# Patient Record
Sex: Male | Born: 1945 | Race: White | Hispanic: No | Marital: Married | Smoking: Current every day smoker
Health system: Southern US, Community
[De-identification: ages and names within clinical notes are randomized; demographics above are authoritative.]

## PROBLEM LIST (undated history)

## (undated) DIAGNOSIS — A0472 Enterocolitis due to Clostridium difficile, not specified as recurrent: Secondary | ICD-10-CM

## (undated) HISTORY — PX: EYE SURGERY: SHX253

---

## 2010-03-21 HISTORY — PX: CATARACT EXTRACTION W/PHACO: SHX586

## 2010-04-18 ENCOUNTER — Ambulatory Visit (HOSPITAL_COMMUNITY)
Admission: RE | Admit: 2010-04-18 | Discharge: 2010-04-18 | Payer: Self-pay | Source: Home / Self Care | Admitting: Ophthalmology

## 2010-05-21 DEATH — deceased

## 2010-10-03 LAB — BASIC METABOLIC PANEL
BUN: 8 mg/dL (ref 6–23)
Calcium: 8.9 mg/dL (ref 8.4–10.5)
Creatinine, Ser: 1.19 mg/dL (ref 0.4–1.5)
GFR calc Af Amer: >60 mL/min (ref >60)
GFR calc non Af Amer: >60 mL/min (ref >60)

## 2010-10-03 LAB — HEMOGLOBIN AND HEMATOCRIT, BLOOD: Hemoglobin: 15.5 g/dL (ref 13.0–17.0)

## 2013-02-15 ENCOUNTER — Encounter (HOSPITAL_COMMUNITY)
Admission: RE | Admit: 2013-02-15 | Discharge: 2013-02-15 | Disposition: A | Discharge status: Home / Self Care | Payer: Medicare Other | Source: Ambulatory Visit | Attending: Ophthalmology | Admitting: Ophthalmology

## 2013-02-15 ENCOUNTER — Encounter (HOSPITAL_COMMUNITY): Payer: Self-pay

## 2013-02-15 ENCOUNTER — Other Ambulatory Visit: Payer: Self-pay

## 2013-02-15 DIAGNOSIS — Z01810 Encounter for preprocedural cardiovascular examination: Secondary | ICD-10-CM | POA: Insufficient documentation

## 2013-02-15 DIAGNOSIS — Z01812 Encounter for preprocedural laboratory examination: Secondary | ICD-10-CM | POA: Insufficient documentation

## 2013-02-15 LAB — CBC
MCH: 32.9 pg (ref 26.0–34.0)
MCHC: 33.9 g/dL (ref 30.0–36.0)
Platelets: 217 10*3/uL (ref 150–400)
RDW: 13 % (ref 11.5–15.5)

## 2013-02-15 LAB — BASIC METABOLIC PANEL
Calcium: 9.4 mg/dL (ref 8.4–10.5)
GFR calc Af Amer: 65 mL/min — ABNORMAL LOW (ref >90)
GFR calc non Af Amer: 56 mL/min — ABNORMAL LOW (ref >90)
Glucose, Bld: 107 mg/dL — ABNORMAL HIGH (ref 70–99)
Sodium: 136 mEq/L (ref 135–145)

## 2013-02-15 NOTE — Patient Instructions (Addendum)
Michael Heath  02/15/2013   Your procedure is scheduled on:  02/24/2013  Report to [facility] at 12:15 AM.  Call this number if you have problems the morning of surgery: [phone]   Remember:   Do not eat food or drink liquids after midnight.   Take these medicines the morning of surgery with A SIP OF WATER: none   Do not wear jewelry, make-up or nail polish.  Do not wear lotions, powders, or perfumes. You may wear deodorant.  Do not shave 48 hours prior to surgery. Men may shave face and neck.  Do not bring valuables to the hospital.  [facility] is not responsible                   for any belongings or valuables.  Contacts, dentures or bridgework may not be worn into surgery.  Leave suitcase in the car. After surgery it may be brought to your room.  For patients admitted to the hospital, checkout time is 11:00 AM the day of  discharge.   Patients discharged the day of surgery will not be allowed to drive  home.  Name and phone number of your driver: [name]  Special Instructions: N/A   Please read over the following fact sheets that you were given: Care and Recovery After Surgery

## 2013-02-23 MED ORDER — TETRACAINE HCL 0.5 % OP SOLN
OPHTHALMIC | Status: AC
  Filled 2013-02-23: qty 2

## 2013-02-23 MED ORDER — LIDOCAINE HCL (PF) 1 % IJ SOLN
INTRAMUSCULAR | Status: AC
  Filled 2013-02-23: qty 2

## 2013-02-23 MED ORDER — PHENYLEPHRINE HCL 2.5 % OP SOLN
OPHTHALMIC | Status: AC
  Filled 2013-02-23: qty 2

## 2013-02-23 MED ORDER — CYCLOPENTOLATE-PHENYLEPHRINE 0.2-1 % OP SOLN
OPHTHALMIC | Status: AC
  Filled 2013-02-23: qty 2

## 2013-02-23 MED ORDER — LIDOCAINE HCL 3.5 % OP GEL
OPHTHALMIC | Status: AC
  Filled 2013-02-23: qty 5

## 2013-02-24 ENCOUNTER — Ambulatory Visit (HOSPITAL_COMMUNITY): Payer: Medicare Other | Admitting: Anesthesiology

## 2013-02-24 ENCOUNTER — Encounter (HOSPITAL_COMMUNITY)
Admission: RE | Disposition: A | Discharge status: Home Health | Payer: Self-pay | Source: Ambulatory Visit | Attending: Ophthalmology

## 2013-02-24 ENCOUNTER — Ambulatory Visit (HOSPITAL_COMMUNITY)
Admission: RE | Admit: 2013-02-24 | Discharge: 2013-02-24 | Disposition: A | Discharge status: Home Health | Payer: Medicare Other | Source: Ambulatory Visit | Attending: Ophthalmology | Admitting: Ophthalmology

## 2013-02-24 ENCOUNTER — Encounter (HOSPITAL_COMMUNITY): Payer: Self-pay | Admitting: *Deleted

## 2013-02-24 ENCOUNTER — Encounter (HOSPITAL_COMMUNITY): Payer: Self-pay | Admitting: Anesthesiology

## 2013-02-24 DIAGNOSIS — H52229 Regular astigmatism, unspecified eye: Secondary | ICD-10-CM | POA: Insufficient documentation

## 2013-02-24 DIAGNOSIS — H2510 Age-related nuclear cataract, unspecified eye: Secondary | ICD-10-CM | POA: Insufficient documentation

## 2013-02-24 HISTORY — PX: CATARACT EXTRACTION W/PHACO: SHX586

## 2013-02-24 SURGERY — PHACOEMULSIFICATION, CATARACT, WITH IOL INSERTION
Anesthesia: Monitor Anesthesia Care | Site: Eye | Laterality: Left | Wound class: Clean

## 2013-02-24 MED ORDER — MIDAZOLAM HCL 2 MG/2ML IJ SOLN
1.0000 mg | INTRAMUSCULAR | Status: DC | PRN
  Administered 2013-02-24: 2 mg via INTRAVENOUS

## 2013-02-24 MED ORDER — EPINEPHRINE HCL 1 MG/ML IJ SOLN
INTRAOCULAR | Status: DC | PRN
  Administered 2013-02-24: 14:00:00

## 2013-02-24 MED ORDER — NEOMYCIN-POLYMYXIN-DEXAMETH 3.5-10000-0.1 OP OINT
TOPICAL_OINTMENT | OPHTHALMIC | Status: AC
  Filled 2013-02-24: qty 3.5

## 2013-02-24 MED ORDER — POVIDONE-IODINE 5 % OP SOLN
OPHTHALMIC | Status: DC | PRN
  Administered 2013-02-24: 1 via OPHTHALMIC

## 2013-02-24 MED ORDER — FENTANYL CITRATE 0.05 MG/ML IJ SOLN
25.0000 ug | INTRAMUSCULAR | Status: AC
  Administered 2013-02-24 (×2): 25 ug via INTRAVENOUS

## 2013-02-24 MED ORDER — MIDAZOLAM HCL 2 MG/2ML IJ SOLN
INTRAMUSCULAR | Status: AC
  Filled 2013-02-24: qty 2

## 2013-02-24 MED ORDER — NEOMYCIN-POLYMYXIN-DEXAMETH 0.1 % OP OINT
TOPICAL_OINTMENT | OPHTHALMIC | Status: DC | PRN
  Administered 2013-02-24: 1 via OPHTHALMIC

## 2013-02-24 MED ORDER — CYCLOPENTOLATE-PHENYLEPHRINE 0.2-1 % OP SOLN
1.0000 [drp] | OPHTHALMIC | Status: AC
  Administered 2013-02-24 (×2): 1 [drp] via OPHTHALMIC

## 2013-02-24 MED ORDER — TETRACAINE HCL 0.5 % OP SOLN
OPHTHALMIC | Status: DC | PRN
  Administered 2013-02-24: 2 [drp] via OPHTHALMIC

## 2013-02-24 MED ORDER — LACTATED RINGERS IV SOLN
INTRAVENOUS | Status: DC
  Administered 2013-02-24: 13:00:00 via INTRAVENOUS

## 2013-02-24 MED ORDER — FENTANYL CITRATE 0.05 MG/ML IJ SOLN
INTRAMUSCULAR | Status: AC
  Filled 2013-02-24: qty 2

## 2013-02-24 MED ORDER — LIDOCAINE HCL (PF) 1 % IJ SOLN
INTRAMUSCULAR | Status: DC | PRN
  Administered 2013-02-24: .4 mL

## 2013-02-24 MED ORDER — BSS IO SOLN
INTRAOCULAR | Status: DC | PRN
  Administered 2013-02-24: 15 mL via INTRAOCULAR

## 2013-02-24 MED ORDER — EPINEPHRINE HCL 1 MG/ML IJ SOLN
INTRAMUSCULAR | Status: AC
  Filled 2013-02-24: qty 1

## 2013-02-24 MED ORDER — PROVISC 10 MG/ML IO SOLN
INTRAOCULAR | Status: DC | PRN
  Administered 2013-02-24: 8.5 mg via INTRAOCULAR

## 2013-02-24 MED ORDER — PHENYLEPHRINE HCL 2.5 % OP SOLN
1.0000 [drp] | OPHTHALMIC | Status: AC
  Administered 2013-02-24 (×3): 1 [drp] via OPHTHALMIC

## 2013-02-24 MED ORDER — LIDOCAINE HCL 3.5 % OP GEL: 1.0000 "application " | OPHTHALMIC | Status: AC

## 2013-02-24 MED ORDER — TETRACAINE HCL 0.5 % OP SOLN
1.0000 [drp] | OPHTHALMIC | Status: AC
  Administered 2013-02-24 (×3): 1 [drp] via OPHTHALMIC

## 2013-02-24 SURGICAL SUPPLY — 32 items
CAPSULAR TENSION RING-AMO (OPHTHALMIC RELATED) IMPLANT
CLOTH BEACON ORANGE TIMEOUT ST (SAFETY) ×2 IMPLANT
EYE SHIELD UNIVERSAL CLEAR (GAUZE/BANDAGES/DRESSINGS) ×2 IMPLANT
GLOVE BIO SURGEON STRL SZ 6.5 (GLOVE) IMPLANT
GLOVE BIOGEL PI IND STRL 6.5 (GLOVE) ×1 IMPLANT
GLOVE BIOGEL PI IND STRL 7.0 (GLOVE) IMPLANT
GLOVE BIOGEL PI IND STRL 7.5 (GLOVE) IMPLANT
GLOVE BIOGEL PI INDICATOR 6.5 (GLOVE) ×1
GLOVE BIOGEL PI INDICATOR 7.0 (GLOVE)
GLOVE BIOGEL PI INDICATOR 7.5 (GLOVE)
GLOVE ECLIPSE 6.5 STRL STRAW (GLOVE) IMPLANT
GLOVE ECLIPSE 7.0 STRL STRAW (GLOVE) IMPLANT
GLOVE ECLIPSE 7.5 STRL STRAW (GLOVE) IMPLANT
GLOVE EXAM NITRILE LRG STRL (GLOVE) IMPLANT
GLOVE EXAM NITRILE MD LF STRL (GLOVE) ×2 IMPLANT
GLOVE SKINSENSE NS SZ6.5 (GLOVE)
GLOVE SKINSENSE NS SZ7.0 (GLOVE)
GLOVE SKINSENSE STRL SZ6.5 (GLOVE) IMPLANT
GLOVE SKINSENSE STRL SZ7.0 (GLOVE) IMPLANT
KIT VITRECTOMY (OPHTHALMIC RELATED) IMPLANT
LENS SIGHTPATH STAAR TORIC ×2 IMPLANT
PAD ARMBOARD 7.5X6 YLW CONV (MISCELLANEOUS) ×2 IMPLANT
PROC W NO LENS (INTRAOCULAR LENS)
PROC W SPEC LENS (INTRAOCULAR LENS) ×2
PROCESS W NO LENS (INTRAOCULAR LENS) IMPLANT
PROCESS W SPEC LENS (INTRAOCULAR LENS) ×1 IMPLANT
RING MALYGIN (MISCELLANEOUS) IMPLANT
SYR TB 1ML LL NO SAFETY (SYRINGE) ×2 IMPLANT
TAPE SURG TRANSPORE 1 IN (GAUZE/BANDAGES/DRESSINGS) ×1 IMPLANT
TAPE SURGICAL TRANSPORE 1 IN (GAUZE/BANDAGES/DRESSINGS) ×1
VISCOELASTIC ADDITIONAL (OPHTHALMIC RELATED) IMPLANT
WATER STERILE IRR 250ML POUR (IV SOLUTION) ×2 IMPLANT

## 2013-02-24 NOTE — Anesthesia Postprocedure Evaluation (Signed)
  Anesthesia Post-op Note  Patient: Michael Heath  Procedure(s) Performed: Procedure(s) (LRB): CATARACT EXTRACTION PHACO AND INTRAOCULAR LENS PLACEMENT (IOC) (Left)  Patient Location:  Short Stay  Anesthesia Type: MAC  Level of Consciousness: awake  Airway and Oxygen Therapy: Patient Spontanous Breathing  Post-op Pain: none  Post-op Assessment: Post-op Vital signs reviewed, Patient's Cardiovascular Status Stable, Respiratory Function Stable, Patent Airway, No signs of Nausea or vomiting and Pain level controlled  Post-op Vital Signs: Reviewed and stable  Complications: No apparent anesthesia complications

## 2013-02-24 NOTE — Op Note (Signed)
Date of Admission: 02/24/2013  Date of Surgery: 02/24/2013  Pre-Op Dx: Cataract  Right  Eye  Post-Op Dx: Cataract  Right  Eye,  Dx Code 366.16, astigmatism Dx code 2790071807  Surgeon: Gemma Payor, M.D.  Assistants: None  Anesthesia: Topical with MAC  Indications: Painless, progressive loss of vision with compromise of daily activities.  Surgery: Cataract Extraction with Intraocular lens Implant Right Eye  Discription: The patient had dilating drops and viscous lidocaine placed into the left eye in the pre-op holding area. After transfer to the operating room, a time out was performed. The patient was then prepped and draped. Beginning with a 75 degree blade a paracentesis port was made at the surgeon's 2 o'clock position. The anterior chamber was then filled with 1% non-preserved lidocaine. This was followed by filling the anterior chamber with Provisc. A bent cystatome needle was used to create a continuous tear capsulotomy. Hydrodissection was performed with balanced salt solution on a Fine canula. The lens nucleus was then removed using the phacoemulsification handpiece. Residual cortex was removed with the I&A handpiece. The anterior chamber and capsular bag were refilled with Provisc. A posterior chamber intraocular lens was placed into the capsular bag with it's injector. The implant was positioned with the Kuglan hook. The Provisc was then removed from the anterior chamber and capsular bag with the I&A handpiece. Stromal hydration of the main incision and paracentesis port was performed with BSS on a Fine canula. The wounds were tested for leak which was negative. The patient tolerated the procedure well. There were no operative complications. The patient was then transferred to the recovery room in stable condition.  Complications: None  Specimen: None  EBL: None  Prosthetic device: STAAR Toric [id number], power 16.0SE/+2.0, SN [id number].

## 2013-02-24 NOTE — H&P (Signed)
I have reviewed the H&P, the patient was re-examined, and I have identified no interval changes in medical condition and plan of care since the history and physical of record  

## 2013-02-24 NOTE — Anesthesia Procedure Notes (Signed)
Procedure Name: MAC Date/Time: 02/24/2013 1:30 PM Performed by: Franco Nones Pre-anesthesia Checklist: Patient identified, Emergency Drugs available, Suction available, Timeout performed and Patient being monitored Patient Re-evaluated:Patient Re-evaluated prior to inductionOxygen Delivery Method: Nasal Cannula

## 2013-02-24 NOTE — Anesthesia Preprocedure Evaluation (Signed)
Anesthesia Evaluation  Patient identified by MRN, date of birth, ID band Patient awake    Reviewed: Allergy & Precautions, H&P , NPO status , Patient's Chart, lab work & pertinent test results  Airway Mallampati: III      Dental  (+) Teeth Intact   Pulmonary Current Smoker (am cough),  breath sounds clear to auscultation        Cardiovascular negative cardio ROS  Rhythm:Regular Rate:Normal     Neuro/Psych    GI/Hepatic negative GI ROS,   Endo/Other    Renal/GU      Musculoskeletal   Abdominal   Peds  Hematology   Anesthesia Other Findings   Reproductive/Obstetrics                           Anesthesia Physical Anesthesia Plan  ASA: II  Anesthesia Plan: MAC   Post-op Pain Management:    Induction: Intravenous  Airway Management Planned: Nasal Cannula  Additional Equipment:   Intra-op Plan:   Post-operative Plan:   Informed Consent: I have reviewed the patients History and Physical, chart, labs and discussed the procedure including the risks, benefits and alternatives for the proposed anesthesia with the patient or authorized representative who has indicated his/her understanding and acceptance.     Plan Discussed with:   Anesthesia Plan Comments:         Anesthesia Quick Evaluation

## 2013-02-24 NOTE — Transfer of Care (Signed)
Immediate Anesthesia Transfer of Care Note  Patient: Michael Heath  Procedure(s) Performed: Procedure(s) (LRB): CATARACT EXTRACTION PHACO AND INTRAOCULAR LENS PLACEMENT (IOC) (Left)  Patient Location: Shortstay  Anesthesia Type: MAC  Level of Consciousness: awake  Airway & Oxygen Therapy: Patient Spontanous Breathing   Post-op Assessment: Report given to PACU RN, Post -op Vital signs reviewed and stable and Patient moving all extremities  Post vital signs: Reviewed and stable  Complications: No apparent anesthesia complications

## 2013-02-28 ENCOUNTER — Encounter (HOSPITAL_COMMUNITY): Payer: Self-pay | Admitting: Ophthalmology

## 2014-10-20 DEATH — deceased

## 2015-12-10 DIAGNOSIS — H5202 Hypermetropia, left eye: Secondary | ICD-10-CM | POA: Diagnosis not present

## 2015-12-10 DIAGNOSIS — H524 Presbyopia: Secondary | ICD-10-CM | POA: Diagnosis not present

## 2015-12-10 DIAGNOSIS — Z961 Presence of intraocular lens: Secondary | ICD-10-CM | POA: Diagnosis not present

## 2015-12-10 DIAGNOSIS — H52223 Regular astigmatism, bilateral: Secondary | ICD-10-CM | POA: Diagnosis not present

## 2015-12-25 ENCOUNTER — Encounter: Payer: Self-pay | Admitting: Family Medicine

## 2015-12-25 ENCOUNTER — Ambulatory Visit (INDEPENDENT_AMBULATORY_CARE_PROVIDER_SITE_OTHER): Payer: Medicare Other | Admitting: Family Medicine

## 2015-12-25 VITALS — BP 153/98 | HR 64 | Temp 97.0°F | Ht 73.0 in | Wt 221.0 lb

## 2015-12-25 DIAGNOSIS — Z0000 Encounter for general adult medical examination without abnormal findings: Secondary | ICD-10-CM

## 2015-12-25 DIAGNOSIS — R030 Elevated blood-pressure reading, without diagnosis of hypertension: Secondary | ICD-10-CM | POA: Insufficient documentation

## 2015-12-25 DIAGNOSIS — IMO0001 Reserved for inherently not codable concepts without codable children: Secondary | ICD-10-CM

## 2015-12-25 DIAGNOSIS — E669 Obesity, unspecified: Secondary | ICD-10-CM

## 2015-12-25 NOTE — Patient Instructions (Signed)
Great to see you!  Come back in 6 months unless we let you know otherwise  We will call within 1 week with labs

## 2015-12-25 NOTE — Progress Notes (Signed)
Subjective:    Michael Heath is a 70 y.o. male who presents for Medicare Annual/Subsequent preventive examination.   Preventive Screening-Counseling & Management  Tobacco History  Smoking status  . Current Every Day Smoker -- 1.50 packs/day for 55 years  . Types: Cigarettes  Smokeless tobacco  . Current User    Problems Prior to Visit 1. See problem list  Current Problems (verified) Patient Active Problem List   Diagnosis Date Noted  . Elevated blood pressure 12/25/2015    Medications Prior to Visit No current outpatient prescriptions on file prior to visit.   No current facility-administered medications on file prior to visit.    Current Medications (verified) No current outpatient prescriptions on file.   No current facility-administered medications for this visit.     Allergies (verified) Review of patient's allergies indicates no known allergies.   PAST HISTORY  Family History History reviewed. No pertinent family history.  Social History Social History  Substance Use Topics  . Smoking status: Current Every Day Smoker -- 1.50 packs/day for 55 years    Types: Cigarettes  . Smokeless tobacco: Current User  . Alcohol Use: No    Are there smokers in your home (other than you)?  Yes  Risk Factors Current exercise habits: walk 2-3 times per week , 20 minutes  Dietary issues discussed: Difficulty swallowing ceartain foods and ceartain spices  Cardiac risk factors: advanced age (older than 96 for men, 56 for women), male gender and smoking/ tobacco exposure.  Depression Screen (Note: if answer to either of the following is "Yes", a more complete depression screening is indicated)   Q1: Over the past two weeks, have you felt down, depressed or hopeless? No  Q2: Over the past two weeks, have you felt little interest or pleasure in doing things? No  Have you lost interest or pleasure in daily life? No  Do you often feel hopeless? No  Do you cry easily  over simple problems? No  Activities of Daily Living In your present state of health, do you have any difficulty performing the following activities?:  Driving? No Managing money?  No Feeding yourself? No Getting from bed to chair? No Climbing a flight of stairs? No Preparing food and eating?: No Bathing or showering? No Getting dressed: No Getting to the toilet? No Using the toilet:No Moving around from place to place: No In the past year have you fallen or had a near fall?:No   Are you sexually active?  No  Do you have more than one partner?  No  Hearing Difficulties: No Do you often ask people to speak up or repeat themselves? No Do you experience ringing or noises in your ears? No Do you have difficulty understanding soft or whispered voices? No   Do you feel that you have a problem with memory? No  Do you often misplace items? yes  Do you feel safe at home?  Yes  Cognitive Testing  Alert? Yes  Normal Appearance?Yes  Oriented to person? Yes  Place? Yes   Time? Yes  Recall of three objects?  Yes  Can perform simple calculations? Yes  Displays appropriate judgment?Yes  Can read the correct time from a watch face?Yes   Advanced Directives have been discussed with the patient? Yes   List the Names of Other Physician/Practitioners you currently use: 1.  [facility]  Indicate any recent Medical Services you may have received from other than [facility] providers in the past year (  date may be approximate).   There is no immunization history on file for this patient.  Screening Tests Health Maintenance  Topic Date Due  . Hepatitis C Screening  [birth year]  . TETANUS/TDAP  07/10/1964  . COLONOSCOPY  07/11/1995  . ZOSTAVAX  07/10/2005  . PNA vac Low Risk Adult (1 of 2 - PCV13) 07/10/2010  . INFLUENZA VACCINE  02/19/2016    All answers were reviewed with the patient and necessary referrals were made:  Kevin FentonSamuel Deloris Moger, MD   12/25/2015   History  reviewed: allergies, current medications, past family history, past medical history, past social history, past surgical history and problem list  Review of Systems Pertinent items are noted in HPI.    Objective:      Blood pressure 153/98, pulse 64, temperature 97 F (36.1 C), temperature source Oral, height 6\' 1"  (1.854 m), weight 221 lb (100.245 kg). Body mass index is 29.16 kg/(m^2).  Gen: NAD, alert, cooperative with exam HEENT: NCAT, EOMI, PERRL CV: RRR, good S1/S2, no murmur Resp: CTABL, no wheezes, non-labored  Ext: No edema, warm Neuro: Alert and oriented, No gross deficits      Assessment:     Michael Heath is a very pleasant 70 year old male with elevated blood pressure and no diagnosis of hypertension. His BMI is borderline obesity, technically overweight  We discussed diet and exercise. I recommended routine blood work today, he appears to be in very good health and has plenty of potential life span to warrant the use of statins if he were found to have high cholesterol.  He would like to defer colonoscopy for now, FOBT card is been given. He also declines pneumonia vaccination.  He is not fasting today      Plan:     During the course of the visit the patient was educated and counseled about appropriate screening and preventive services including:    Smoking cessation counseling  Diet review for nutrition referral? Yes ____  Not Indicated __X   Patient Instructions (the written plan) was given to the patient.  Medicare Attestation I have personally reviewed: The patient's medical and social history Their use of alcohol, tobacco or illicit drugs Their current medications and supplements The patient's functional ability including ADLs,fall risks, home safety risks, cognitive, and hearing and visual impairment Diet and physical activities Evidence for depression or mood disorders  The patient's weight, height, BMI, and visual acuity have been recorded  in the chart.  I have made referrals, counseling, and provided education to the patient based on review of the above and I have provided the patient with a written personalized care plan for preventive services.     Kevin FentonSamuel Areyanna Figeroa, MD   12/25/2015

## 2015-12-26 LAB — CMP14+EGFR
A/G RATIO: 1.2 (ref 1.2–2.2)
ALBUMIN: 4.2 g/dL (ref 3.5–4.8)
ALT: 21 IU/L (ref 0–44)
AST: 19 IU/L (ref 0–40)
Alkaline Phosphatase: 67 IU/L (ref 39–117)
BUN / CREAT RATIO: 10 (ref 10–24)
BUN: 13 mg/dL (ref 8–27)
Bilirubin Total: 0.4 mg/dL (ref 0.0–1.2)
CALCIUM: 9.4 mg/dL (ref 8.6–10.2)
CO2: 24 mmol/L (ref 18–29)
Chloride: 98 mmol/L (ref 96–106)
Creatinine, Ser: 1.32 mg/dL — ABNORMAL HIGH (ref 0.76–1.27)
GFR, EST AFRICAN AMERICAN: 63 mL/min/{1.73_m2} (ref >59)
GFR, EST NON AFRICAN AMERICAN: 54 mL/min/{1.73_m2} — AB (ref >59)
GLOBULIN, TOTAL: 3.6 g/dL (ref 1.5–4.5)
Glucose: 99 mg/dL (ref 65–99)
POTASSIUM: 4.3 mmol/L (ref 3.5–5.2)
SODIUM: 136 mmol/L (ref 134–144)
Total Protein: 7.8 g/dL (ref 6.0–8.5)

## 2015-12-26 LAB — CBC WITH DIFFERENTIAL/PLATELET
BASOS: 0 %
Basophils Absolute: 0 10*3/uL (ref 0.0–0.2)
EOS (ABSOLUTE): 0.4 10*3/uL (ref 0.0–0.4)
EOS: 4 %
HEMATOCRIT: 50 % (ref 37.5–51.0)
Hemoglobin: 16.8 g/dL (ref 12.6–17.7)
Immature Grans (Abs): 0 10*3/uL (ref 0.0–0.1)
Immature Granulocytes: 0 %
LYMPHS ABS: 2.3 10*3/uL (ref 0.7–3.1)
Lymphs: 23 %
MCH: 31.4 pg (ref 26.6–33.0)
MCHC: 33.6 g/dL (ref 31.5–35.7)
MCV: 94 fL (ref 79–97)
MONOS ABS: 0.9 10*3/uL (ref 0.1–0.9)
Monocytes: 9 %
NEUTROS ABS: 6.4 10*3/uL (ref 1.4–7.0)
NEUTROS PCT: 64 %
PLATELETS: 241 10*3/uL (ref 150–379)
RBC: 5.35 x10E6/uL (ref 4.14–5.80)
RDW: 13.2 % (ref 12.3–15.4)
WBC: 10 10*3/uL (ref 3.4–10.8)

## 2015-12-26 LAB — LIPID PANEL
CHOLESTEROL TOTAL: 223 mg/dL — AB (ref 100–199)
Chol/HDL Ratio: 8 ratio units — ABNORMAL HIGH (ref 0.0–5.0)
HDL: 28 mg/dL — ABNORMAL LOW (ref >39)
LDL CALC: 134 mg/dL — AB (ref 0–99)
TRIGLYCERIDES: 305 mg/dL — AB (ref 0–149)
VLDL Cholesterol Cal: 61 mg/dL — ABNORMAL HIGH (ref 5–40)

## 2015-12-26 LAB — TSH: TSH: 0.668 u[IU]/mL (ref 0.450–4.500)

## 2015-12-26 LAB — VITAMIN D 25 HYDROXY (VIT D DEFICIENCY, FRACTURES): Vit D, 25-Hydroxy: 10.8 ng/mL — ABNORMAL LOW (ref 30.0–100.0)

## 2015-12-26 LAB — HEPATITIS C ANTIBODY: Hep C Virus Ab: <0.1 s/co ratio (ref 0.0–0.9)

## 2015-12-27 ENCOUNTER — Other Ambulatory Visit: Payer: Self-pay | Admitting: Family Medicine

## 2015-12-27 ENCOUNTER — Other Ambulatory Visit: Payer: Self-pay

## 2015-12-27 ENCOUNTER — Other Ambulatory Visit: Payer: Medicare Other

## 2015-12-27 ENCOUNTER — Telehealth: Payer: Self-pay | Admitting: Family Medicine

## 2015-12-27 DIAGNOSIS — Z1211 Encounter for screening for malignant neoplasm of colon: Secondary | ICD-10-CM

## 2015-12-27 DIAGNOSIS — E559 Vitamin D deficiency, unspecified: Secondary | ICD-10-CM | POA: Insufficient documentation

## 2015-12-27 DIAGNOSIS — E785 Hyperlipidemia, unspecified: Secondary | ICD-10-CM

## 2015-12-27 MED ORDER — VITAMIN D (ERGOCALCIFEROL) 1.25 MG (50000 UNIT) PO CAPS: 50000.0000 [IU] | ORAL_CAPSULE | ORAL | Status: DC

## 2015-12-27 NOTE — Telephone Encounter (Signed)
Wife wanted to talk about husbands labs.

## 2015-12-31 LAB — FECAL OCCULT BLOOD, IMMUNOCHEMICAL: Fecal Occult Bld: NEGATIVE

## 2016-01-28 ENCOUNTER — Encounter: Payer: Self-pay | Admitting: Family Medicine

## 2016-01-28 ENCOUNTER — Ambulatory Visit (INDEPENDENT_AMBULATORY_CARE_PROVIDER_SITE_OTHER): Payer: Medicare Other | Admitting: Family Medicine

## 2016-01-28 VITALS — BP 139/92 | HR 78 | Temp 96.9°F | Ht 73.0 in | Wt 219.8 lb

## 2016-01-28 DIAGNOSIS — E785 Hyperlipidemia, unspecified: Secondary | ICD-10-CM

## 2016-01-28 DIAGNOSIS — IMO0001 Reserved for inherently not codable concepts without codable children: Secondary | ICD-10-CM

## 2016-01-28 DIAGNOSIS — N183 Chronic kidney disease, stage 3 unspecified: Secondary | ICD-10-CM | POA: Insufficient documentation

## 2016-01-28 DIAGNOSIS — E559 Vitamin D deficiency, unspecified: Secondary | ICD-10-CM

## 2016-01-28 DIAGNOSIS — R030 Elevated blood-pressure reading, without diagnosis of hypertension: Secondary | ICD-10-CM

## 2016-01-28 MED ORDER — ATORVASTATIN CALCIUM 20 MG PO TABS: 20.0000 mg | ORAL_TABLET | Freq: Every day | ORAL | Status: AC

## 2016-01-28 MED ORDER — ASPIRIN EC 81 MG PO TBEC: 81.0000 mg | DELAYED_RELEASE_TABLET | Freq: Every day | ORAL | Status: AC

## 2016-01-28 NOTE — Progress Notes (Signed)
   HPI  Patient presents today here to discuss cholesterol, vitamin D, and elevated blood pressure.  Previous cholesterol test had elevated cholesterol, ASCVD risk or in 10 years was 37.5%/13%. He has cut back drastically on the amount of fried and fatty foods and he is eating. He is open to taking Lipitor.  Vitamin D Taking weekly supplement PLans on taking daily supplement after he is finished.  Smokes about 1.5 ppd- not wanting to quit  PMH: Smoking status noted ROS: Per HPI  Objective: BP 139/92 mmHg  Pulse 78  Temp(Src) 96.9 F (36.1 C) (Oral)  Ht 6\' 1"  (1.854 m)  Wt 219 lb 12.8 oz (99.701 kg)  BMI 29.01 kg/m2 Gen: NAD, alert, cooperative with exam HEENT: NCAT CV: RRR, good S1/S2, no murmur Resp: CTABL, no wheezes, non-labored Ext: No edema, warm Neuro: Alert and oriented, No gross deficits  Assessment and plan:  # HLD 37.5%/13% 10 year ASCVD risk score, starting statin Congratulated on therapeutic lifestyle changes Re-check Lipid and CMP in 3 months  # vitamin D def Repeat next visit Replaced weekly X 8 weeks then daily - reviewed and he underetands  # Elevated BP Borderline, monitor closely Start daily Asa for multiple cardiac risk factors  CKD-3 Discussed, avoid NSAIDs  Meds ordered this encounter  Medications  . atorvastatin (LIPITOR) 20 MG tablet    Sig: Take 1 tablet (20 mg total) by mouth daily.    Dispense:  90 tablet    Refill:  3  . aspirin EC 81 MG tablet    Sig: Take 1 tablet (81 mg total) by mouth daily.    Murtis SinkSam Kadience Macchi, MD [facility] 01/28/2016, 1:21 PM

## 2016-05-01 ENCOUNTER — Encounter: Payer: Self-pay | Admitting: Family Medicine

## 2016-05-01 ENCOUNTER — Ambulatory Visit (INDEPENDENT_AMBULATORY_CARE_PROVIDER_SITE_OTHER): Payer: Medicare Other | Admitting: Family Medicine

## 2016-05-01 VITALS — BP 127/85 | HR 71 | Temp 97.0°F | Ht 73.0 in | Wt 221.0 lb

## 2016-05-01 DIAGNOSIS — E785 Hyperlipidemia, unspecified: Secondary | ICD-10-CM

## 2016-05-01 DIAGNOSIS — R030 Elevated blood-pressure reading, without diagnosis of hypertension: Secondary | ICD-10-CM

## 2016-05-01 DIAGNOSIS — Z23 Encounter for immunization: Secondary | ICD-10-CM

## 2016-05-01 DIAGNOSIS — E559 Vitamin D deficiency, unspecified: Secondary | ICD-10-CM

## 2016-05-01 NOTE — Progress Notes (Signed)
   HPI  Patient presents today follow-up of hyperlipidemia and other chronic medical conditions.  HLD tolerationg Lipitor without side effect, watching his diet with decreased fried and fatty foods. Recreationally active [place]  Elevated BP- diet improved, no ches tpain or ddysonea  Vitamin D Feels well Taking 2000 IU daily  PMH: Smoking status noted ROS: Per HPI  Objective: BP 127/85   Pulse 71   Temp 97 F (36.1 C) (Oral)   Ht _0  (1.854 m)   Wt 221 lb (100.2 kg)   BMI 29.16 kg/m  Gen: NAD, alert, cooperative with exam HEENT: NCAT CV: RRR, good S1/S2, no murmur Resp: CTABL, no wheezes, non-labored Ext: No edema, warm Neuro: Alert and oriented  Assessment and plan:  # HLD Repeating labs, likely improving Tolerating lipitor, continue  # Elevated BP w/o Dx of HTN Improved today, continue to monitor closely  # Vitamin D def Replacing consistently, re-checking vitamin d levels  Influenza vaccine given today-counseling provided   Orders Placed This Encounter  Procedures  . VITAMIN D 25 Hydroxy (Vit-D Deficiency, Fractures)  . CMP14+EGFR  . Lipid panel  . CBC with [place], MD [facility] 05/01/2016, 10:00 AM

## 2016-05-01 NOTE — Patient Instructions (Signed)
Great to see you!  We will call with lab results within 1 week   

## 2016-05-02 ENCOUNTER — Other Ambulatory Visit: Payer: Self-pay | Admitting: Family Medicine

## 2016-05-02 LAB — CMP14+EGFR
ALT: 23 IU/L (ref 0–44)
AST: 17 IU/L (ref 0–40)
Albumin/Globulin Ratio: 1.1 — ABNORMAL LOW (ref 1.2–2.2)
Albumin: 4.1 g/dL (ref 3.5–4.8)
Alkaline Phosphatase: 61 IU/L (ref 39–117)
BUN/Creatinine Ratio: 8 — ABNORMAL LOW (ref 10–24)
BUN: 10 mg/dL (ref 8–27)
Bilirubin Total: 0.5 mg/dL (ref 0.0–1.2)
CALCIUM: 9.1 mg/dL (ref 8.6–10.2)
CO2: 24 mmol/L (ref 18–29)
Chloride: 100 mmol/L (ref 96–106)
Creatinine, Ser: 1.27 mg/dL (ref 0.76–1.27)
GFR, EST AFRICAN AMERICAN: 66 mL/min/{1.73_m2} (ref >59)
GFR, EST NON AFRICAN AMERICAN: 57 mL/min/{1.73_m2} — AB (ref >59)
GLUCOSE: 116 mg/dL — AB (ref 65–99)
Globulin, Total: 3.8 g/dL (ref 1.5–4.5)
Potassium: 4.4 mmol/L (ref 3.5–5.2)
Sodium: 138 mmol/L (ref 134–144)
TOTAL PROTEIN: 7.9 g/dL (ref 6.0–8.5)

## 2016-05-02 LAB — CBC WITH DIFFERENTIAL/PLATELET
BASOS ABS: 0.1 10*3/uL (ref 0.0–0.2)
Basos: 1 %
EOS (ABSOLUTE): 0.6 10*3/uL — AB (ref 0.0–0.4)
Eos: 6 %
HEMOGLOBIN: 16.1 g/dL (ref 12.6–17.7)
Hematocrit: 47.6 % (ref 37.5–51.0)
Immature Grans (Abs): 0 10*3/uL (ref 0.0–0.1)
Immature Granulocytes: 0 %
LYMPHS ABS: 2.6 10*3/uL (ref 0.7–3.1)
Lymphs: 25 %
MCH: 31.6 pg (ref 26.6–33.0)
MCHC: 33.8 g/dL (ref 31.5–35.7)
MCV: 93 fL (ref 79–97)
MONOCYTES: 6 %
Monocytes Absolute: 0.7 10*3/uL (ref 0.1–0.9)
NEUTROS ABS: 6.7 10*3/uL (ref 1.4–7.0)
Neutrophils: 62 %
Platelets: 230 10*3/uL (ref 150–379)
RBC: 5.1 x10E6/uL (ref 4.14–5.80)
RDW: 13 % (ref 12.3–15.4)
WBC: 10.6 10*3/uL (ref 3.4–10.8)

## 2016-05-02 LAB — LIPID PANEL
CHOL/HDL RATIO: 5.1 ratio — AB (ref 0.0–5.0)
Cholesterol, Total: 152 mg/dL (ref 100–199)
HDL: 30 mg/dL — AB (ref >39)
LDL Calculated: 78 mg/dL (ref 0–99)
Triglycerides: 220 mg/dL — ABNORMAL HIGH (ref 0–149)
VLDL CHOLESTEROL CAL: 44 mg/dL — AB (ref 5–40)

## 2016-05-02 LAB — VITAMIN D 25 HYDROXY (VIT D DEFICIENCY, FRACTURES): VIT D 25 HYDROXY: 21.8 ng/mL — AB (ref 30.0–100.0)

## 2016-05-02 MED ORDER — VITAMIN D (ERGOCALCIFEROL) 1.25 MG (50000 UNIT) PO CAPS: 50000.0000 [IU] | ORAL_CAPSULE | ORAL | 0 refills | Status: DC

## 2016-08-25 ENCOUNTER — Telehealth: Payer: Self-pay | Admitting: Family Medicine

## 2016-08-25 ENCOUNTER — Encounter: Payer: Self-pay | Admitting: Family Medicine

## 2016-08-25 ENCOUNTER — Ambulatory Visit (INDEPENDENT_AMBULATORY_CARE_PROVIDER_SITE_OTHER): Payer: Medicare Other | Admitting: Family Medicine

## 2016-08-25 ENCOUNTER — Ambulatory Visit (HOSPITAL_COMMUNITY)
Admission: RE | Admit: 2016-08-25 | Discharge: 2016-08-25 | Disposition: A | Discharge status: Home / Self Care | Payer: Medicare Other | Source: Ambulatory Visit | Attending: Family Medicine | Admitting: Family Medicine

## 2016-08-25 VITALS — BP 111/72 | HR 87 | Temp 98.2°F | Ht 73.0 in | Wt 222.6 lb

## 2016-08-25 DIAGNOSIS — E559 Vitamin D deficiency, unspecified: Secondary | ICD-10-CM | POA: Diagnosis not present

## 2016-08-25 DIAGNOSIS — N183 Chronic kidney disease, stage 3 unspecified: Secondary | ICD-10-CM

## 2016-08-25 DIAGNOSIS — G459 Transient cerebral ischemic attack, unspecified: Secondary | ICD-10-CM

## 2016-08-25 DIAGNOSIS — I6782 Cerebral ischemia: Secondary | ICD-10-CM | POA: Insufficient documentation

## 2016-08-25 DIAGNOSIS — J189 Pneumonia, unspecified organism: Secondary | ICD-10-CM

## 2016-08-25 DIAGNOSIS — R251 Tremor, unspecified: Secondary | ICD-10-CM | POA: Diagnosis not present

## 2016-08-25 DIAGNOSIS — J181 Lobar pneumonia, unspecified organism: Secondary | ICD-10-CM | POA: Diagnosis not present

## 2016-08-25 MED ORDER — VITAMIN D (ERGOCALCIFEROL) 1.25 MG (50000 UNIT) PO CAPS: 50000.0000 [IU] | ORAL_CAPSULE | ORAL | 0 refills | Status: AC

## 2016-08-25 MED ORDER — LEVOFLOXACIN 500 MG PO TABS: 500.0000 mg | ORAL_TABLET | Freq: Every day | ORAL | 0 refills | Status: DC

## 2016-08-25 NOTE — Addendum Note (Signed)
Addended by: Angela AdamOSTOSKY, Evertte Sones C on: 08/25/2016 01:38 PM   Modules accepted: Orders

## 2016-08-25 NOTE — Progress Notes (Signed)
   HPI  Patient presents today here after an episode of leg weakness.  Patient explains that he had right lower extremity weakness for several hours yesterday. He had 2 falls during this time and he called EMS. He fell once while standing up out of bed, he fell again in the bathtub. He denies any musculoskeletal pain and also denies hitting his head. He also denies any loss of consciousness.  She is not fasting today but would like blood work done.  His leg strength feels normal today.  He denies any history of CVA or MI.  PMH: Smoking status noted ROS: Per HPI  Objective: BP 111/72   Pulse 87   Temp 98.2 F (36.8 C) (Oral)   Ht _0  (1.854 m)   Wt 222 lb 9.6 oz (101 kg)   BMI 29.37 kg/m  Gen: NAD, alert, cooperative with exam HEENT: NCAT, EOMI, PERRL, oropharynx clear CV: RRR, good S1/S2, no murmur Resp: Scattered expiratory wheeze, left lower lung field with persistent coarse breath sounds Ext: No edema, warm Neuro: Alert and oriented, ring 5/5 in sensation intact in bilateral upper and lower extremities, hip flexors are 4/5 and symmetric, cranial nerves II through XII intact  Assessment and plan:  # TIA Symptoms most consistent with TIA given age and risk factors MRI stat to rule out acute CVA, however I am reassured that his leg strength has returned to normal Refer to neurology for full workup Continue statin and aspirin   # Vitamin D deficiency Severe Recheck levels  # CK D stage III Tylenol for fever if needed Avoid nephrotoxic agents Repeat labs  # Community acquired pneumonia Treat with Levaquin Patient was found to be febrile of 102 when the herpetic sore at his house He's had cough for about one day He denies any malaise or body aches    Orders Placed This Encounter  Procedures  . MR Brain Wo Contrast    Standing Status:   Future    Standing Expiration Date:   10/23/2017    Order Specific Question:   Reason for Exam (SYMPTOM  OR DIAGNOSIS  REQUIRED)    Answer:   R/O CVA, 24 hours ago had 12 hour episode of RLE weakness causing Fall X 2    Order Specific Question:   Preferred imaging location?    Answer:   [facility], [place] (table limit-350lbs)    Order Specific Question:   What is the patient's sedation requirement?    Answer:   No Sedation    Order Specific Question:   Does the patient have a pacemaker or implanted devices?    Answer:   No  . VITAMIN D 25 Hydroxy (Vit-D Deficiency, Fractures)  . LDL Cholesterol, Direct  . CMP14+EGFR  . Ambulatory referral to Neurology    Referral Priority:   Routine    Referral Type:   Consultation    Referral Reason:   Specialty Services Required    Requested Specialty:   Neurology    Number of Visits Requested:   1    Meds ordered this encounter  Medications  . levofloxacin (LEVAQUIN) 500 MG tablet    Sig: Take 1 tablet (500 mg total) by mouth daily.    Dispense:  7 tablet    Refill:  0    Laroy Apple, MD [facility] 08/25/2016, 1:32 PM

## 2016-08-25 NOTE — Patient Instructions (Addendum)
Great to see you!  Come back with any concerns  We will work on a referral to a neurologist.    Community-Acquired Pneumonia, Adult Pneumonia is an infection of the lungs. There are different types of pneumonia. One type can develop while a person is in a hospital. A different type, called community-acquired pneumonia, develops in people who are not, or have not recently been, in the hospital or other health care facility. What are the causes? Pneumonia may be caused by bacteria, viruses, or funguses. Community-acquired pneumonia is often caused by Streptococcus pneumonia bacteria. These bacteria are often passed from one person to another by breathing in droplets from the cough or sneeze of an infected person. What increases the risk? The condition is more likely to develop in:  People who havechronic diseases, such as chronic obstructive pulmonary disease (COPD), asthma, congestive heart failure, cystic fibrosis, diabetes, or kidney disease.  People who haveearly-stage or late-stage HIV.  People who havesickle cell disease.  People who havehad their spleen removed (splenectomy).  People who havepoor Administrator.  People who havemedical conditions that increase the risk of breathing in (aspirating) secretions their own mouth and nose.  People who havea weakened immune system (immunocompromised).  People who smoke.  People whotravel to areas where pneumonia-causing germs commonly exist.  People whoare around animal habitats or animals that have pneumonia-causing germs, including birds, bats, rabbits, cats, and farm animals. What are the signs or symptoms? Symptoms of this condition include:  Adry cough.  A wet (productive) cough.  Fever.  Sweating.  Chest pain, especially when breathing deeply or coughing.  Rapid breathing or difficulty breathing.  Shortness of breath.  Shaking chills.  Fatigue.  Muscle aches. How is this diagnosed? Your health  care provider will take a medical history and perform a physical exam. You may also have other tests, including:  Imaging studies of your chest, including X-rays.  Tests to check your blood oxygen level and other blood gases.  Other tests on blood, mucus (sputum), fluid around your lungs (pleural fluid), and urine. If your pneumonia is severe, other tests may be done to identify the specific cause of your illness. How is this treated? The type of treatment that you receive depends on many factors, such as the cause of your pneumonia, the medicines you take, and other medical conditions that you have. For most adults, treatment and recovery from pneumonia may occur at home. In some cases, treatment must happen in a hospital. Treatment may include:  Antibiotic medicines, if the pneumonia was caused by bacteria.  Antiviral medicines, if the pneumonia was caused by a virus.  Medicines that are given by mouth or through an IV tube.  Oxygen.  Respiratory therapy. Although rare, treating severe pneumonia may include:  Mechanical ventilation. This is done if you are not breathing well on your own and you cannot maintain a safe blood oxygen level.  Thoracentesis. This procedureremoves fluid around one lung or both lungs to help you breathe better. Follow these instructions at home:  Take over-the-counter and prescription medicines only as told by your health care provider.  Only takecough medicine if you are losing sleep. Understand that cough medicine can prevent your body's natural ability to remove mucus from your lungs.  If you were prescribed an antibiotic medicine, take it as told by your health care provider. Do not stop taking the antibiotic even if you start to feel better.  Sleep in a semi-upright position at night. Try sleeping in a reclining  chair, or place a few pillows under your head.  Do not use tobacco products, including cigarettes, chewing tobacco, and e-cigarettes. If  you need help quitting, ask your health care provider.  Drink enough water to keep your urine clear or pale yellow. This will help to thin out mucus secretions in your lungs. How is this prevented? There are ways that you can decrease your risk of developing community-acquired pneumonia. Consider getting a pneumococcal vaccine if:  You are older than 72 years of age.  You are older than 72 years of age and are undergoing cancer treatment, have chronic lung disease, or have other medical conditions that affect your immune system. Ask your health care provider if this applies to you. There are different types and schedules of pneumococcal vaccines. Ask your health care provider which vaccination option is best for you. You may also prevent community-acquired pneumonia if you take these actions:  Get an influenza vaccine every year. Ask your health care provider which type of influenza vaccine is best for you.  Go to the dentist on a regular basis.  Wash your hands often. Use hand sanitizer if soap and water are not available. Contact a health care provider if:  You have a fever.  You are losing sleep because you cannot control your cough with cough medicine. Get help right away if:  You have worsening shortness of breath.  You have increased chest pain.  Your sickness becomes worse, especially if you are an older adult or have a weakened immune system.  You cough up blood. This information is not intended to replace advice given to you by your health care provider. Make sure you discuss any questions you have with your health care provider. Document Released: 07/07/2005 Document Revised: 11/15/2015 Document Reviewed: 11/01/2014 Elsevier Interactive Patient Education  2017 [employer]

## 2016-08-25 NOTE — Telephone Encounter (Signed)
Attempted call, both numbers.   Will ask nursing to inform tomorrow- no CVA (pt understands that if negative would be released and he was).   Murtis SinkSam Bradshaw, MD [facility] 08/25/2016, 5:04 PM

## 2016-08-26 LAB — CMP14+EGFR
A/G RATIO: 1 — AB (ref 1.2–2.2)
ALT: 40 IU/L (ref 0–44)
AST: 67 IU/L — AB (ref 0–40)
Albumin: 3.8 g/dL (ref 3.5–4.8)
Alkaline Phosphatase: 52 IU/L (ref 39–117)
BUN/Creatinine Ratio: 15 (ref 10–24)
BUN: 26 mg/dL (ref 8–27)
Bilirubin Total: 0.4 mg/dL (ref 0.0–1.2)
CALCIUM: 8.6 mg/dL (ref 8.6–10.2)
CO2: 22 mmol/L (ref 18–29)
Chloride: 94 mmol/L — ABNORMAL LOW (ref 96–106)
Creatinine, Ser: 1.71 mg/dL — ABNORMAL HIGH (ref 0.76–1.27)
GFR, EST AFRICAN AMERICAN: 46 mL/min/{1.73_m2} — AB (ref >59)
GFR, EST NON AFRICAN AMERICAN: 39 mL/min/{1.73_m2} — AB (ref >59)
Globulin, Total: 3.8 g/dL (ref 1.5–4.5)
Glucose: 93 mg/dL (ref 65–99)
Potassium: 4 mmol/L (ref 3.5–5.2)
Sodium: 134 mmol/L (ref 134–144)
TOTAL PROTEIN: 7.6 g/dL (ref 6.0–8.5)

## 2016-08-26 LAB — LDL CHOLESTEROL, DIRECT: LDL DIRECT: 60 mg/dL (ref 0–99)

## 2016-08-26 LAB — VITAMIN D 25 HYDROXY (VIT D DEFICIENCY, FRACTURES): VIT D 25 HYDROXY: 35.4 ng/mL (ref 30.0–100.0)

## 2016-08-26 NOTE — Telephone Encounter (Signed)
Patient sent to MRI 1 day ago to r/o CVA, Likely had TIA.   MRI was clear  Will ask triage nurse to call and if stroke symps will send for eval in ED.   Murtis SinkSam Bradshaw, MD [facility] 08/26/2016, 8:39 AM

## 2016-08-26 NOTE — Telephone Encounter (Signed)
Wife advised to take patient to ER. She voices understanding

## 2016-08-26 NOTE — Telephone Encounter (Signed)
Talked to wife. Results of MRI given. Wife reports that husband is alert and oriented x 3. She does report that he can not walk this am that he states that his legs will not move. She is pushing fluids and he has taken one dose of the levaquin.Advised wife if symptoms persist or worsen to please have him evaluated in the ER. Stroke signs and symptoms reviewed with wife and she voices an understanding. Will get with our referral department to get him in with the neurologist.

## 2016-08-26 NOTE — Telephone Encounter (Signed)
Aware of results. Prefers to go to [place] for Neurology appointment.

## 2016-08-26 NOTE — Telephone Encounter (Signed)
Advised to go to the ED.   Murtis SinkSam Bradshaw, MD [facility] 08/26/2016, 9:04 AM

## 2016-09-01 ENCOUNTER — Ambulatory Visit (INDEPENDENT_AMBULATORY_CARE_PROVIDER_SITE_OTHER): Payer: Medicare Other

## 2016-09-01 ENCOUNTER — Ambulatory Visit: Payer: Medicare Other | Admitting: Family Medicine

## 2016-09-01 ENCOUNTER — Encounter: Payer: Self-pay | Admitting: Family Medicine

## 2016-09-01 ENCOUNTER — Ambulatory Visit (INDEPENDENT_AMBULATORY_CARE_PROVIDER_SITE_OTHER): Payer: Medicare Other | Admitting: Family Medicine

## 2016-09-01 VITALS — BP 138/78 | HR 89 | Temp 97.0°F | Ht 73.0 in | Wt 219.8 lb

## 2016-09-01 DIAGNOSIS — G459 Transient cerebral ischemic attack, unspecified: Secondary | ICD-10-CM

## 2016-09-01 DIAGNOSIS — J181 Lobar pneumonia, unspecified organism: Secondary | ICD-10-CM

## 2016-09-01 DIAGNOSIS — J189 Pneumonia, unspecified organism: Secondary | ICD-10-CM

## 2016-09-01 MED ORDER — HYDROCODONE-HOMATROPINE 5-1.5 MG/5ML PO SYRP: 5.0000 mL | ORAL_SOLUTION | Freq: Four times a day (QID) | ORAL | 0 refills | Status: AC | PRN

## 2016-09-01 NOTE — Progress Notes (Signed)
   HPI  Patient presents today to follow-up for cough and leg weakness.  Patient in one week ago after an episode of right lower extremity weakness and then found to be febrile of 102 at home when EMS was called.  He had improvement in the right lower extremity weakness and was seen in our clinic. Based on clinical exam I had treated him for acquired pneumonia.  Patient explains that his cough has improved and he feels somewhat better, however he still has severe nighttime cough.  He denies any shortness of breath.  He did have an additional episode of right lower extremity weakness where he slid off of the couch and laid on the floor all night". His wife called the neighbor to come help him up and called our clinic. He was advised to seek help in the emergency room which he did not do. After an hour or so of sitting down on the couch he had improvement of symptoms and he got up and walked. He states that he has not had another episode in 5 days and feels well currently.  He denies any weakness, numbness, tingling. He is willing to see a neurologist as discussed. He's taking daily aspirin and Lipitor.  He denies orthopnea or chest pain.  PMH: Smoking status noted ROS: Per HPI  Objective: BP 138/78   Pulse 89   Temp 97 F (36.1 C) (Oral)   Ht 6\' 1"  (1.854 m)   Wt 219 lb 12.8 oz (99.7 kg)   BMI 29.00 kg/m  Gen: NAD, alert, cooperative with exam HEENT: NCAT CV: RRR, good S1/S2, no murmur Resp: Nonlabored, good air movement, left lower field with persistent crackles, faint crackles in right lower lung field Ext: No edema, warm Neuro: Alert and oriented, normal gait, EOMI, PERRLA, strength 5/5 and sensation intact in all 4 extremities, 2+ patellar tendon reflexes   CXR- Haziness in L lower lobe, no effusion  Assessment and plan:  #Community Acquired pneumonia Chest x-ray with some haziness the left base, no effusion Patient has just finished Levaquin Given hycodan and for  cough  # TIA Patient with transient right lower extremity weakness on 2 different occasions, TIA versus weakness due to acute illness I have recommended that the patient maintain a very low threshold for seeking emergent medical care should he have another episode. I also recommended seeing neurology. Continue statin and aspirin. Basic neurologic exam is reassuring today     Orders Placed This Encounter  Procedures  . DG Chest 2 View    Standing Status:   Future    Number of Occurrences:   1    Standing Expiration Date:   10/30/2017    Order Specific Question:   Reason for Exam (SYMPTOM  OR DIAGNOSIS REQUIRED)    Answer:   cough with lying down, crackles inL base, eval for Effusion vs infiltrate    Order Specific Question:   Preferred imaging location?    Answer:   Internal    Meds ordered this encounter  Medications  . HYDROcodone-homatropine (HYCODAN) 5-1.5 MG/5ML syrup    Sig: Take 5 mLs by mouth every 6 (six) hours as needed for cough.    Dispense:  120 mL    Refill:  0    Murtis SinkSam Sonakshi Rolland, MD [facility] 09/01/2016, 10:17 AM

## 2016-09-01 NOTE — Patient Instructions (Addendum)
Great to see you!  We will call with x ray results  Please see neurology as expected, they will call to make an appointment

## 2016-09-02 ENCOUNTER — Emergency Department (HOSPITAL_COMMUNITY): Payer: Medicare Other | Admitting: Anesthesiology

## 2016-09-02 ENCOUNTER — Inpatient Hospital Stay (HOSPITAL_COMMUNITY): Payer: Medicare Other

## 2016-09-02 ENCOUNTER — Encounter (HOSPITAL_COMMUNITY): Admission: EM | Disposition: E | Payer: Self-pay | Source: Home / Self Care | Attending: Surgery

## 2016-09-02 ENCOUNTER — Encounter (HOSPITAL_COMMUNITY): Payer: Self-pay | Admitting: Emergency Medicine

## 2016-09-02 ENCOUNTER — Ambulatory Visit: Payer: Medicare Other | Admitting: Family Medicine

## 2016-09-02 ENCOUNTER — Emergency Department (HOSPITAL_COMMUNITY): Payer: Medicare Other

## 2016-09-02 ENCOUNTER — Inpatient Hospital Stay (HOSPITAL_COMMUNITY)
Admission: EM | Admit: 2016-09-02 | Discharge: 2016-10-19 | DRG: 003 | Disposition: E | Payer: Medicare Other | Attending: Surgery | Admitting: Surgery

## 2016-09-02 DIAGNOSIS — J189 Pneumonia, unspecified organism: Secondary | ICD-10-CM | POA: Diagnosis not present

## 2016-09-02 DIAGNOSIS — J811 Chronic pulmonary edema: Secondary | ICD-10-CM | POA: Diagnosis not present

## 2016-09-02 DIAGNOSIS — K661 Hemoperitoneum: Secondary | ICD-10-CM | POA: Diagnosis not present

## 2016-09-02 DIAGNOSIS — Z9842 Cataract extraction status, left eye: Secondary | ICD-10-CM

## 2016-09-02 DIAGNOSIS — N183 Chronic kidney disease, stage 3 (moderate): Secondary | ICD-10-CM | POA: Diagnosis not present

## 2016-09-02 DIAGNOSIS — I959 Hypotension, unspecified: Secondary | ICD-10-CM | POA: Diagnosis present

## 2016-09-02 DIAGNOSIS — R0600 Dyspnea, unspecified: Secondary | ICD-10-CM | POA: Diagnosis not present

## 2016-09-02 DIAGNOSIS — A414 Sepsis due to anaerobes: Secondary | ICD-10-CM | POA: Diagnosis not present

## 2016-09-02 DIAGNOSIS — G459 Transient cerebral ischemic attack, unspecified: Secondary | ICD-10-CM | POA: Diagnosis not present

## 2016-09-02 DIAGNOSIS — J80 Acute respiratory distress syndrome: Secondary | ICD-10-CM | POA: Diagnosis not present

## 2016-09-02 DIAGNOSIS — Z4682 Encounter for fitting and adjustment of non-vascular catheter: Secondary | ICD-10-CM | POA: Diagnosis not present

## 2016-09-02 DIAGNOSIS — Z978 Presence of other specified devices: Secondary | ICD-10-CM

## 2016-09-02 DIAGNOSIS — D62 Acute posthemorrhagic anemia: Secondary | ICD-10-CM | POA: Diagnosis present

## 2016-09-02 DIAGNOSIS — Z95828 Presence of other vascular implants and grafts: Secondary | ICD-10-CM | POA: Diagnosis not present

## 2016-09-02 DIAGNOSIS — R6521 Severe sepsis with septic shock: Secondary | ICD-10-CM | POA: Diagnosis not present

## 2016-09-02 DIAGNOSIS — I713 Abdominal aortic aneurysm, ruptured, unspecified: Secondary | ICD-10-CM

## 2016-09-02 DIAGNOSIS — I82611 Acute embolism and thrombosis of superficial veins of right upper extremity: Secondary | ICD-10-CM | POA: Diagnosis not present

## 2016-09-02 DIAGNOSIS — M7989 Other specified soft tissue disorders: Secondary | ICD-10-CM | POA: Diagnosis not present

## 2016-09-02 DIAGNOSIS — N179 Acute kidney failure, unspecified: Secondary | ICD-10-CM

## 2016-09-02 DIAGNOSIS — I6521 Occlusion and stenosis of right carotid artery: Secondary | ICD-10-CM | POA: Diagnosis present

## 2016-09-02 DIAGNOSIS — E876 Hypokalemia: Secondary | ICD-10-CM | POA: Diagnosis not present

## 2016-09-02 DIAGNOSIS — J9600 Acute respiratory failure, unspecified whether with hypoxia or hypercapnia: Secondary | ICD-10-CM

## 2016-09-02 DIAGNOSIS — Z7982 Long term (current) use of aspirin: Secondary | ICD-10-CM

## 2016-09-02 DIAGNOSIS — I6340 Cerebral infarction due to embolism of unspecified cerebral artery: Secondary | ICD-10-CM | POA: Diagnosis not present

## 2016-09-02 DIAGNOSIS — J9690 Respiratory failure, unspecified, unspecified whether with hypoxia or hypercapnia: Secondary | ICD-10-CM | POA: Diagnosis not present

## 2016-09-02 DIAGNOSIS — I711 Thoracic aortic aneurysm, ruptured: Secondary | ICD-10-CM | POA: Diagnosis not present

## 2016-09-02 DIAGNOSIS — E559 Vitamin D deficiency, unspecified: Secondary | ICD-10-CM | POA: Diagnosis present

## 2016-09-02 DIAGNOSIS — Z430 Encounter for attention to tracheostomy: Secondary | ICD-10-CM

## 2016-09-02 DIAGNOSIS — I2510 Atherosclerotic heart disease of native coronary artery without angina pectoris: Secondary | ICD-10-CM | POA: Diagnosis not present

## 2016-09-02 DIAGNOSIS — E162 Hypoglycemia, unspecified: Secondary | ICD-10-CM | POA: Diagnosis not present

## 2016-09-02 DIAGNOSIS — Z930 Tracheostomy status: Secondary | ICD-10-CM | POA: Diagnosis not present

## 2016-09-02 DIAGNOSIS — R1311 Dysphagia, oral phase: Secondary | ICD-10-CM | POA: Diagnosis not present

## 2016-09-02 DIAGNOSIS — R509 Fever, unspecified: Secondary | ICD-10-CM | POA: Diagnosis not present

## 2016-09-02 DIAGNOSIS — E669 Obesity, unspecified: Secondary | ICD-10-CM | POA: Diagnosis present

## 2016-09-02 DIAGNOSIS — G8191 Hemiplegia, unspecified affecting right dominant side: Secondary | ICD-10-CM | POA: Diagnosis not present

## 2016-09-02 DIAGNOSIS — I471 Supraventricular tachycardia: Secondary | ICD-10-CM | POA: Diagnosis not present

## 2016-09-02 DIAGNOSIS — R600 Localized edema: Secondary | ICD-10-CM | POA: Diagnosis not present

## 2016-09-02 DIAGNOSIS — Z4659 Encounter for fitting and adjustment of other gastrointestinal appliance and device: Secondary | ICD-10-CM

## 2016-09-02 DIAGNOSIS — I714 Abdominal aortic aneurysm, without rupture: Secondary | ICD-10-CM | POA: Diagnosis not present

## 2016-09-02 DIAGNOSIS — E870 Hyperosmolality and hypernatremia: Secondary | ICD-10-CM | POA: Diagnosis not present

## 2016-09-02 DIAGNOSIS — E785 Hyperlipidemia, unspecified: Secondary | ICD-10-CM | POA: Diagnosis present

## 2016-09-02 DIAGNOSIS — Y95 Nosocomial condition: Secondary | ICD-10-CM | POA: Diagnosis not present

## 2016-09-02 DIAGNOSIS — I481 Persistent atrial fibrillation: Secondary | ICD-10-CM | POA: Diagnosis not present

## 2016-09-02 DIAGNOSIS — K3189 Other diseases of stomach and duodenum: Secondary | ICD-10-CM | POA: Diagnosis not present

## 2016-09-02 DIAGNOSIS — E8770 Fluid overload, unspecified: Secondary | ICD-10-CM | POA: Diagnosis not present

## 2016-09-02 DIAGNOSIS — J90 Pleural effusion, not elsewhere classified: Secondary | ICD-10-CM | POA: Diagnosis not present

## 2016-09-02 DIAGNOSIS — A0472 Enterocolitis due to Clostridium difficile, not specified as recurrent: Secondary | ICD-10-CM | POA: Diagnosis not present

## 2016-09-02 DIAGNOSIS — R1313 Dysphagia, pharyngeal phase: Secondary | ICD-10-CM | POA: Diagnosis not present

## 2016-09-02 DIAGNOSIS — I480 Paroxysmal atrial fibrillation: Secondary | ICD-10-CM

## 2016-09-02 DIAGNOSIS — G9340 Encephalopathy, unspecified: Secondary | ICD-10-CM | POA: Diagnosis not present

## 2016-09-02 DIAGNOSIS — R918 Other nonspecific abnormal finding of lung field: Secondary | ICD-10-CM | POA: Diagnosis not present

## 2016-09-02 DIAGNOSIS — K8020 Calculus of gallbladder without cholecystitis without obstruction: Secondary | ICD-10-CM | POA: Diagnosis not present

## 2016-09-02 DIAGNOSIS — I509 Heart failure, unspecified: Secondary | ICD-10-CM | POA: Diagnosis not present

## 2016-09-02 DIAGNOSIS — R1310 Dysphagia, unspecified: Secondary | ICD-10-CM | POA: Diagnosis not present

## 2016-09-02 DIAGNOSIS — D649 Anemia, unspecified: Secondary | ICD-10-CM | POA: Diagnosis not present

## 2016-09-02 DIAGNOSIS — R0602 Shortness of breath: Secondary | ICD-10-CM | POA: Diagnosis not present

## 2016-09-02 DIAGNOSIS — R778 Other specified abnormalities of plasma proteins: Secondary | ICD-10-CM

## 2016-09-02 DIAGNOSIS — I739 Peripheral vascular disease, unspecified: Secondary | ICD-10-CM | POA: Diagnosis not present

## 2016-09-02 DIAGNOSIS — Z9289 Personal history of other medical treatment: Secondary | ICD-10-CM

## 2016-09-02 DIAGNOSIS — F17210 Nicotine dependence, cigarettes, uncomplicated: Secondary | ICD-10-CM | POA: Diagnosis present

## 2016-09-02 DIAGNOSIS — Z9911 Dependence on respirator [ventilator] status: Secondary | ICD-10-CM | POA: Diagnosis not present

## 2016-09-02 DIAGNOSIS — I248 Other forms of acute ischemic heart disease: Secondary | ICD-10-CM | POA: Diagnosis not present

## 2016-09-02 DIAGNOSIS — R069 Unspecified abnormalities of breathing: Secondary | ICD-10-CM

## 2016-09-02 DIAGNOSIS — Z515 Encounter for palliative care: Secondary | ICD-10-CM | POA: Diagnosis not present

## 2016-09-02 DIAGNOSIS — J9620 Acute and chronic respiratory failure, unspecified whether with hypoxia or hypercapnia: Secondary | ICD-10-CM

## 2016-09-02 DIAGNOSIS — Z8249 Family history of ischemic heart disease and other diseases of the circulatory system: Secondary | ICD-10-CM

## 2016-09-02 DIAGNOSIS — M2578 Osteophyte, vertebrae: Secondary | ICD-10-CM | POA: Diagnosis present

## 2016-09-02 DIAGNOSIS — F329 Major depressive disorder, single episode, unspecified: Secondary | ICD-10-CM | POA: Diagnosis not present

## 2016-09-02 DIAGNOSIS — I10 Essential (primary) hypertension: Secondary | ICD-10-CM | POA: Diagnosis not present

## 2016-09-02 DIAGNOSIS — Z961 Presence of intraocular lens: Secondary | ICD-10-CM | POA: Diagnosis present

## 2016-09-02 DIAGNOSIS — Q8789 Other specified congenital malformation syndromes, not elsewhere classified: Secondary | ICD-10-CM | POA: Diagnosis not present

## 2016-09-02 DIAGNOSIS — I129 Hypertensive chronic kidney disease with stage 1 through stage 4 chronic kidney disease, or unspecified chronic kidney disease: Secondary | ICD-10-CM | POA: Diagnosis present

## 2016-09-02 DIAGNOSIS — R109 Unspecified abdominal pain: Secondary | ICD-10-CM | POA: Diagnosis not present

## 2016-09-02 DIAGNOSIS — J439 Emphysema, unspecified: Secondary | ICD-10-CM | POA: Diagnosis present

## 2016-09-02 DIAGNOSIS — J9584 Transfusion-related acute lung injury (TRALI): Secondary | ICD-10-CM | POA: Diagnosis not present

## 2016-09-02 DIAGNOSIS — J9601 Acute respiratory failure with hypoxia: Secondary | ICD-10-CM

## 2016-09-02 DIAGNOSIS — I638 Other cerebral infarction: Secondary | ICD-10-CM | POA: Diagnosis not present

## 2016-09-02 DIAGNOSIS — K228 Other specified diseases of esophagus: Secondary | ICD-10-CM | POA: Diagnosis not present

## 2016-09-02 DIAGNOSIS — M50321 Other cervical disc degeneration at C4-C5 level: Secondary | ICD-10-CM | POA: Diagnosis not present

## 2016-09-02 DIAGNOSIS — Z8673 Personal history of transient ischemic attack (TIA), and cerebral infarction without residual deficits: Secondary | ICD-10-CM

## 2016-09-02 DIAGNOSIS — R748 Abnormal levels of other serum enzymes: Secondary | ICD-10-CM | POA: Diagnosis not present

## 2016-09-02 DIAGNOSIS — R29898 Other symptoms and signs involving the musculoskeletal system: Secondary | ICD-10-CM

## 2016-09-02 DIAGNOSIS — Z9841 Cataract extraction status, right eye: Secondary | ICD-10-CM

## 2016-09-02 DIAGNOSIS — E878 Other disorders of electrolyte and fluid balance, not elsewhere classified: Secondary | ICD-10-CM | POA: Diagnosis not present

## 2016-09-02 DIAGNOSIS — T502X5A Adverse effect of carbonic-anhydrase inhibitors, benzothiadiazides and other diuretics, initial encounter: Secondary | ICD-10-CM | POA: Diagnosis not present

## 2016-09-02 DIAGNOSIS — I6330 Cerebral infarction due to thrombosis of unspecified cerebral artery: Secondary | ICD-10-CM | POA: Insufficient documentation

## 2016-09-02 DIAGNOSIS — R7989 Other specified abnormal findings of blood chemistry: Secondary | ICD-10-CM

## 2016-09-02 DIAGNOSIS — M79A3 Nontraumatic compartment syndrome of abdomen: Secondary | ICD-10-CM | POA: Diagnosis not present

## 2016-09-02 DIAGNOSIS — A419 Sepsis, unspecified organism: Secondary | ICD-10-CM | POA: Diagnosis not present

## 2016-09-02 DIAGNOSIS — R739 Hyperglycemia, unspecified: Secondary | ICD-10-CM | POA: Diagnosis not present

## 2016-09-02 DIAGNOSIS — Z66 Do not resuscitate: Secondary | ICD-10-CM | POA: Diagnosis not present

## 2016-09-02 DIAGNOSIS — I97410 Intraoperative hemorrhage and hematoma of a circulatory system organ or structure complicating a cardiac catheterization: Secondary | ICD-10-CM | POA: Diagnosis not present

## 2016-09-02 DIAGNOSIS — Z9889 Other specified postprocedural states: Secondary | ICD-10-CM

## 2016-09-02 DIAGNOSIS — J9621 Acute and chronic respiratory failure with hypoxia: Secondary | ICD-10-CM | POA: Diagnosis not present

## 2016-09-02 HISTORY — DX: Enterocolitis due to Clostridium difficile, not specified as recurrent: A04.72

## 2016-09-02 HISTORY — PX: HEMATOMA EVACUATION: SHX5118

## 2016-09-02 HISTORY — PX: APPLICATION OF WOUND VAC: SHX5189

## 2016-09-02 HISTORY — PX: ABDOMINAL EXPOSURE: SHX5708

## 2016-09-02 HISTORY — PX: ABDOMINAL AORTIC ENDOVASCULAR STENT GRAFT: SHX5707

## 2016-09-02 LAB — DIFFERENTIAL
BAND NEUTROPHILS: 0 %
Basophils Absolute: 0 10*3/uL (ref 0.0–0.1)
Basophils Relative: 0 %
Blasts: 0 %
EOS ABS: 0 10*3/uL (ref 0.0–0.7)
EOS PCT: 0 %
LYMPHS PCT: 12 %
Lymphs Abs: 3.1 10*3/uL (ref 0.7–4.0)
METAMYELOCYTES PCT: 0 %
Monocytes Absolute: 2.7 10*3/uL — ABNORMAL HIGH (ref 0.1–1.0)
Monocytes Relative: 10 %
Myelocytes: 0 %
NEUTROS ABS: 20.9 10*3/uL — AB (ref 1.7–7.7)
NEUTROS PCT: 78 %
OTHER: 0 %
Promyelocytes Absolute: 0 %
nRBC: 0 /100 WBC

## 2016-09-02 LAB — POCT I-STAT 3, ART BLOOD GAS (G3+)
ACID-BASE DEFICIT: 2 mmol/L (ref 0.0–2.0)
Acid-Base Excess: 2 mmol/L (ref 0.0–2.0)
Acid-base deficit: 6 mmol/L — ABNORMAL HIGH (ref 0.0–2.0)
BICARBONATE: 22.2 mmol/L (ref 20.0–28.0)
Bicarbonate: 20.6 mmol/L (ref 20.0–28.0)
Bicarbonate: 26.7 mmol/L (ref 20.0–28.0)
O2 SAT: 90 %
O2 SAT: 96 %
O2 Saturation: 88 %
PCO2 ART: 38.1 mmHg (ref 32.0–48.0)
PH ART: 7.445 (ref 7.350–7.450)
PO2 ART: 47 mmHg — AB (ref 83.0–108.0)
Patient temperature: 34.2
Patient temperature: 34.3
Patient temperature: 35
TCO2: 22 mmol/L (ref 0–100)
TCO2: 23 mmol/L (ref 0–100)
TCO2: 28 mmol/L (ref 0–100)
pCO2 arterial: 38.2 mmHg (ref 32.0–48.0)
pCO2 arterial: 30 mmHg — ABNORMAL LOW (ref 32.0–48.0)
pH, Arterial: 7.325 — ABNORMAL LOW (ref 7.350–7.450)
pH, Arterial: 7.466 — ABNORMAL HIGH (ref 7.350–7.450)
pO2, Arterial: 50 mmHg — ABNORMAL LOW (ref 83.0–108.0)
pO2, Arterial: 71 mmHg — ABNORMAL LOW (ref 83.0–108.0)

## 2016-09-02 LAB — I-STAT CHEM 8, ED
BUN: 22 mg/dL — ABNORMAL HIGH (ref 6–20)
CALCIUM ION: 1.07 mmol/L — AB (ref 1.15–1.40)
Chloride: 102 mmol/L (ref 101–111)
Creatinine, Ser: 2.4 mg/dL — ABNORMAL HIGH (ref 0.61–1.24)
GLUCOSE: 286 mg/dL — AB (ref 65–99)
HCT: 37 % — ABNORMAL LOW (ref 39.0–52.0)
HEMOGLOBIN: 12.6 g/dL — AB (ref 13.0–17.0)
Potassium: 3.9 mmol/L (ref 3.5–5.1)
Sodium: 138 mmol/L (ref 135–145)
TCO2: 20 mmol/L (ref 0–100)

## 2016-09-02 LAB — MAGNESIUM: Magnesium: 1.5 mg/dL — ABNORMAL LOW (ref 1.7–2.4)

## 2016-09-02 LAB — HEPATIC FUNCTION PANEL
ALK PHOS: 37 U/L — AB (ref 38–126)
ALT: 22 U/L (ref 17–63)
AST: 28 U/L (ref 15–41)
Albumin: 3 g/dL — ABNORMAL LOW (ref 3.5–5.0)
BILIRUBIN DIRECT: 0.3 mg/dL (ref 0.1–0.5)
BILIRUBIN INDIRECT: 0.5 mg/dL (ref 0.3–0.9)
TOTAL PROTEIN: 7.3 g/dL (ref 6.5–8.1)
Total Bilirubin: 0.8 mg/dL (ref 0.3–1.2)

## 2016-09-02 LAB — GLUCOSE, CAPILLARY
GLUCOSE-CAPILLARY: 207 mg/dL — AB (ref 65–99)
GLUCOSE-CAPILLARY: 229 mg/dL — AB (ref 65–99)

## 2016-09-02 LAB — CK TOTAL AND CKMB (NOT AT ARMC)
CK, MB: 6.5 ng/mL — ABNORMAL HIGH (ref 0.5–5.0)
Relative Index: 3.5 — ABNORMAL HIGH (ref 0.0–2.5)
Total CK: 185 U/L (ref 49–397)

## 2016-09-02 LAB — BASIC METABOLIC PANEL
ANION GAP: 17 — AB (ref 5–15)
Anion gap: 16 — ABNORMAL HIGH (ref 5–15)
BUN: 23 mg/dL — ABNORMAL HIGH (ref 6–20)
BUN: 16 mg/dL (ref 6–20)
CALCIUM: 8.4 mg/dL — AB (ref 8.9–10.3)
CHLORIDE: 101 mmol/L (ref 101–111)
CO2: 18 mmol/L — ABNORMAL LOW (ref 22–32)
CO2: 22 mmol/L (ref 22–32)
CREATININE: 2.45 mg/dL — AB (ref 0.61–1.24)
CREATININE: 2.04 mg/dL — AB (ref 0.61–1.24)
Calcium: 7.2 mg/dL — ABNORMAL LOW (ref 8.9–10.3)
Chloride: 109 mmol/L (ref 101–111)
GFR calc non Af Amer: 25 mL/min — ABNORMAL LOW (ref >60)
GFR, EST AFRICAN AMERICAN: 29 mL/min — AB (ref >60)
GFR, EST AFRICAN AMERICAN: 36 mL/min — AB (ref >60)
GFR, EST NON AFRICAN AMERICAN: 31 mL/min — AB (ref >60)
Glucose, Bld: 287 mg/dL — ABNORMAL HIGH (ref 65–99)
Glucose, Bld: 194 mg/dL — ABNORMAL HIGH (ref 65–99)
POTASSIUM: 3.8 mmol/L (ref 3.5–5.1)
Potassium: 3.8 mmol/L (ref 3.5–5.1)
SODIUM: 136 mmol/L (ref 135–145)
SODIUM: 147 mmol/L — AB (ref 135–145)

## 2016-09-02 LAB — DIC (DISSEMINATED INTRAVASCULAR COAGULATION)PANEL
D-Dimer, Quant: 3.11 ug/mL-FEU — ABNORMAL HIGH (ref 0.00–0.50)
Platelets: 20 10*3/uL — CL (ref 150–400)
Prothrombin Time: 21.2 seconds — ABNORMAL HIGH (ref 11.4–15.2)
Smear Review: NONE SEEN

## 2016-09-02 LAB — CBC
HCT: 38.1 % — ABNORMAL LOW (ref 39.0–52.0)
HCT: 36.7 % — ABNORMAL LOW (ref 39.0–52.0)
Hemoglobin: 12.5 g/dL — ABNORMAL LOW (ref 13.0–17.0)
Hemoglobin: 12.5 g/dL — ABNORMAL LOW (ref 13.0–17.0)
MCH: 32.3 pg (ref 26.0–34.0)
MCH: 30.3 pg (ref 26.0–34.0)
MCHC: 32.8 g/dL (ref 30.0–36.0)
MCHC: 34.1 g/dL (ref 30.0–36.0)
MCV: 98.4 fL (ref 78.0–100.0)
MCV: 89.1 fL (ref 78.0–100.0)
PLATELETS: 354 10*3/uL (ref 150–400)
Platelets: 125 10*3/uL — ABNORMAL LOW (ref 150–400)
RBC: 3.87 MIL/uL — AB (ref 4.22–5.81)
RBC: 4.12 MIL/uL — ABNORMAL LOW (ref 4.22–5.81)
RDW: 13.3 % (ref 11.5–15.5)
RDW: 15.4 % (ref 11.5–15.5)
WBC: 26.9 10*3/uL — AB (ref 4.0–10.5)
WBC: 12.4 10*3/uL — ABNORMAL HIGH (ref 4.0–10.5)

## 2016-09-02 LAB — PROTIME-INR
INR: 1.14
INR: 1.45
PROTHROMBIN TIME: 17.8 s — AB (ref 11.4–15.2)
Prothrombin Time: 14.6 seconds (ref 11.4–15.2)

## 2016-09-02 LAB — ABO/RH: ABO/RH(D): A NEG

## 2016-09-02 LAB — I-STAT TROPONIN, ED: TROPONIN I, POC: 0.01 ng/mL (ref 0.00–0.08)

## 2016-09-02 LAB — APTT
aPTT: 27 seconds (ref 24–36)
aPTT: 43 seconds — ABNORMAL HIGH (ref 24–36)

## 2016-09-02 LAB — I-STAT CG4 LACTIC ACID, ED: LACTIC ACID, VENOUS: 9.75 mmol/L — AB (ref 0.5–1.9)

## 2016-09-02 LAB — PREPARE RBC (CROSSMATCH)

## 2016-09-02 LAB — DIC (DISSEMINATED INTRAVASCULAR COAGULATION) PANEL
APTT: 58 s — AB (ref 24–36)
FIBRINOGEN: 142 mg/dL — AB (ref 210–475)
INR: 1.81

## 2016-09-02 SURGERY — INSERTION, ENDOVASCULAR STENT GRAFT, AORTA, ABDOMINAL
Anesthesia: General | Site: Groin

## 2016-09-02 MED ORDER — ENOXAPARIN SODIUM 40 MG/0.4ML ~~LOC~~ SOLN: 40.0000 mg | SUBCUTANEOUS | Status: DC

## 2016-09-02 MED ORDER — SODIUM CHLORIDE 0.9 % IV SOLN: 500.0000 mL | Freq: Once | INTRAVENOUS | Status: DC | PRN

## 2016-09-02 MED ORDER — CALCIUM CHLORIDE 10 % IV SOLN
INTRAVENOUS | Status: DC | PRN
  Administered 2016-09-02: 0.5 g via INTRAVENOUS
  Administered 2016-09-02: .2 g via INTRAVENOUS
  Administered 2016-09-02: 0.5 g via INTRAVENOUS

## 2016-09-02 MED ORDER — NOREPINEPHRINE BITARTRATE 1 MG/ML IV SOLN
0.0000 ug/min | INTRAVENOUS | Status: AC
  Administered 2016-09-02: 5 ug/min via INTRAVENOUS
  Filled 2016-09-02: qty 4

## 2016-09-02 MED ORDER — PANTOPRAZOLE SODIUM 40 MG PO TBEC: 40.0000 mg | DELAYED_RELEASE_TABLET | Freq: Every day | ORAL | Status: DC

## 2016-09-02 MED ORDER — FENTANYL CITRATE (PF) 100 MCG/2ML IJ SOLN
50.0000 ug | INTRAMUSCULAR | Status: DC | PRN
  Administered 2016-09-02: 50 ug via INTRAVENOUS
  Filled 2016-09-02: qty 2

## 2016-09-02 MED ORDER — EPINEPHRINE PF 1 MG/ML IJ SOLN
INTRAMUSCULAR | Status: DC | PRN
  Administered 2016-09-02 (×2): 1 mg via INTRAVENOUS

## 2016-09-02 MED ORDER — CEFAZOLIN SODIUM 1 G IJ SOLR
INTRAMUSCULAR | Status: DC | PRN
  Administered 2016-09-02: 2 g via INTRAMUSCULAR

## 2016-09-02 MED ORDER — DOCUSATE SODIUM 100 MG PO CAPS
100.0000 mg | ORAL_CAPSULE | Freq: Every day | ORAL | Status: DC
  Administered 2016-09-06: 100 mg via ORAL
  Filled 2016-09-02: qty 1

## 2016-09-02 MED ORDER — ALBUMIN HUMAN 5 % IV SOLN
INTRAVENOUS | Status: DC | PRN
  Administered 2016-09-02 (×2): via INTRAVENOUS

## 2016-09-02 MED ORDER — MIDAZOLAM HCL 2 MG/2ML IJ SOLN
1.0000 mg | INTRAMUSCULAR | Status: DC | PRN
  Administered 2016-09-03: 1 mg via INTRAVENOUS
  Filled 2016-09-02: qty 2

## 2016-09-02 MED ORDER — LIDOCAINE HCL (CARDIAC) 20 MG/ML IV SOLN
INTRAVENOUS | Status: DC | PRN
  Administered 2016-09-02: 50 mg via INTRAVENOUS

## 2016-09-02 MED ORDER — ALUM & MAG HYDROXIDE-SIMETH 200-200-20 MG/5ML PO SUSP: 15.0000 mL | ORAL | Status: DC | PRN

## 2016-09-02 MED ORDER — KCL IN DEXTROSE-NACL 20-5-0.45 MEQ/L-%-% IV SOLN
INTRAVENOUS | Status: DC
  Administered 2016-09-02: 21:00:00 via INTRAVENOUS
  Administered 2016-09-03: 75 mL via INTRAVENOUS
  Administered 2016-09-03 (×2): via INTRAVENOUS
  Filled 2016-09-02 (×4): qty 1000

## 2016-09-02 MED ORDER — FENTANYL CITRATE (PF) 100 MCG/2ML IJ SOLN: 50.0000 ug | Freq: Once | INTRAMUSCULAR | Status: DC

## 2016-09-02 MED ORDER — BISACODYL 10 MG RE SUPP
10.0000 mg | Freq: Every day | RECTAL | Status: DC | PRN
  Administered 2016-09-06: 10 mg via RECTAL
  Filled 2016-09-02: qty 1

## 2016-09-02 MED ORDER — HYDROMORPHONE HCL 1 MG/ML IJ SOLN
0.5000 mg | INTRAMUSCULAR | Status: DC | PRN
  Administered 2016-09-02 – 2016-09-03 (×2): 1 mg via INTRAVENOUS
  Filled 2016-09-02 (×2): qty 1

## 2016-09-02 MED ORDER — LABETALOL HCL 5 MG/ML IV SOLN
10.0000 mg | INTRAVENOUS | Status: DC | PRN
  Administered 2016-09-02: 10 mg via INTRAVENOUS
  Filled 2016-09-02 (×4): qty 4

## 2016-09-02 MED ORDER — SODIUM BICARBONATE 8.4 % IV SOLN
INTRAVENOUS | Status: DC | PRN
  Administered 2016-09-02 (×9): 50 meq via INTRAVENOUS

## 2016-09-02 MED ORDER — PROPOFOL 10 MG/ML IV BOLUS
INTRAVENOUS | Status: DC | PRN
  Administered 2016-09-02 (×2): 50 mg via INTRAVENOUS

## 2016-09-02 MED ORDER — LACTATED RINGERS IV SOLN
INTRAVENOUS | Status: DC | PRN
  Administered 2016-09-02 (×2): via INTRAVENOUS

## 2016-09-02 MED ORDER — FENTANYL CITRATE (PF) 100 MCG/2ML IJ SOLN
INTRAMUSCULAR | Status: AC
  Filled 2016-09-02: qty 2

## 2016-09-02 MED ORDER — METOPROLOL TARTRATE 5 MG/5ML IV SOLN
2.0000 mg | INTRAVENOUS | Status: AC | PRN
  Administered 2016-09-07 – 2016-09-09 (×2): 5 mg via INTRAVENOUS
  Filled 2016-09-02 (×3): qty 5

## 2016-09-02 MED ORDER — INSULIN ASPART 100 UNIT/ML ~~LOC~~ SOLN
1.0000 [IU] | SUBCUTANEOUS | Status: DC
  Administered 2016-09-03: 3 [IU] via SUBCUTANEOUS
  Administered 2016-09-03: 1 [IU] via SUBCUTANEOUS
  Administered 2016-09-03: 2 [IU] via SUBCUTANEOUS
  Administered 2016-09-04 – 2016-09-08 (×6): 1 [IU] via SUBCUTANEOUS

## 2016-09-02 MED ORDER — IOPAMIDOL (ISOVUE-370) INJECTION 76%
100.0000 mL | Freq: Once | INTRAVENOUS | Status: AC | PRN
  Administered 2016-09-02: 80 mL via INTRAVENOUS

## 2016-09-02 MED ORDER — EPINEPHRINE PF 1 MG/ML IJ SOLN
0.5000 ug/min | INTRAVENOUS | Status: AC
  Administered 2016-09-02: 5 ug/min via INTRAVENOUS
  Filled 2016-09-02: qty 4

## 2016-09-02 MED ORDER — FENTANYL CITRATE (PF) 100 MCG/2ML IJ SOLN
50.0000 ug | Freq: Once | INTRAMUSCULAR | Status: AC
  Administered 2016-09-02: 50 ug via INTRAVENOUS

## 2016-09-02 MED ORDER — SODIUM CHLORIDE 0.9 % IV SOLN
INTRAVENOUS | Status: DC | PRN
  Administered 2016-09-02: 3000 mL

## 2016-09-02 MED ORDER — GUAIFENESIN-DM 100-10 MG/5ML PO SYRP: 15.0000 mL | ORAL_SOLUTION | ORAL | Status: DC | PRN

## 2016-09-02 MED ORDER — MIDAZOLAM HCL 2 MG/2ML IJ SOLN: 1.0000 mg | INTRAMUSCULAR | Status: DC | PRN

## 2016-09-02 MED ORDER — PHENOL 1.4 % MT LIQD: 1.0000 | OROMUCOSAL | Status: DC | PRN

## 2016-09-02 MED ORDER — 0.9 % SODIUM CHLORIDE (POUR BTL) OPTIME
TOPICAL | Status: DC | PRN
  Administered 2016-09-02: 1000 mL

## 2016-09-02 MED ORDER — FENTANYL CITRATE (PF) 100 MCG/2ML IJ SOLN: 50.0000 ug | INTRAMUSCULAR | Status: DC | PRN

## 2016-09-02 MED ORDER — ETOMIDATE 2 MG/ML IV SOLN
INTRAVENOUS | Status: DC | PRN
  Administered 2016-09-02: 12 mg via INTRAVENOUS

## 2016-09-02 MED ORDER — PHENYLEPHRINE HCL 10 MG/ML IJ SOLN
INTRAVENOUS | Status: DC | PRN
  Administered 2016-09-02: 50 ug/min via INTRAVENOUS

## 2016-09-02 MED ORDER — MIDAZOLAM HCL 2 MG/2ML IJ SOLN
1.0000 mg | INTRAMUSCULAR | Status: DC | PRN
  Administered 2016-09-03 (×2): 1 mg via INTRAVENOUS
  Filled 2016-09-02 (×3): qty 2

## 2016-09-02 MED ORDER — MAGNESIUM SULFATE 2 GM/50ML IV SOLN
2.0000 g | Freq: Every day | INTRAVENOUS | Status: AC | PRN
  Administered 2016-09-03: 2 g via INTRAVENOUS
  Filled 2016-09-02: qty 50

## 2016-09-02 MED ORDER — DEXMEDETOMIDINE HCL IN NACL 200 MCG/50ML IV SOLN
0.4000 ug/kg/h | INTRAVENOUS | Status: DC
  Administered 2016-09-02: 0.7 ug/kg/h via INTRAVENOUS
  Filled 2016-09-02 (×2): qty 50

## 2016-09-02 MED ORDER — POTASSIUM CHLORIDE CRYS ER 20 MEQ PO TBCR: 20.0000 meq | EXTENDED_RELEASE_TABLET | Freq: Every day | ORAL | Status: DC | PRN

## 2016-09-02 MED ORDER — DEXTROSE 5 % IV SOLN
1.5000 g | Freq: Two times a day (BID) | INTRAVENOUS | Status: AC
  Administered 2016-09-02 – 2016-09-03 (×2): 1.5 g via INTRAVENOUS
  Filled 2016-09-02 (×2): qty 1.5

## 2016-09-02 MED ORDER — SODIUM CHLORIDE 0.9 % IV SOLN
25.0000 ug/h | INTRAVENOUS | Status: DC
  Administered 2016-09-03: 400 ug/h via INTRAVENOUS
  Administered 2016-09-03: 100 ug/h via INTRAVENOUS
  Administered 2016-09-03 – 2016-09-04 (×2): 400 ug/h via INTRAVENOUS
  Administered 2016-09-04: 300 ug/h via INTRAVENOUS
  Administered 2016-09-04 – 2016-09-05 (×2): 350 ug/h via INTRAVENOUS
  Administered 2016-09-05: 300 ug/h via INTRAVENOUS
  Administered 2016-09-05: 350 ug/h via INTRAVENOUS
  Administered 2016-09-06: 150 ug/h via INTRAVENOUS
  Administered 2016-09-07: 100 ug/h via INTRAVENOUS
  Administered 2016-09-08: 75 ug/h via INTRAVENOUS
  Filled 2016-09-02 (×13): qty 50

## 2016-09-02 MED ORDER — FENTANYL BOLUS VIA INFUSION
25.0000 ug | INTRAVENOUS | Status: DC | PRN
  Filled 2016-09-02: qty 25

## 2016-09-02 MED ORDER — CHLORHEXIDINE GLUCONATE 0.12% ORAL RINSE (MEDLINE KIT)
15.0000 mL | Freq: Two times a day (BID) | OROMUCOSAL | Status: DC
  Administered 2016-09-03 – 2016-09-11 (×17): 15 mL via OROMUCOSAL

## 2016-09-02 MED ORDER — IODIXANOL 320 MG/ML IV SOLN
INTRAVENOUS | Status: DC | PRN
  Administered 2016-09-02: 91.8 mL via INTRA_ARTERIAL

## 2016-09-02 MED ORDER — ACETAMINOPHEN 325 MG PO TABS
325.0000 mg | ORAL_TABLET | ORAL | Status: DC | PRN
  Filled 2016-09-02: qty 2

## 2016-09-02 MED ORDER — DEXMEDETOMIDINE HCL IN NACL 400 MCG/100ML IV SOLN
INTRAVENOUS | Status: DC | PRN
  Administered 2016-09-02: 0.7 ug/kg/h via INTRAVENOUS

## 2016-09-02 MED ORDER — ONDANSETRON HCL 4 MG/2ML IJ SOLN: 4.0000 mg | Freq: Four times a day (QID) | INTRAMUSCULAR | Status: DC | PRN

## 2016-09-02 MED ORDER — FENTANYL CITRATE (PF) 100 MCG/2ML IJ SOLN
INTRAMUSCULAR | Status: DC | PRN
  Administered 2016-09-02: 50 ug via INTRAVENOUS
  Administered 2016-09-02 (×2): 100 ug via INTRAVENOUS

## 2016-09-02 MED ORDER — ROCURONIUM BROMIDE 100 MG/10ML IV SOLN
INTRAVENOUS | Status: DC | PRN
  Administered 2016-09-02 (×3): 50 mg via INTRAVENOUS

## 2016-09-02 MED ORDER — ORAL CARE MOUTH RINSE
15.0000 mL | Freq: Four times a day (QID) | OROMUCOSAL | Status: DC
  Administered 2016-09-02 – 2016-09-12 (×38): 15 mL via OROMUCOSAL

## 2016-09-02 MED ORDER — MIDAZOLAM HCL 5 MG/5ML IJ SOLN
INTRAMUSCULAR | Status: DC | PRN
  Administered 2016-09-02 (×3): 2 mg via INTRAVENOUS

## 2016-09-02 MED ORDER — HYDRALAZINE HCL 20 MG/ML IJ SOLN
5.0000 mg | INTRAMUSCULAR | Status: DC | PRN
  Administered 2016-09-13: 5 mg via INTRAVENOUS
  Filled 2016-09-02: qty 1

## 2016-09-02 MED ORDER — LACTATED RINGERS IV SOLN
INTRAVENOUS | Status: DC | PRN
  Administered 2016-09-02 (×2): via INTRAVENOUS

## 2016-09-02 MED ORDER — ACETAMINOPHEN 325 MG RE SUPP
325.0000 mg | RECTAL | Status: DC | PRN
  Administered 2016-09-04: 325 mg via RECTAL
  Administered 2016-09-07: 650 mg via RECTAL
  Filled 2016-09-02 (×2): qty 2

## 2016-09-02 SURGICAL SUPPLY — 100 items
BAG SNAP BAND KOVER 36X36 (MISCELLANEOUS) ×4 IMPLANT
BLADE SURG 10 STRL SS (BLADE) ×8 IMPLANT
BLADE SURG 11 STRL SS (BLADE) ×4 IMPLANT
BLADE SURG 15 STRL LF DISP TIS (BLADE) ×3 IMPLANT
BLADE SURG 15 STRL SS (BLADE) ×1
CANISTER SUCTION 2500CC (MISCELLANEOUS) ×4 IMPLANT
CANNULA VESSEL 3MM 2 BLNT TIP (CANNULA) ×8 IMPLANT
CATH ANGIO 5F BER2 65CM (CATHETERS) ×4 IMPLANT
CATH OMNI FLUSH .035X70CM (CATHETERS) ×4 IMPLANT
CLIP TI MEDIUM 24 (CLIP) ×4 IMPLANT
CLIP TI WIDE RED SMALL 24 (CLIP) ×4 IMPLANT
COVER MAYO STAND STRL (DRAPES) ×4 IMPLANT
COVER SURGICAL LIGHT HANDLE (MISCELLANEOUS) ×4 IMPLANT
DERMABOND ADVANCED (GAUZE/BANDAGES/DRESSINGS) ×2
DERMABOND ADVANCED .7 DNX12 (GAUZE/BANDAGES/DRESSINGS) ×6 IMPLANT
DEVICE CLOSURE PERCLS PRGLD 6F (VASCULAR PRODUCTS) ×9 IMPLANT
DEVICE TORQUE H2O (MISCELLANEOUS) ×4 IMPLANT
DRYSEAL FLEXSHEATH 12FR 33CM (SHEATH) ×1
DRYSEAL FLEXSHEATH 18FR 33CM (SHEATH) ×1
ELECT BLADE 4.0 EZ CLEAN MEGAD (MISCELLANEOUS) ×8
ELECT BLADE 6.5 EXT (BLADE) IMPLANT
ELECT REM PT RETURN 9FT ADLT (ELECTROSURGICAL) ×4
ELECTRODE BLDE 4.0 EZ CLN MEGD (MISCELLANEOUS) ×6 IMPLANT
ELECTRODE REM PT RTRN 9FT ADLT (ELECTROSURGICAL) ×3 IMPLANT
EXCLUDER TNK LEG 35MX14X14 (Endovascular Graft) ×3 IMPLANT
EXCLUDER TRUNK LEG 35MX14X14 (Endovascular Graft) ×4 IMPLANT
EXTENDER ENDOPROSTHESIS 12X7 (Endovascular Graft) ×4 IMPLANT
FELT TEFLON 4 X1 (Mesh General) IMPLANT
GEL ULTRASOUND 20GR AQUASONIC (MISCELLANEOUS) IMPLANT
GLOVE BIO SURGEON STRL SZ 6.5 (GLOVE) ×4 IMPLANT
GLOVE BIO SURGEON STRL SZ7.5 (GLOVE) ×4 IMPLANT
GLOVE BIOGEL PI IND STRL 6.5 (GLOVE) ×3 IMPLANT
GLOVE BIOGEL PI IND STRL 7.5 (GLOVE) ×6 IMPLANT
GLOVE BIOGEL PI INDICATOR 6.5 (GLOVE) ×1
GLOVE BIOGEL PI INDICATOR 7.5 (GLOVE) ×2
GLOVE ECLIPSE 7.0 STRL STRAW (GLOVE) ×4 IMPLANT
GLOVE SURG SS PI 7.5 STRL IVOR (GLOVE) ×4 IMPLANT
GOWN STRL REUS W/ TWL LRG LVL3 (GOWN DISPOSABLE) ×6 IMPLANT
GOWN STRL REUS W/ TWL XL LVL3 (GOWN DISPOSABLE) ×3 IMPLANT
GOWN STRL REUS W/TWL LRG LVL3 (GOWN DISPOSABLE) ×2
GOWN STRL REUS W/TWL XL LVL3 (GOWN DISPOSABLE) ×1
GRAFT BALLN CATH 65CM (STENTS) ×3 IMPLANT
GUIDEWIRE ANGLED .035X150CM (WIRE) ×4 IMPLANT
HEMOSTAT SNOW SURGICEL 2X4 (HEMOSTASIS) IMPLANT
INSERT FOGARTY 61MM (MISCELLANEOUS) ×12 IMPLANT
INSERT FOGARTY SM (MISCELLANEOUS) ×12 IMPLANT
KIT BASIN OR (CUSTOM PROCEDURE TRAY) ×4 IMPLANT
KIT ROOM TURNOVER OR (KITS) ×4 IMPLANT
LEG CONTRALATERAL 16X16X9.5 (Endovascular Graft) ×4 IMPLANT
LEG CONTRALETERAL16X16X11.5 (Endovascular Graft) ×1 IMPLANT
LOOP VESSEL MAXI BLUE (MISCELLANEOUS) ×8 IMPLANT
LOOP VESSEL MINI RED (MISCELLANEOUS) ×12 IMPLANT
NEEDLE PERC 18GX7CM (NEEDLE) ×4 IMPLANT
NS IRRIG 1000ML POUR BTL (IV SOLUTION) ×8 IMPLANT
PACK AORTA (CUSTOM PROCEDURE TRAY) IMPLANT
PACK ENDOVASCULAR (PACKS) ×4 IMPLANT
PAD ARMBOARD 7.5X6 YLW CONV (MISCELLANEOUS) ×8 IMPLANT
PERCLOSE PROGLIDE 6F (VASCULAR PRODUCTS) ×12
SET MICROPUNCTURE 5F STIFF (MISCELLANEOUS) ×4 IMPLANT
SHEATH BRITE TIP 8FR 23CM (MISCELLANEOUS) ×8 IMPLANT
SHEATH DRYSEAL FLEX 12FR 33CM (SHEATH) ×3 IMPLANT
SHEATH DRYSEAL FLEX 18FR 33CM (SHEATH) ×3 IMPLANT
SPONGE ABDOMINAL VAC ABTHERA (MISCELLANEOUS) ×4 IMPLANT
SPONGE INTESTINAL PEANUT (DISPOSABLE) ×8 IMPLANT
SPONGE LAP 18X18 X RAY DECT (DISPOSABLE) ×12 IMPLANT
STENT GRAFT BALLN CATH 65CM (STENTS) ×1
STENT GRAFT CONTRALAT 16X11.5 (Endovascular Graft) ×3 IMPLANT
STOPCOCK MORSE 400PSI 3WAY (MISCELLANEOUS) ×8 IMPLANT
SUT ETHIBOND 5 LR DA (SUTURE) IMPLANT
SUT ETHILON 4 0 PS 2 18 (SUTURE) ×4 IMPLANT
SUT PDS AB 1 TP1 54 (SUTURE) ×8 IMPLANT
SUT PROLENE 3 0 SH 48 (SUTURE) ×4 IMPLANT
SUT PROLENE 5 0 C 1 24 (SUTURE) ×4 IMPLANT
SUT PROLENE 5 0 C 1 36 (SUTURE) IMPLANT
SUT SILK 2 0 (SUTURE) ×1
SUT SILK 2 0 TIES 10X30 (SUTURE) ×4 IMPLANT
SUT SILK 2 0SH CR/8 30 (SUTURE) ×4 IMPLANT
SUT SILK 2-0 18XBRD TIE 12 (SUTURE) ×3 IMPLANT
SUT SILK 3 0 (SUTURE) ×1
SUT SILK 3 0 TIES 10X30 (SUTURE) ×4 IMPLANT
SUT SILK 3-0 18XBRD TIE 12 (SUTURE) ×3 IMPLANT
SUT SILK 4 0 (SUTURE) ×1
SUT SILK 4-0 18XBRD TIE 12 (SUTURE) ×3 IMPLANT
SUT VIC AB 2-0 CT1 27 (SUTURE) ×2
SUT VIC AB 2-0 CT1 TAPERPNT 27 (SUTURE) ×6 IMPLANT
SUT VIC AB 3-0 SH 27 (SUTURE)
SUT VIC AB 3-0 SH 27X BRD (SUTURE) IMPLANT
SUT VICRYL 4-0 PS2 18IN ABS (SUTURE) ×8 IMPLANT
SYR 30ML LL (SYRINGE) ×4 IMPLANT
SYR BULB IRRIGATION 50ML (SYRINGE) ×4 IMPLANT
TAPE UMBILICAL COTTON 1/8X30 (MISCELLANEOUS) ×8 IMPLANT
TOWEL BLUE STERILE X RAY DET (MISCELLANEOUS) ×8 IMPLANT
TRAY FOLEY W/METER SILVER 16FR (SET/KITS/TRAYS/PACK) ×4 IMPLANT
TUBE CONNECTING 12X1/4 (SUCTIONS) ×4 IMPLANT
TUBING HIGH PRESSURE 120CM (CONNECTOR) ×4 IMPLANT
WATER STERILE IRR 1000ML POUR (IV SOLUTION) ×8 IMPLANT
WIRE AMPLATZ SS-J .035X180CM (WIRE) ×8 IMPLANT
WIRE BENTSON .035X145CM (WIRE) ×8 IMPLANT
WND VAC CANISTER 500ML (MISCELLANEOUS) ×4 IMPLANT
YANKAUER SUCT BULB TIP NO VENT (SUCTIONS) ×4 IMPLANT

## 2016-09-02 NOTE — H&P (Addendum)
History and Physical  Patient name: Michael Heath MRN: [id number] DOB: [birth year] Sex: male   Reason for Admission:  Chief Complaint  Patient presents with  . Flank Pain    HISTORY OF PRESENT ILLNESS: I received a call from Dr. Verdie MosherLiu via [facility] regarding the patient who presented with a one-day history of right-sided flank pain that had been getting gradually worse for the past 24 hours.  A CTA scan was ordered which I reviewed and felt was consistent with a ruptured AAA.  I recommended that the patient be transported emergently to the [facility] area by [facility].  He was taken straight to the OR and resuscitated and intubated.   Past medical history: Unable to obtain due to patient's condition  Family history: Unable to obtain due to patient's condition  Past Surgical History:  Procedure Laterality Date  . CATARACT EXTRACTION W/PHACO Right 03/2010  . CATARACT EXTRACTION W/PHACO Left 02/24/2013   Procedure: CATARACT EXTRACTION PHACO AND INTRAOCULAR LENS PLACEMENT (IOC);  Surgeon: Gemma PayorKerry Hunt, MD;  Location: [facility];  Service: Ophthalmology;  Laterality: Left;  CDE: 13.82  . EYE SURGERY      Social History   Social History  . Marital status: Married    Spouse name: N/A  . Number of children: N/A  . Years of education: N/A   Occupational History  . Not on file.   Social History Main Topics  . Smoking status: Current Every Day Smoker    Packs/day: 1.50    Years: 55.00    Types: Cigarettes  . Smokeless tobacco: Current User  . Alcohol use No  . Drug use: No  . Sexual activity: Not on file   Other Topics Concern  . Not on file   Social History Narrative  . No narrative on file    No family history on file.  Allergies as of 02-17-2017  . (No Known Allergies)    No current facility-administered medications on file prior to encounter.    Current Outpatient Prescriptions on File Prior to Encounter  Medication Sig Dispense Refill  . aspirin EC 81 MG tablet  Take 1 tablet (81 mg total) by mouth daily.    Marland Kitchen. atorvastatin (LIPITOR) 20 MG tablet Take 1 tablet (20 mg total) by mouth daily. 90 tablet 3  . HYDROcodone-homatropine (HYCODAN) 5-1.5 MG/5ML syrup Take 5 mLs by mouth every 6 (six) hours as needed for cough. 120 mL 0  . Vitamin D, Ergocalciferol, (DRISDOL) 50000 units CAPS capsule Take 1 capsule (50,000 Units total) by mouth every 7 (seven) days. 12 capsule 0     REVIEW OF SYSTEMS: Unable to obtain due to patient's condition  PHYSICAL EXAMINATION: General: The patient appears their stated age.  Vital signs are BP (!) 86/68   Pulse (!) 132   Temp 97.6 F (36.4 C) (Oral)   Resp 21   Ht 6\' 1"  (1.854 m)   Wt 222 lb (100.7 kg)   SpO2 100%   BMI 29.29 kg/m  HEENT:  No gross abnormalities Pulmonary: intubated Abdomen: distended and mottled. 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   Assessment:  Ruptured AAA Plan: Attempted repair of ruptured AAA under emergency consent     V. Wells Leidy Massar IV,  M.D. [facility] Office: [phone] Pager:  [phone]

## 2016-09-02 NOTE — Progress Notes (Signed)
[facility] Physician-Brief Progress Note Patient Name: Michael Heath DOB: 1946/02/04 MRN: [id number]   Date of Service  09/07/2016  HPI/Events of Note  FSBS is high, HR low and CI low  eICU Interventions  Will start SSI and change precedex to fentanyl infusion     Intervention Category Evaluation Type: Other  Erin FullingKurian Raynette Arras 09/06/2016, 11:20 PM

## 2016-09-02 NOTE — ED Notes (Signed)
ED Provider at bedside. 

## 2016-09-02 NOTE — ED Notes (Signed)
Consent signed by wife for emergency blood.  Giving 2 units O neg blood.  Dr. Joni FearsLui at bedside talking with family

## 2016-09-02 NOTE — Progress Notes (Signed)
MD Mannam notified of ABG initially after arriving to 2S. Orders were placed and RT is monitoring.   MD Brabham notified of unable to place NG or OR after multiple attempts, output from wound vac canisters, along with patients CO/CI decreasing from time of arrival. Orders were placed and I will notify of any other changes.  Will continue to monitor patient.  Horton ChinMacKayla A Akram Kissick, RN

## 2016-09-02 NOTE — ED Triage Notes (Signed)
EMS brings in, Right sided flank pain onset 2 hours ago. IV and 500 NS bolus in route.

## 2016-09-02 NOTE — Transfer of Care (Signed)
Immediate Anesthesia Transfer of Care Note  Patient: Michael Heath  Procedure(s) Performed: Procedure(s): ABDOMINAL AORTIC ENDOVASCULAR STENT GRAFT (N/A) Abdominal Exploration APPLICATION OF WOUND VAC EVACUATION HEMATOMA  Patient Location: SICU  Anesthesia Type:General  Level of Consciousness: Patient remains intubated per anesthesia plan  Airway & Oxygen Therapy: Patient placed on Ventilator (see vital sign flow sheet for setting)  Post-op Assessment: Report given to RN  Post vital signs: Reviewed and stable  Last Vitals:  Vitals:   09/07/2016 1530 09/06/2016 1539  BP: 116/94 (!) 86/68  Pulse:    Resp: 25 21  Temp:      Last Pain:  Vitals:   08/25/2016 1527  TempSrc: Oral  PainSc:          Complications: No apparent anesthesia complications

## 2016-09-02 NOTE — Anesthesia Procedure Notes (Signed)
Arterial Line Insertion Start/End02/08/2016 7:20 PM, 09/02/2016 7:27 PM Performed by: Val EagleMOSER, Jad Johansson, anesthesiologist  Patient location: OR. Preanesthetic checklist: patient identified, IV checked, site marked, risks and benefits discussed, surgical consent, monitors and equipment checked, pre-op evaluation, timeout performed and anesthesia consent Right, radial was placed Hand hygiene performed  and maximum sterile barriers used   Attempts: 2 Procedure performed using ultrasound guided technique. Ultrasound Notes:anatomy identified, needle tip was noted to be adjacent to the nerve/plexus identified and no ultrasound evidence of intravascular and/or intraneural injection Post procedure assessment: normal  Patient tolerated the procedure well with no immediate complications. Additional procedure comments: 5Fr micropuncture sheath used .

## 2016-09-02 NOTE — ED Provider Notes (Signed)
[facility] Provider Note   CSN: [id number] Arrival date & time: 08-Sep-2016  1223     History   Chief Complaint Chief Complaint  Patient presents with  . Flank Pain    HPI Michael Heath is a 71 y.o. male.  HPI  71 year old male with right flank pain. History of HLD, tobacco use, CKD, and TIA. Onset of right sided flank pain, gradually worsening since yesterday. H/o of nephrolithiasis, which he felt was similar. No abdominal pain, chest pain, dyspnea, syncope, numbness/weakness, dysuria, urinary frequency, hematuria. No medications prior to arrival. No modifying factors.  History reviewed. No pertinent past medical history.  Patient Active Problem List   Diagnosis Date Noted  . TIA (transient ischemic attack) 09/01/2016  . CKD (chronic kidney disease), stage III 01/28/2016  . HLD (hyperlipidemia) 12/27/2015  . Vitamin D deficiency 12/27/2015  . Elevated blood pressure reading without diagnosis of hypertension 12/25/2015  . Obesity 12/25/2015  . Healthcare maintenance 12/25/2015    Past Surgical History:  Procedure Laterality Date  . CATARACT EXTRACTION W/PHACO Right 03/2010  . CATARACT EXTRACTION W/PHACO Left 02/24/2013   Procedure: CATARACT EXTRACTION PHACO AND INTRAOCULAR LENS PLACEMENT (IOC);  Surgeon: Gemma PayorKerry Hunt, MD;  Location: [facility];  Service: Ophthalmology;  Laterality: Left;  CDE: 13.82  . EYE SURGERY         Home Medications    Prior to Admission medications   Medication Sig Start Date End Date Taking? Authorizing Provider  aspirin EC 81 MG tablet Take 1 tablet (81 mg total) by mouth daily. 01/28/16   Elenora GammaSamuel L Bradshaw, MD  atorvastatin (LIPITOR) 20 MG tablet Take 1 tablet (20 mg total) by mouth daily. 01/28/16   Elenora GammaSamuel L Bradshaw, MD  HYDROcodone-homatropine [facility](HYCODAN) 5-1.5 MG/5ML syrup Take 5 mLs by mouth every 6 (six) hours as needed for cough. 09/01/16   Elenora GammaSamuel L Bradshaw, MD  Vitamin D, Ergocalciferol, (DRISDOL) 50000 units CAPS capsule Take 1 capsule  (50,000 Units total) by mouth every 7 (seven) days. 08/25/16   Elenora GammaSamuel L Bradshaw, MD    Family History No family history on file.  Social History Social History  Substance Use Topics  . Smoking status: Current Every Day Smoker    Packs/day: 1.50    Years: 55.00    Types: Cigarettes  . Smokeless tobacco: Current User  . Alcohol use No     Allergies   Patient has no known allergies.   Review of Systems Review of Systems  Unable to perform ROS: Acuity of condition     Physical Exam Updated Vital Signs BP (!) 86/68   Pulse (!) 132   Temp 97.6 F (36.4 C) (Oral)   Resp 21   Ht 6\' 1"  (1.854 m)   Wt 222 lb (100.7 kg)   SpO2 100%   BMI 29.29 kg/m   Physical Exam Physical Exam  Nursing note and vitals reviewed. Constitutional: ill appearing, in no acute distress Head: Normocephalic and atraumatic.  Mouth/Throat: Oropharynx is clear and moist.  Neck: Normal range of motion. Neck supple.  Cardiovascular: Tachycardic rate and regular rhythm.   Pulmonary/Chest: Effort normal and breath sounds normal. +1 bilateral DP pulses and bilateral radial pulses Abdominal: Soft. There is no tenderness. There is no rebound and no guarding. Mild distension Musculoskeletal: No deformities Neurological: Alert, no facial droop, fluent speech, moves all extremities symmetrically Skin: Skin is cool. Mottled bilateral lower extremities and abdominal wall Psychiatric: Cooperative   ED Treatments / Results  Labs (all labs ordered  are listed, but only abnormal results are displayed) Labs Reviewed  BASIC METABOLIC PANEL - Abnormal; Notable for the following:       Result Value   CO2 18 (*)    Glucose, Bld 287 (*)    BUN 23 (*)    Creatinine, Ser 2.45 (*)    Calcium 8.4 (*)    GFR calc non Af Amer 25 (*)    GFR calc Af Amer 29 (*)    Anion gap 17 (*)    All other components within normal limits  I-STAT CG4 LACTIC ACID, ED - Abnormal; Notable for the following:    Lactic Acid, Venous  9.75 (*)    All other components within normal limits  I-STAT CHEM 8, ED - Abnormal; Notable for the following:    BUN 22 (*)    Creatinine, Ser 2.40 (*)    Glucose, Bld 286 (*)    Calcium, Ion 1.07 (*)    Hemoglobin 12.6 (*)    HCT 37.0 (*)    All other components within normal limits  URINALYSIS, ROUTINE W REFLEX MICROSCOPIC  CBC  HEPATIC FUNCTION PANEL  APTT  PROTIME-INR  DIFFERENTIAL  I-STAT TROPOININ, ED  TYPE AND SCREEN    EKG  EKG Interpretation  Date/Time:  Tuesday September 02 2016 12:42:44 EST Ventricular Rate:  127 PR Interval:  128 QRS Duration: 82 QT Interval:  316 QTC Calculation: 459 R Axis:   -14 Text Interpretation:  Sinus tachycardia with Fusion complexes Septal infarct , age undetermined Abnormal ECG other than tachycardia, no significant change  Confirmed by Dura Mccormack MD, Syla Devoss [phone]) on 08/26/2016 3:24:19 PM       Radiology Dg Chest 2 View  Result Date: 09/01/2016 CLINICAL DATA:  Community-acquired pneumonia left lower lobe. EXAM: CHEST  2 VIEW COMPARISON:  None. FINDINGS: COPD with mild hyperinflation of the lungs. Heart size and vascularity normal. Negative for infiltrate effusion or mass. No pleural effusion. Mild apical scarring bilaterally. IMPRESSION: No active cardiopulmonary disease. Electronically Signed   By: Marlan Palau M.D.   On: 09/01/2016 11:34    Procedures Procedures (including critical care time) CRITICAL CARE Performed by: Lavera Guise   Total critical care time: 40 minutes  Critical care time was exclusive of separately billable procedures and treating other patients.  Critical care was necessary to treat or prevent imminent or life-threatening deterioration.  Critical care was time spent personally by me on the following activities: development of treatment plan with patient and/or surrogate as well as nursing, discussions with consultants, evaluation of patient's response to treatment, examination of patient, obtaining history  from patient or surrogate, ordering and performing treatments and interventions, ordering and review of laboratory studies, ordering and review of radiographic studies, pulse oximetry and re-evaluation of patient's condition.  Medications Ordered in ED Medications  iopamidol (ISOVUE-370) 76 % injection 100 mL (80 mLs Intravenous Contrast Given 09/14/2016 1516)  fentaNYL (SUBLIMAZE) injection 50 mcg (50 mcg Intravenous Given 09/15/2016 1534)     Initial Impression / Assessment and Plan / ED Course  I have reviewed the triage vital signs and the nursing notes.  Pertinent labs & imaging results that were available during my care of the patient were reviewed by me and considered in my medical decision making (see chart for details).    Hypotensive, tachycardic in ED. Mottling in bilateral LE and abdominal wall. Bedside ultrasound concerning for AAA. Hypotension improved with fluids. Rushed emergently to CT. CT chest/abd/pelvis visualized and concern for ruptured abdominal aortic  aneurysm. Discussed with Dr. Myra Gianotti, accepted to [facility] OR for operative repair.  Final Clinical Impressions(s) / ED Diagnoses   Final diagnoses:  Ruptured abdominal aortic aneurysm (AAA) [facility])    New Prescriptions New Prescriptions   No medications on file     Lavera Guise, MD 09/01/2016 1541

## 2016-09-02 NOTE — Op Note (Signed)
Patient name: TORETTO TINGLER MRN: [id number] DOB: 1946-03-19 Sex: male  08/21/2016 Pre-operative Diagnosis: Aortic aneurysm Post-operative diagnosis:  Same Surgeon:  Durene Cal Assistants:  Lianne Cure Procedure:   #1: Endovascular repair of ruptured abdominal aortic aneurysm   #2: Ultrasound-guided access, bilateral common femoral artery   #3: Abdominal aortogram   #4: Catheter aorta 2   #5: Bilateral iliac extension   #6: Exploratory laparotomy   #7: Evacuation of abdominal retroperitoneal hematoma   #8: Placement of abdominal wound VAC Anesthesia:  Gen. Blood Loss:  See anesthesia record Specimens:  None  Findings:  Initial imaging revealed an aortocaval fistula.  This was successfully excluded as was the ruptured aneurysm.  The patient developed an abdominal compartment syndrome which required exploratory laparotomy for decompression.  His abdomen could not be closed and therefore a abdominal VAC was placed  Devices used: Main body was primary left, Gore 34 x 14 x 14.  Ipsilateral extension was a Gore 12 x 7.  Contralateral right was a Gore 16 x 9.5 followed by a 16 x 11.5   Indications:  The patient presented to [facility] with a one-day history of right flank pain.  He became hypotensive.  He was diagnosed with a ruptured abdominal aortic aneurysm and was [facility].   Procedure:  The patient was identified in the holding area and taken to [facility] OR ROOM 16  The patient was then placed supine on the table. general anesthesia was administered.  The patient was prepped and draped in the usual sterile fashion.  A time out was called and antibiotics were administered.  Ultrasound was used to evaluate bilateral common femoral arteries.  There widely patent with no significant stenosis.  I initially cannulated the right femoral artery under ultrasound guidance with an 18-gauge needle.  An 035 wire was advanced without resistance.  The subcutaneous  tissue was dilated with an 8 [place] dilator.  Provide devices were placed at the 11:00 and 1:00 position for pre-closure.  A 8 French sheath was placed.  Next, using a Benson wire and a Barrett's seen to catheter, I gained access into the disc seen the thoracic aorta.  An Amplatz Super Stiff wire was then placed.  A French sheath was removed and a 12 French sheath was placed.  A Q-50 balloon was placed inflated and the descending thoracic aorta for proximal control.  Next I cannulated the left femoral artery with ultrasound guidance an 18-gauge needle.  Once a 035 wire was inserted, pro-glide devices were deployed at the 11:00 and 1:00 position for pre-closure.  8 French sheath was placed.  A Omni flush catheter was then inserted into this catheter I performed abdominal angiography which identified the level of the renal arteries.  This also showed an aortocaval fistula.  I then prepared the main body device on the back table.  This was a Gore 34 x 14 x 14 device.  A wire exchange was performed on the left side an Amplatz superstiff wire was placed followed by a 18 French sheath.  The main body was then inserted.  I deflated the balloon and performed another angiogram to confirm that the device was then properly location.  The device was then deployed down to the contralateral gate.  I then deployed the remaining ipsilateral limb and the delivery system was removed.  I then removed the Q-50 balloon from the right side and advanced it up the left side and inflated it in the  proximal portion of the graft for continued proximal control.  Next, I cannulated the contralateral gate with a Berenstein to catheter and a Glidewire.  I confirmed cannulation with a Omni flush catheter.  I then extended the contralateral side with a short device.  This was a 16 x 9.5.  A retrograde injection was performed and a LAO position identifying the right hypogastric artery.  I then extended distally using a 16 x 11.5 device landing at  the right hypogastric artery.  Next a contrast injection was performed in the RAO position locating the left hypogastric artery.  A extension on the ipsilateral side was then inserted this is a 12 x 7 device.  I then performed molding of the proximal and distal attachment sites as well as device overlap using a Q-50 balloon.  A completion arteriogram was performed showing successful exclusion of the aneurysm and continued patency of bilateral renal and hypogastric and external iliac arteries.  Bentson wires were inserted bilaterally.  I closed the left groin by securing the pro-glide devices.  The 12 French sheath was left in the right groin so that anesthesia could have access to an arterial line.  The patient had developed abdominal compartment syndrome and therefore attention was turned towards the abdomen.  I made a midline incision from just below the xiphoid to below the umbilicus.  The fascia was identified in the midline and opened with cautery.  I entered the peritoneal cavity sharply and then opened the abdomen throughout the length of the incision.  Cell Saver was used.  We evacuated a significant hematoma.  All obvious clot was removed.  The bowel was inspected.  It appeared to be healthy.  It was somewhat dilated.  It was very obvious that I was not going to be a closed the abdomen.  I evacuated as much of the hematoma as possible.  I ensured that there was no further ongoing bleeding other than secondary to coagulopathy.  I obtained a temporary abdominal wound VAC closure and placed this and got a good seal.  Next, I removed the sheath from the right groin and secured the pro-glide devices.  I held pressure for another 10 minutes until this area was hemostatic.  Dermabond was placed in both groin wounds.  The patient did have Doppler signals in both feet.  His legs are less mottled.  He was taken out of the operating room to the intensive care unit in critical condition.   Disposition:  To PACU in  stable condition.   Juleen ChinaV. Wells Tykeshia Tourangeau, M.D. [facility] Office: [phone] Pager:  [phone]

## 2016-09-02 NOTE — ED Notes (Signed)
2nd unit of O negative blood hung at this time with Gunnar FusiPaula, RN with [facility] verifying and signature. Blood number [id number].

## 2016-09-02 NOTE — Progress Notes (Signed)
RT note:  Called by RN due to Desat to 88%. Preformed recruitment maneuver x 3 minutes with recovery of sat's.  Patient placed back on previous settings with PEEP increase to 8.  RN to notify MD.

## 2016-09-02 NOTE — ED Notes (Signed)
[facility] to [facility] ([facility]) at this time.

## 2016-09-02 NOTE — ED Notes (Signed)
CRITICAL VALUE ALERT  Critical value received:  Lactic Acid 9.75  Date of notification:  09/10/2016  Time of notification:  1538  Critical value read back: YES  Nurse who received alert: Sharia ReeveLeigh Mia Winthrop, RN  MD notified (1st page):  Dr. Reynolds BowlLu made aware at [phone]

## 2016-09-02 NOTE — ED Notes (Signed)
Pt c/o severe back pain.  Fentanyl being given.  Family at bedside.

## 2016-09-02 NOTE — Anesthesia Preprocedure Evaluation (Signed)
Anesthesia Evaluation  Patient identified by MRN, date of birth, ID bandGeneral Assessment Comment:Acute dissecting abd aneurysm  Reviewed: Unable to perform ROS - Chart review only  Airway        Dental   Pulmonary Current Smoker,           Cardiovascular hypertension, + Peripheral Vascular Disease   Rhythm:Regular Rate:Tachycardia     Neuro/Psych TIA   GI/Hepatic   Endo/Other  Morbid obesity  Renal/GU Renal InsufficiencyRenal disease     Musculoskeletal   Abdominal   Peds  Hematology  (+) anemia ,   Anesthesia Other Findings   Reproductive/Obstetrics                             Anesthesia Physical Anesthesia Plan  ASA: IV and emergent  Anesthesia Plan: General   Post-op Pain Management:    Induction: Intravenous  Airway Management Planned: Oral ETT  Additional Equipment: PA Cath, Ultrasound Guidance Line Placement and Arterial line  Intra-op Plan:   Post-operative Plan: Post-operative intubation/ventilation  Informed Consent: I have reviewed the patients History and Physical, chart, labs and discussed the procedure including the risks, benefits and alternatives for the proposed anesthesia with the patient or authorized representative who has indicated his/her understanding and acceptance.     Plan Discussed with:   Anesthesia Plan Comments:         Anesthesia Quick Evaluation

## 2016-09-02 NOTE — ED Notes (Signed)
Verbal order from Dr. Verdie MosherLiu to increase rate of blood transfusion to 54200ml/hr

## 2016-09-02 NOTE — ED Triage Notes (Signed)
Pt is SOB after transferring form stretcher, State was recently treated for Pneumonia

## 2016-09-02 NOTE — Progress Notes (Signed)
S/p endovascular repair of ruptured AAA and ex-lap for abdominal compartmetn syndrome Intubated and sedated in ICU Abdominal vac in place Doppler pedal pulses Off pressors  Adequate UOP  Family updated at bedside Remains critical Continue full resussitation efforts CCM consulted   Michael Heath

## 2016-09-02 NOTE — ED Notes (Signed)
HR 130 in triage

## 2016-09-02 NOTE — ED Notes (Signed)
Pt wife stating that pt was "breathing funny." Pt brought into triage room to have vitals rechecked. Pt alert and oriented at this time, breathing status the same as arrival.

## 2016-09-02 NOTE — Progress Notes (Signed)
Called MD Kasa about patients blood sugar elevated and patients HR below 60. Orders received. Will continue to monitor.  Horton ChinMacKayla A Rudra Hobbins, RN

## 2016-09-02 NOTE — Anesthesia Procedure Notes (Signed)
Procedure Name: Intubation Date/Time: 09/16/2016 4:26 PM Performed by: Rejeana Brock L Pre-anesthesia Checklist: Patient identified, Emergency Drugs available, Suction available and Patient being monitored Patient Re-evaluated:Patient Re-evaluated prior to inductionOxygen Delivery Method: Circle System Utilized Preoxygenation: Pre-oxygenation with 100% oxygen Intubation Type: IV induction Ventilation: Mask ventilation without difficulty Laryngoscope Size: Mac and 4 Grade View: Grade I Tube type: Subglottic suction tube Tube size: 8.0 mm Number of attempts: 1 Airway Equipment and Method: Stylet and Oral airway Placement Confirmation: ETT inserted through vocal cords under direct vision,  positive ETCO2 and breath sounds checked- equal and bilateral Secured at: 23 cm Tube secured with: Tape Dental Injury: Teeth and Oropharynx as per pre-operative assessment

## 2016-09-02 NOTE — Consult Note (Addendum)
PULMONARY / CRITICAL CARE MEDICINE   Name: Michael Heath MRN: [id number] DOB: 1946-05-06    ADMISSION DATE:  08/23/2016 CONSULTATION DATE:  09/07/2016  REFERRING MD:  Coral Else MD  CHIEF COMPLAINT:  Ruptured AAA aneurysm status post repair, acute hypoxic respiratory failure.  HISTORY OF PRESENT ILLNESS:   71 year old with past medical history of hypertension, hyperlipidemia, CKD, stage III. Admitted today with right flank, back pain. A CTA which showed ruptured AAA. He was transferred emergently to [facility] for stent repair. In the OR he received 16 units PRBC, 10 units FFP, 3 units platelets, 1 unit cryo and 1 Cell Saver. He developed abdominal comparment syndrome in OR from massive RP bleed. He is brought back to the ICU with an open abdomen. PCCM consulted for vent, sedation management.  PAST MEDICAL HISTORY :  He  has no past medical history on file.  PAST SURGICAL HISTORY: He  has a past surgical history that includes Eye surgery; Cataract extraction w/PHACO (Right, 03/2010); and Cataract extraction w/PHACO (Left, 02/24/2013).  No Known Allergies  No current facility-administered medications on file prior to encounter.    Current Outpatient Prescriptions on File Prior to Encounter  Medication Sig  . aspirin EC 81 MG tablet Take 1 tablet (81 mg total) by mouth daily.  Marland Kitchen atorvastatin (LIPITOR) 20 MG tablet Take 1 tablet (20 mg total) by mouth daily.  Marland Kitchen HYDROcodone-homatropine (HYCODAN) 5-1.5 MG/5ML syrup Take 5 mLs by mouth every 6 (six) hours as needed for cough.  . Vitamin D, Ergocalciferol, (DRISDOL) 50000 units CAPS capsule Take 1 capsule (50,000 Units total) by mouth every 7 (seven) days.    FAMILY HISTORY:  His indicated that his mother is deceased. He indicated that his father is deceased.    SOCIAL HISTORY: He  reports that he has been smoking Cigarettes.  He has a 82.50 pack-year smoking history. He uses smokeless tobacco. He reports that he does not drink alcohol or use  drugs.  REVIEW OF SYSTEMS:   Unable to obtain as patient is intubated  SUBJECTIVE:    VITAL SIGNS: BP (!) 86/68   Pulse 62   Temp (!) 93.7 F (34.3 C)   Resp (!) 22   Ht 6\' 1"  (1.854 m)   Wt 222 lb (100.7 kg)   SpO2 92%   BMI 29.29 kg/m   HEMODYNAMICS: PAP: (32-45)/(20-31) 32/21 CVP:  [22 mmHg] 22 mmHg CO:  [3.4 L/min-7.6 L/min] 7.6 L/min CI:  [3.4 L/min/m2] 3.4 L/min/m2  VENTILATOR SETTINGS: Vent Mode: PRVC FiO2 (%):  [60 %] 60 % Set Rate:  [22 bmp] 22 bmp Vt Set:  [640 mL] 640 mL PEEP:  [5 cmH20-8 cmH20] 8 cmH20 Plateau Pressure:  [25 cmH20] 25 cmH20  INTAKE / OUTPUT: I/O last 3 completed shifts: In: 40981 [I.V.:2000; Blood:9819; IV Piggyback:250] Out: 400 [Urine:400]  PHYSICAL EXAMINATION: General:  Sedated, unresponsive, obese Neuro: No focal deficits HEENT:  No thyromegaly, JVD Cardiovascular:  Regular rate and rhythm, no murmurs rubs gallops Lungs: Clear to auscultation Abdomen:  Open abdomen, wound VAC in place Musculoskeletal:  Normal tone and bulk Skin: Intact  LABS:  BMET  Recent Labs Lab 09/04/2016 1342 09/10/2016 1503  NA 136 138  K 3.8 3.9  CL 101 102  CO2 18*  --   BUN 23* 22*  CREATININE 2.45* 2.40*  GLUCOSE 287* 286*    Electrolytes  Recent Labs Lab 08/22/2016 1342  CALCIUM 8.4*    CBC  Recent Labs Lab 09/07/2016 1342 08/29/2016 1503 08/24/2016 1739  09/06/2016 2007  WBC 26.9*  --   --  12.4*  HGB 12.5* 12.6*  --  12.5*  HCT 38.1* 37.0*  --  36.7*  PLT 354  --  20* 125*    Coag's  Recent Labs Lab 09/01/2016 1459 08/24/2016 1739 09/09/2016 2007  APTT 27 58* 43*  INR 1.14 1.81 1.45    Sepsis Markers  Recent Labs Lab 08/23/2016 1509  LATICACIDVEN 9.75*    ABG  Recent Labs Lab 09/17/2016 2040  PHART 7.325*  PCO2ART 38.2  PO2ART 50.0*    Liver Enzymes  Recent Labs Lab 09/11/2016 1459  AST 28  ALT 22  ALKPHOS 37*  BILITOT 0.8  ALBUMIN 3.0*    Cardiac Enzymes No results for input(s): TROPONINI, PROBNP in  the last 168 hours.  Glucose No results for input(s): GLUCAP in the last 168 hours.  Imaging Ct Angio Chest/abd/pel For Dissection W And/or Wo Contrast  Result Date: 08/22/2016 CLINICAL DATA:  Right flank pain, hypotension EXAM: CT ANGIOGRAPHY CHEST, ABDOMEN AND PELVIS TECHNIQUE: Multidetector CT imaging through the chest, abdomen and pelvis was performed using the standard protocol during bolus administration of intravenous contrast. Multiplanar reconstructed images and MIPs were obtained and reviewed to evaluate the vascular anatomy. CONTRAST:  100 cc Isovue 370 COMPARISON:  09/01/2016 chest x-ray FINDINGS: CTA CHEST FINDINGS Cardiovascular: Preferential opacification of the thoracic aorta. Atherosclerosis of the thoracic aorta without aneurysm or dissection. No mediastinal hemorrhage, hematoma, or intramural hematoma. Atherosclerosis of the major branch vessels remain patent. Three-vessel arch anatomy. Native coronary atherosclerosis noted. Normal heart size. No pericardial effusion. Mediastinum/Nodes: No adenopathy.  Small hiatal hernia evident. Lungs/Pleura: Background moderate emphysema pattern with apical blebs. No focal pneumonia, collapse or consolidation. Mild bibasilar nodular bronchovascular opacities, difficult to exclude mild bronchitis/bronchopneumonia. Trachea and central airways are patent. No pleural abnormality, effusion or pneumothorax. Musculoskeletal: Thoracic spondylosis evident. No acute osseous finding or fracture. Sternum intact. Review of the MIP images confirms the above findings. CTA ABDOMEN AND PELVIS FINDINGS VASCULAR Aorta: Infrarenal atherosclerotic abdominal aortic aneurysm measures 7 cm in AP dimension and 7.8 cm transverse. There is evidence of acute abdominal aortic aneurysm rupture with irregularity and displacement of the wall calcifications on the right lateral margin of the aneurysm with contrast extravasation into the large right retroperitoneal hematoma.  Retroperitoneal hematoma measures approximately 14.2 cm in AP dimension and 8.8 cm transverse, image 141. Celiac: Atherosclerosis and wall irregularity without occlusion. SMA: Minor atherosclerotic change without occlusion or stenosis. Renals: Single widely patent renal arteries with minor atherosclerosis. No accessory renal artery demonstrated. IMA: Not well demonstrated. Very diminutive in caliber. Difficult to assess for patency. Inflow: Atherosclerosis and tortuosity of the iliac vessels. The common, internal and external iliac arteries remain patent. No inflow disease or occlusion. 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 Veins: Venous imaging not performed. Review of the MIP images confirms the above findings. NON-VASCULAR Hepatobiliary: In the posterior right hepatic dome, there is an enhancing hypervascular indeterminate hepatic mass measuring 2.2 x 2.1 cm, image 94. Recommend follow-up nonemergent evaluation with MRI. No biliary obstruction. Cholelithiasis evident. Pancreas: Unremarkable. No pancreatic ductal dilatation or surrounding inflammatory changes. Spleen: Choose Adrenals/Urinary Tract: Nonspecific enhancement of the adrenal glands related to arterial phase imaging. Symmetric renal enhancement. Hypodense renal cysts noted in the right upper pole measuring 2 cm and in the left lower pole measuring 15 mm. No renal obstruction or hydronephrosis. No hydroureter or ureteral calculus appreciated. Urinary bladder collapsed. Stomach/Bowel: Negative for bowel  obstruction, significant dilatation, ileus, or free air. Normal appendix. Scattered colonic diverticulosis, most pronounced in the sigmoid. No acute inflammatory process. No fluid collection or abscess. Lymphatic: No adenopathy. Reproductive: Symmetric seminal vesicles. Normal size prostate. No significant finding. Other: No hernia.  Intact abdominal wall. Musculoskeletal: Lumbar spondylosis and lower lumbar facet  arthropathy. No acute compression fracture. No acute osseous finding. Degenerative changes of the pelvis and hips. Review of the MIP images confirms the above findings. 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 Moderate pulmonary emphysema Small hiatal hernia Incidental renal cysts Colonic diverticulosis Cholelithiasis **An incidental finding of potential clinical significance has been found. Indeterminate enhancing posterior right hepatic dome 2.2 cm mass by arterial phase imaging. No available comparison studies. This warrants follow-up nonemergent MRI evaluation. These results were called by telephone at the time of interpretation on 09/11/2016 at 3:51 pm to Dr. Crista CurbANA LIU , who verbally acknowledged these results.** Electronically Signed   By: Judie PetitM.  Shick M.D.   On: 09/14/2016 15:54     STUDIES:  CT Angio chest, abdomen 09/04/2016- ruptured 7.8 cm abdominal aortic aneurysm with a large RP hematoma. Moderate emphysema. No focal pneumonia, consolidation, opacities. CXR 09/01/16- COPD, no active cardio pulmonary disease All images personally reviewed.  CULTURES:   ANTIBIOTICS:   SIGNIFICANT EVENTS: 2/13- Admit, OR for ruptured AAA.   LINES/TUBES: ETT 2/13 Rt IJ cordis 2/13  DISCUSSION: Admit for acute AAA rupture status post stent repair Severely hypoxemic post procedure He may be developing ARDS from TRALI. I think some of the hypoxia is due to his distended abdomen, abdominal compartment syndrome pressing on the diaphragm. Volume overload is possible but CVP and PA pressures are not very elevated. He just came of multiple pressors so we will hold on diuresis for tonight and reassess in AM.  We will place on ARDS protocol starting at 8cc/kg TV. Plateau pressure is OK for now at 25.   Issues: Severe hypoxic resp failure Likely developing ARDS - Continue ARDS protocol. Reduce TV  From 8cc to 6cc/kg as tolerated -  Follow Plateau pressures, ABG and CXR - Continue full vent support for now while he has a open abdomen  Abdominal compartment syndrome -Open abdomen.  Need for sedation -Continue precedex -Fentanyl and versed PRN -Rass goal- 0  Critical care time- 35 mins  Chilton GreathousePraveen Rieley Khalsa MD [facility] Pager [phone] If no answer or after 3pm call: [phone] 08/27/2016, 9:32 PM

## 2016-09-02 NOTE — Anesthesia Postprocedure Evaluation (Signed)
Anesthesia Post Note  Patient: Michael Heath  Procedure(s) Performed: Procedure(s) (LRB): ABDOMINAL AORTIC ENDOVASCULAR STENT GRAFT (N/A) Abdominal Exploration APPLICATION OF WOUND VAC EVACUATION HEMATOMA  Patient location during evaluation: ICU Anesthesia Type: General Level of consciousness: sedated Pain management: pain level controlled Vital Signs Assessment: post-procedure vital signs reviewed and stable Respiratory status: patient remains intubated per anesthesia plan Cardiovascular status: stable Postop Assessment: no signs of nausea or vomiting Anesthetic complications: no       Last Vitals:  Vitals:   08/29/2016 2045 09/01/2016 2100  BP:    Pulse: 66 62  Resp: 18 (!) 22  Temp: (!) 34.3 C (!) 34.3 C    Last Pain:  Vitals:   08/22/2016 1527  TempSrc: Oral  PainSc:                  Michael Heath

## 2016-09-02 NOTE — ED Notes (Signed)
PT left with [facility] at this time.  

## 2016-09-03 ENCOUNTER — Inpatient Hospital Stay (HOSPITAL_COMMUNITY): Payer: Medicare Other

## 2016-09-03 ENCOUNTER — Encounter (HOSPITAL_COMMUNITY): Admission: EM | Disposition: E | Payer: Self-pay | Source: Home / Self Care | Attending: Surgery

## 2016-09-03 ENCOUNTER — Encounter (HOSPITAL_COMMUNITY): Payer: Self-pay | Admitting: Gastroenterology

## 2016-09-03 ENCOUNTER — Inpatient Hospital Stay (HOSPITAL_COMMUNITY): Payer: Medicare Other | Admitting: Certified Registered"

## 2016-09-03 DIAGNOSIS — N179 Acute kidney failure, unspecified: Secondary | ICD-10-CM

## 2016-09-03 DIAGNOSIS — R778 Other specified abnormalities of plasma proteins: Secondary | ICD-10-CM

## 2016-09-03 DIAGNOSIS — I2510 Atherosclerotic heart disease of native coronary artery without angina pectoris: Secondary | ICD-10-CM

## 2016-09-03 DIAGNOSIS — R748 Abnormal levels of other serum enzymes: Secondary | ICD-10-CM

## 2016-09-03 DIAGNOSIS — K228 Other specified diseases of esophagus: Secondary | ICD-10-CM

## 2016-09-03 DIAGNOSIS — R1310 Dysphagia, unspecified: Secondary | ICD-10-CM

## 2016-09-03 DIAGNOSIS — J9601 Acute respiratory failure with hypoxia: Secondary | ICD-10-CM

## 2016-09-03 DIAGNOSIS — R7989 Other specified abnormal findings of blood chemistry: Secondary | ICD-10-CM

## 2016-09-03 DIAGNOSIS — K3189 Other diseases of stomach and duodenum: Secondary | ICD-10-CM

## 2016-09-03 HISTORY — PX: ESOPHAGOGASTRODUODENOSCOPY: SHX5428

## 2016-09-03 HISTORY — PX: LAPAROTOMY: SHX154

## 2016-09-03 LAB — POCT I-STAT 7, (LYTES, BLD GAS, ICA,H+H)
ACID-BASE DEFICIT: 4 mmol/L — AB (ref 0.0–2.0)
Acid-Base Excess: 4 mmol/L — ABNORMAL HIGH (ref 0.0–2.0)
Acid-base deficit: 18 mmol/L — ABNORMAL HIGH (ref 0.0–2.0)
Acid-base deficit: 10 mmol/L — ABNORMAL HIGH (ref 0.0–2.0)
BICARBONATE: 19.7 mmol/L — AB (ref 20.0–28.0)
BICARBONATE: 25 mmol/L (ref 20.0–28.0)
Bicarbonate: 13.1 mmol/L — ABNORMAL LOW (ref 20.0–28.0)
Bicarbonate: 33.1 mmol/L — ABNORMAL HIGH (ref 20.0–28.0)
CALCIUM ION: 0.34 mmol/L — AB (ref 1.15–1.40)
CALCIUM ION: 0.85 mmol/L — AB (ref 1.15–1.40)
Calcium, Ion: 0.88 mmol/L — CL (ref 1.15–1.40)
Calcium, Ion: 0.99 mmol/L — ABNORMAL LOW (ref 1.15–1.40)
HEMATOCRIT: 18 % — AB (ref 39.0–52.0)
HEMATOCRIT: 27 % — AB (ref 39.0–52.0)
HEMATOCRIT: 19 % — AB (ref 39.0–52.0)
HEMATOCRIT: 31 % — AB (ref 39.0–52.0)
HEMOGLOBIN: 6.1 g/dL — AB (ref 13.0–17.0)
HEMOGLOBIN: 9.2 g/dL — AB (ref 13.0–17.0)
Hemoglobin: 6.5 g/dL — CL (ref 13.0–17.0)
Hemoglobin: 10.5 g/dL — ABNORMAL LOW (ref 13.0–17.0)
O2 SAT: 100 %
O2 SAT: 100 %
O2 Saturation: 65 %
O2 Saturation: 79 %
PH ART: 7.186 — AB (ref 7.350–7.450)
POTASSIUM: 3.8 mmol/L (ref 3.5–5.1)
POTASSIUM: 4.3 mmol/L (ref 3.5–5.1)
Potassium: 5.2 mmol/L — ABNORMAL HIGH (ref 3.5–5.1)
Potassium: 4.2 mmol/L (ref 3.5–5.1)
SODIUM: 147 mmol/L — AB (ref 135–145)
SODIUM: 153 mmol/L — AB (ref 135–145)
SODIUM: 149 mmol/L — AB (ref 135–145)
Sodium: 149 mmol/L — ABNORMAL HIGH (ref 135–145)
TCO2: 15 mmol/L (ref 0–100)
TCO2: 35 mmol/L (ref 0–100)
TCO2: 22 mmol/L (ref 0–100)
TCO2: 27 mmol/L (ref 0–100)
pCO2 arterial: 65.7 mmHg (ref 32.0–48.0)
pCO2 arterial: 77.3 mmHg (ref 32.0–48.0)
pCO2 arterial: 75.3 mmHg (ref 32.0–48.0)
pCO2 arterial: 66.1 mmHg (ref 32.0–48.0)
pH, Arterial: 6.907 — CL (ref 7.350–7.450)
pH, Arterial: 7.239 — ABNORMAL LOW (ref 7.350–7.450)
pH, Arterial: 7.026 — CL (ref 7.350–7.450)
pO2, Arterial: 238 mmHg — ABNORMAL HIGH (ref 83.0–108.0)
pO2, Arterial: 56 mmHg — ABNORMAL LOW (ref 83.0–108.0)
pO2, Arterial: 65 mmHg — ABNORMAL LOW (ref 83.0–108.0)
pO2, Arterial: 411 mmHg — ABNORMAL HIGH (ref 83.0–108.0)

## 2016-09-03 LAB — COMPREHENSIVE METABOLIC PANEL
ALBUMIN: 2.6 g/dL — AB (ref 3.5–5.0)
ALK PHOS: 35 U/L — AB (ref 38–126)
ALT: 29 U/L (ref 17–63)
ANION GAP: 10 (ref 5–15)
AST: 45 U/L — ABNORMAL HIGH (ref 15–41)
BILIRUBIN TOTAL: 1.6 mg/dL — AB (ref 0.3–1.2)
BUN: 19 mg/dL (ref 6–20)
CO2: 29 mmol/L (ref 22–32)
Calcium: 6.9 mg/dL — ABNORMAL LOW (ref 8.9–10.3)
Chloride: 107 mmol/L (ref 101–111)
Creatinine, Ser: 1.94 mg/dL — ABNORMAL HIGH (ref 0.61–1.24)
GFR, EST AFRICAN AMERICAN: 38 mL/min — AB (ref >60)
GFR, EST NON AFRICAN AMERICAN: 33 mL/min — AB (ref >60)
GLUCOSE: 157 mg/dL — AB (ref 65–99)
Potassium: 3.3 mmol/L — ABNORMAL LOW (ref 3.5–5.1)
Sodium: 146 mmol/L — ABNORMAL HIGH (ref 135–145)
TOTAL PROTEIN: 4.6 g/dL — AB (ref 6.5–8.1)

## 2016-09-03 LAB — DIC (DISSEMINATED INTRAVASCULAR COAGULATION)PANEL
D-Dimer, Quant: >20 ug/mL-FEU — ABNORMAL HIGH (ref 0.00–0.50)
INR: 1.22
Prothrombin Time: 15.5 seconds — ABNORMAL HIGH (ref 11.4–15.2)
aPTT: 29 seconds (ref 24–36)

## 2016-09-03 LAB — CBC
HCT: 33.6 % — ABNORMAL LOW (ref 39.0–52.0)
HCT: 27.6 % — ABNORMAL LOW (ref 39.0–52.0)
HEMATOCRIT: 28.7 % — AB (ref 39.0–52.0)
HEMATOCRIT: 33.1 % — AB (ref 39.0–52.0)
HEMATOCRIT: 34.8 % — AB (ref 39.0–52.0)
HEMOGLOBIN: 10 g/dL — AB (ref 13.0–17.0)
HEMOGLOBIN: 11.2 g/dL — AB (ref 13.0–17.0)
HEMOGLOBIN: 11.2 g/dL — AB (ref 13.0–17.0)
HEMOGLOBIN: 12 g/dL — AB (ref 13.0–17.0)
HEMOGLOBIN: 9.3 g/dL — AB (ref 13.0–17.0)
MCH: 28.9 pg (ref 26.0–34.0)
MCH: 29.2 pg (ref 26.0–34.0)
MCH: 29.5 pg (ref 26.0–34.0)
MCH: 29.7 pg (ref 26.0–34.0)
MCH: 30.1 pg (ref 26.0–34.0)
MCHC: 33.3 g/dL (ref 30.0–36.0)
MCHC: 33.7 g/dL (ref 30.0–36.0)
MCHC: 33.8 g/dL (ref 30.0–36.0)
MCHC: 34.5 g/dL (ref 30.0–36.0)
MCHC: 34.8 g/dL (ref 30.0–36.0)
MCV: 86.1 fL (ref 78.0–100.0)
MCV: 86.2 fL (ref 78.0–100.0)
MCV: 86.4 fL (ref 78.0–100.0)
MCV: 86.6 fL (ref 78.0–100.0)
MCV: 87.6 fL (ref 78.0–100.0)
PLATELETS: 107 10*3/uL — AB (ref 150–400)
PLATELETS: 120 10*3/uL — AB (ref 150–400)
Platelets: 106 10*3/uL — ABNORMAL LOW (ref 150–400)
Platelets: 106 10*3/uL — ABNORMAL LOW (ref 150–400)
Platelets: 110 10*3/uL — ABNORMAL LOW (ref 150–400)
RBC: 4.04 MIL/uL — ABNORMAL LOW (ref 4.22–5.81)
RBC: 3.84 MIL/uL — ABNORMAL LOW (ref 4.22–5.81)
RBC: 3.32 MIL/uL — ABNORMAL LOW (ref 4.22–5.81)
RBC: 3.88 MIL/uL — AB (ref 4.22–5.81)
RBC: 3.15 MIL/uL — AB (ref 4.22–5.81)
RDW: 15.2 % (ref 11.5–15.5)
RDW: 15.5 % (ref 11.5–15.5)
RDW: 16 % — ABNORMAL HIGH (ref 11.5–15.5)
RDW: 16.3 % — ABNORMAL HIGH (ref 11.5–15.5)
RDW: 16.4 % — ABNORMAL HIGH (ref 11.5–15.5)
WBC: 11.7 10*3/uL — ABNORMAL HIGH (ref 4.0–10.5)
WBC: 10.4 10*3/uL (ref 4.0–10.5)
WBC: 15.3 10*3/uL — ABNORMAL HIGH (ref 4.0–10.5)
WBC: 14.8 10*3/uL — AB (ref 4.0–10.5)
WBC: 15.3 10*3/uL — ABNORMAL HIGH (ref 4.0–10.5)

## 2016-09-03 LAB — POCT I-STAT 3, ART BLOOD GAS (G3+)
ACID-BASE EXCESS: 4 mmol/L — AB (ref 0.0–2.0)
Acid-Base Excess: 7 mmol/L — ABNORMAL HIGH (ref 0.0–2.0)
Acid-Base Excess: 6 mmol/L — ABNORMAL HIGH (ref 0.0–2.0)
Acid-Base Excess: 5 mmol/L — ABNORMAL HIGH (ref 0.0–2.0)
Bicarbonate: 29.2 mmol/L — ABNORMAL HIGH (ref 20.0–28.0)
Bicarbonate: 29.5 mmol/L — ABNORMAL HIGH (ref 20.0–28.0)
Bicarbonate: 31.7 mmol/L — ABNORMAL HIGH (ref 20.0–28.0)
Bicarbonate: 32.6 mmol/L — ABNORMAL HIGH (ref 20.0–28.0)
O2 Saturation: 89 %
O2 Saturation: 89 %
O2 Saturation: 95 %
O2 Saturation: 99 %
PCO2 ART: 48.5 mmHg — AB (ref 32.0–48.0)
PH ART: 7.385 (ref 7.350–7.450)
PH ART: 7.435 (ref 7.350–7.450)
PO2 ART: 142 mmHg — AB (ref 83.0–108.0)
Patient temperature: 36.8
Patient temperature: 37.1
Patient temperature: 37.4
TCO2: 31 mmol/L (ref 0–100)
TCO2: 31 mmol/L (ref 0–100)
TCO2: 33 mmol/L (ref 0–100)
TCO2: 34 mmol/L (ref 0–100)
pCO2 arterial: 41.2 mmHg (ref 32.0–48.0)
pCO2 arterial: 49.4 mmHg — ABNORMAL HIGH (ref 32.0–48.0)
pCO2 arterial: 52.2 mmHg — ABNORMAL HIGH (ref 32.0–48.0)
pH, Arterial: 7.392 (ref 7.350–7.450)
pH, Arterial: 7.465 — ABNORMAL HIGH (ref 7.350–7.450)
pO2, Arterial: 54 mmHg — ABNORMAL LOW (ref 83.0–108.0)
pO2, Arterial: 59 mmHg — ABNORMAL LOW (ref 83.0–108.0)
pO2, Arterial: 81 mmHg — ABNORMAL LOW (ref 83.0–108.0)

## 2016-09-03 LAB — BASIC METABOLIC PANEL
ANION GAP: 8 (ref 5–15)
Anion gap: 7 (ref 5–15)
BUN: 21 mg/dL — ABNORMAL HIGH (ref 6–20)
BUN: 18 mg/dL (ref 6–20)
CALCIUM: 6.7 mg/dL — AB (ref 8.9–10.3)
CHLORIDE: 110 mmol/L (ref 101–111)
CO2: 29 mmol/L (ref 22–32)
CO2: 28 mmol/L (ref 22–32)
Calcium: 6.6 mg/dL — ABNORMAL LOW (ref 8.9–10.3)
Chloride: 110 mmol/L (ref 101–111)
Creatinine, Ser: 1.85 mg/dL — ABNORMAL HIGH (ref 0.61–1.24)
Creatinine, Ser: 1.55 mg/dL — ABNORMAL HIGH (ref 0.61–1.24)
GFR calc Af Amer: 41 mL/min — ABNORMAL LOW (ref >60)
GFR calc Af Amer: 50 mL/min — ABNORMAL LOW (ref >60)
GFR calc non Af Amer: 43 mL/min — ABNORMAL LOW (ref >60)
GFR, EST NON AFRICAN AMERICAN: 35 mL/min — AB (ref >60)
Glucose, Bld: 114 mg/dL — ABNORMAL HIGH (ref 65–99)
Glucose, Bld: 109 mg/dL — ABNORMAL HIGH (ref 65–99)
POTASSIUM: 3.5 mmol/L (ref 3.5–5.1)
POTASSIUM: 3.7 mmol/L (ref 3.5–5.1)
SODIUM: 147 mmol/L — AB (ref 135–145)
Sodium: 145 mmol/L (ref 135–145)

## 2016-09-03 LAB — PROTIME-INR
INR: 1.39
INR: 1.41
PROTHROMBIN TIME: 17.2 s — AB (ref 11.4–15.2)
Prothrombin Time: 17.4 seconds — ABNORMAL HIGH (ref 11.4–15.2)

## 2016-09-03 LAB — CREATININE, SERUM
Creatinine, Ser: 1.91 mg/dL — ABNORMAL HIGH (ref 0.61–1.24)
GFR, EST AFRICAN AMERICAN: 39 mL/min — AB (ref >60)
GFR, EST NON AFRICAN AMERICAN: 34 mL/min — AB (ref >60)

## 2016-09-03 LAB — MAGNESIUM
MAGNESIUM: 1.4 mg/dL — AB (ref 1.7–2.4)
MAGNESIUM: 1.9 mg/dL (ref 1.7–2.4)
Magnesium: 1.6 mg/dL — ABNORMAL LOW (ref 1.7–2.4)

## 2016-09-03 LAB — PREPARE PLATELET PHERESIS
UNIT DIVISION: 0
Unit division: 0
Unit division: 0

## 2016-09-03 LAB — GLUCOSE, CAPILLARY
GLUCOSE-CAPILLARY: 103 mg/dL — AB (ref 65–99)
Glucose-Capillary: 152 mg/dL — ABNORMAL HIGH (ref 65–99)
Glucose-Capillary: 82 mg/dL (ref 65–99)
Glucose-Capillary: 121 mg/dL — ABNORMAL HIGH (ref 65–99)
Glucose-Capillary: 101 mg/dL — ABNORMAL HIGH (ref 65–99)
Glucose-Capillary: 103 mg/dL — ABNORMAL HIGH (ref 65–99)

## 2016-09-03 LAB — PREPARE CRYOPRECIPITATE
Blood Product Expiration Date: 201802132313
ISSUE DATE / TIME: 201802131737
UNIT TYPE AND RH: 5100

## 2016-09-03 LAB — POCT I-STAT EG7
Acid-base deficit: 12 mmol/L — ABNORMAL HIGH (ref 0.0–2.0)
Bicarbonate: 19.3 mmol/L — ABNORMAL LOW (ref 20.0–28.0)
Calcium, Ion: 0.82 mmol/L — CL (ref 1.15–1.40)
HEMATOCRIT: 19 % — AB (ref 39.0–52.0)
HEMOGLOBIN: 6.5 g/dL — AB (ref 13.0–17.0)
O2 Saturation: 100 %
POTASSIUM: 3.8 mmol/L (ref 3.5–5.1)
Sodium: 151 mmol/L — ABNORMAL HIGH (ref 135–145)
TCO2: 22 mmol/L (ref 0–100)
pCO2, Ven: 83.8 mmHg (ref 44.0–60.0)
pH, Ven: 6.971 — CL (ref 7.250–7.430)
pO2, Ven: 393 mmHg — ABNORMAL HIGH (ref 32.0–45.0)

## 2016-09-03 LAB — TYPE AND SCREEN
ABO/RH(D): A NEG
ANTIBODY SCREEN: NEGATIVE
UNIT DIVISION: 0
Unit division: 0

## 2016-09-03 LAB — PHOSPHORUS
Phosphorus: 3.9 mg/dL (ref 2.5–4.6)
Phosphorus: 4.1 mg/dL (ref 2.5–4.6)
Phosphorus: 3.3 mg/dL (ref 2.5–4.6)

## 2016-09-03 LAB — PREPARE RBC (CROSSMATCH)

## 2016-09-03 LAB — HEMOGLOBIN AND HEMATOCRIT, BLOOD
HCT: 30.3 % — ABNORMAL LOW (ref 39.0–52.0)
Hemoglobin: 10.5 g/dL — ABNORMAL LOW (ref 13.0–17.0)

## 2016-09-03 LAB — HEPATIC FUNCTION PANEL
ALK PHOS: 33 U/L — AB (ref 38–126)
ALT: 22 U/L (ref 17–63)
AST: 36 U/L (ref 15–41)
Albumin: 2.1 g/dL — ABNORMAL LOW (ref 3.5–5.0)
BILIRUBIN DIRECT: 0.3 mg/dL (ref 0.1–0.5)
Indirect Bilirubin: 0.9 mg/dL (ref 0.3–0.9)
TOTAL PROTEIN: 4.1 g/dL — AB (ref 6.5–8.1)
Total Bilirubin: 1.2 mg/dL (ref 0.3–1.2)

## 2016-09-03 LAB — MASSIVE TRANSFUSION PROTOCOL ORDER (BLOOD BANK NOTIFICATION)

## 2016-09-03 LAB — ABO/RH: ABO/RH(D): A NEG

## 2016-09-03 LAB — BLOOD PRODUCT ORDER (VERBAL) VERIFICATION

## 2016-09-03 LAB — DIC (DISSEMINATED INTRAVASCULAR COAGULATION) PANEL
FIBRINOGEN: 294 mg/dL (ref 210–475)
PLATELETS: 98 10*3/uL — AB (ref 150–400)
SMEAR REVIEW: NONE SEEN

## 2016-09-03 LAB — TROPONIN I
TROPONIN I: 0.16 ng/mL — AB (ref <0.03)
TROPONIN I: 0.11 ng/mL — AB (ref <0.03)
Troponin I: 0.12 ng/mL (ref <0.03)
Troponin I: 0.17 ng/mL (ref <0.03)

## 2016-09-03 LAB — MRSA PCR SCREENING: MRSA by PCR: NEGATIVE

## 2016-09-03 LAB — AMYLASE: AMYLASE: 63 U/L (ref 28–100)

## 2016-09-03 LAB — LACTIC ACID, PLASMA: Lactic Acid, Venous: 1.3 mmol/L (ref 0.5–1.9)

## 2016-09-03 SURGERY — EGD (ESOPHAGOGASTRODUODENOSCOPY)
Anesthesia: Moderate Sedation

## 2016-09-03 SURGERY — LAPAROTOMY, EXPLORATORY
Anesthesia: General | Site: Abdomen

## 2016-09-03 MED ORDER — LACTATED RINGERS IV SOLN
INTRAVENOUS | Status: DC | PRN
  Administered 2016-09-03: 18:00:00 via INTRAVENOUS

## 2016-09-03 MED ORDER — FENTANYL CITRATE (PF) 100 MCG/2ML IJ SOLN
INTRAMUSCULAR | Status: DC | PRN
  Administered 2016-09-03 (×2): 100 ug via INTRAVENOUS
  Administered 2016-09-03: 50 ug via INTRAVENOUS

## 2016-09-03 MED ORDER — PROPOFOL 10 MG/ML IV BOLUS
INTRAVENOUS | Status: AC
  Filled 2016-09-03: qty 20

## 2016-09-03 MED ORDER — SODIUM CHLORIDE 0.9 % IV SOLN
INTRAVENOUS | Status: DC | PRN
  Administered 2016-09-03: 500 mL

## 2016-09-03 MED ORDER — SODIUM CHLORIDE 0.9 % IV SOLN
Freq: Once | INTRAVENOUS | Status: AC
  Administered 2016-09-03: 15:00:00 via INTRAVENOUS

## 2016-09-03 MED ORDER — DEXTROSE 5 % IV SOLN
1.5000 g | Freq: Once | INTRAVENOUS | Status: DC
  Filled 2016-09-03: qty 1.5

## 2016-09-03 MED ORDER — MIDAZOLAM BOLUS VIA INFUSION
1.0000 mg | INTRAVENOUS | Status: DC | PRN
  Filled 2016-09-03: qty 2

## 2016-09-03 MED ORDER — ROCURONIUM BROMIDE 100 MG/10ML IV SOLN
INTRAVENOUS | Status: DC | PRN
  Administered 2016-09-03 (×3): 50 mg via INTRAVENOUS

## 2016-09-03 MED ORDER — ROCURONIUM BROMIDE 50 MG/5ML IV SOSY
PREFILLED_SYRINGE | INTRAVENOUS | Status: AC
  Filled 2016-09-03: qty 25

## 2016-09-03 MED ORDER — SODIUM CHLORIDE 0.9 % IV SOLN
0.0000 mg/h | INTRAVENOUS | Status: DC
  Administered 2016-09-03 (×2): 4 mg/h via INTRAVENOUS
  Administered 2016-09-04: 6 mg/h via INTRAVENOUS
  Administered 2016-09-05: 2 mg/h via INTRAVENOUS
  Administered 2016-09-06: 1 mg/h via INTRAVENOUS
  Filled 2016-09-03 (×7): qty 10

## 2016-09-03 MED ORDER — SODIUM CHLORIDE 0.9 % IV SOLN
0.0000 ug/min | INTRAVENOUS | Status: DC
  Administered 2016-09-03: 10 ug/min via INTRAVENOUS
  Administered 2016-09-03: 50 ug/min via INTRAVENOUS
  Filled 2016-09-03 (×3): qty 1

## 2016-09-03 MED ORDER — ENOXAPARIN SODIUM 40 MG/0.4ML ~~LOC~~ SOLN
40.0000 mg | SUBCUTANEOUS | Status: DC
  Administered 2016-09-04 – 2016-09-13 (×10): 40 mg via SUBCUTANEOUS
  Filled 2016-09-03 (×10): qty 0.4

## 2016-09-03 MED ORDER — PANTOPRAZOLE SODIUM 40 MG IV SOLR
40.0000 mg | INTRAVENOUS | Status: DC
  Administered 2016-09-03: 40 mg via INTRAVENOUS
  Filled 2016-09-03: qty 40

## 2016-09-03 MED ORDER — FENTANYL CITRATE (PF) 250 MCG/5ML IJ SOLN
INTRAMUSCULAR | Status: AC
  Filled 2016-09-03: qty 5

## 2016-09-03 MED ORDER — 0.9 % SODIUM CHLORIDE (POUR BTL) OPTIME
TOPICAL | Status: DC | PRN
Start: 2016-09-03 — End: 2016-09-03
  Administered 2016-09-03: 2000 mL

## 2016-09-03 MED ORDER — ARTIFICIAL TEARS OP OINT: TOPICAL_OINTMENT | OPHTHALMIC | Status: DC | PRN

## 2016-09-03 MED ORDER — CEFUROXIME SODIUM 1.5 G IJ SOLR
INTRAMUSCULAR | Status: DC | PRN
  Administered 2016-09-03: 1.5 g via INTRAVENOUS

## 2016-09-03 MED ORDER — MIDAZOLAM HCL 5 MG/5ML IJ SOLN
INTRAMUSCULAR | Status: DC | PRN
  Administered 2016-09-03: 2 mg via INTRAVENOUS

## 2016-09-03 MED ORDER — PHENYLEPHRINE HCL 10 MG/ML IJ SOLN
0.0000 ug/min | INTRAMUSCULAR | Status: DC
  Administered 2016-09-03: 60 ug/min via INTRAVENOUS
  Administered 2016-09-04: 125 ug/min via INTRAVENOUS
  Administered 2016-09-04: 165 ug/min via INTRAVENOUS
  Administered 2016-09-04: 90 ug/min via INTRAVENOUS
  Administered 2016-09-05 (×2): 60 ug/min via INTRAVENOUS
  Administered 2016-09-06: 25 ug/min via INTRAVENOUS
  Filled 2016-09-03 (×8): qty 4

## 2016-09-03 MED ORDER — MIDAZOLAM HCL 2 MG/2ML IJ SOLN
INTRAMUSCULAR | Status: AC
  Filled 2016-09-03: qty 2

## 2016-09-03 MED ORDER — PANTOPRAZOLE SODIUM 40 MG IV SOLR
40.0000 mg | Freq: Two times a day (BID) | INTRAVENOUS | Status: DC
  Administered 2016-09-03 – 2016-09-15 (×25): 40 mg via INTRAVENOUS
  Filled 2016-09-03 (×25): qty 40

## 2016-09-03 SURGICAL SUPPLY — 49 items
CANISTER SUCTION 2500CC (MISCELLANEOUS) ×4 IMPLANT
CLIP TI MEDIUM 24 (CLIP) ×4 IMPLANT
CLIP TI WIDE RED SMALL 24 (CLIP) ×4 IMPLANT
COVER MAYO STAND STRL (DRAPES) ×4 IMPLANT
DERMABOND ADVANCED (GAUZE/BANDAGES/DRESSINGS) ×2
DERMABOND ADVANCED .7 DNX12 (GAUZE/BANDAGES/DRESSINGS) ×2 IMPLANT
ELECT BLADE 4.0 EZ CLEAN MEGAD (MISCELLANEOUS) ×4
ELECT BLADE 6.5 EXT (BLADE) IMPLANT
ELECT REM PT RETURN 9FT ADLT (ELECTROSURGICAL) ×4
ELECTRODE BLDE 4.0 EZ CLN MEGD (MISCELLANEOUS) ×2 IMPLANT
ELECTRODE REM PT RTRN 9FT ADLT (ELECTROSURGICAL) ×2 IMPLANT
FELT TEFLON 4 X1 (Mesh General) IMPLANT
GEL ULTRASOUND 20GR AQUASONIC (MISCELLANEOUS) IMPLANT
GLOVE BIO SURGEON STRL SZ 6.5 (GLOVE) ×3 IMPLANT
GLOVE BIO SURGEONS STRL SZ 6.5 (GLOVE) ×1
GLOVE BIOGEL PI IND STRL 6.5 (GLOVE) ×2 IMPLANT
GLOVE BIOGEL PI IND STRL 7.5 (GLOVE) ×2 IMPLANT
GLOVE BIOGEL PI INDICATOR 6.5 (GLOVE) ×2
GLOVE BIOGEL PI INDICATOR 7.5 (GLOVE) ×2
GLOVE SURG SS PI 7.5 STRL IVOR (GLOVE) ×4 IMPLANT
GOWN STRL REUS W/ TWL LRG LVL3 (GOWN DISPOSABLE) ×4 IMPLANT
GOWN STRL REUS W/ TWL XL LVL3 (GOWN DISPOSABLE) ×2 IMPLANT
GOWN STRL REUS W/TWL LRG LVL3 (GOWN DISPOSABLE) ×4
GOWN STRL REUS W/TWL XL LVL3 (GOWN DISPOSABLE) ×2
HEMOSTAT SNOW SURGICEL 2X4 (HEMOSTASIS) IMPLANT
INSERT FOGARTY 61MM (MISCELLANEOUS) ×4 IMPLANT
INSERT FOGARTY SM (MISCELLANEOUS) ×8 IMPLANT
KIT BASIN OR (CUSTOM PROCEDURE TRAY) ×4 IMPLANT
KIT ROOM TURNOVER OR (KITS) ×4 IMPLANT
NS IRRIG 1000ML POUR BTL (IV SOLUTION) ×8 IMPLANT
PACK AORTA (CUSTOM PROCEDURE TRAY) ×4 IMPLANT
PAD ARMBOARD 7.5X6 YLW CONV (MISCELLANEOUS) ×8 IMPLANT
SUT ETHIBOND 5 LR DA (SUTURE) IMPLANT
SUT PDS AB 1 TP1 54 (SUTURE) ×4 IMPLANT
SUT PROLENE 0 CTX CR/8 (SUTURE) ×12 IMPLANT
SUT PROLENE 3 0 SH 48 (SUTURE) ×4 IMPLANT
SUT PROLENE 5 0 C 1 24 (SUTURE) ×8 IMPLANT
SUT PROLENE 5 0 C 1 36 (SUTURE) IMPLANT
SUT SILK 2 0SH CR/8 30 (SUTURE) IMPLANT
SUT VIC AB 2-0 CT1 27 (SUTURE)
SUT VIC AB 2-0 CT1 TAPERPNT 27 (SUTURE) IMPLANT
SUT VIC AB 2-0 CTX 36 (SUTURE) ×4 IMPLANT
SUT VIC AB 3-0 SH 27 (SUTURE) ×2
SUT VIC AB 3-0 SH 27X BRD (SUTURE) ×2 IMPLANT
SUT VIC AB 4-0 PS2 18 (SUTURE) ×4 IMPLANT
SUT VICRYL 4-0 PS2 18IN ABS (SUTURE) ×4 IMPLANT
TOWEL BLUE STERILE X RAY DET (MISCELLANEOUS) ×8 IMPLANT
TRAY FOLEY W/METER SILVER 16FR (SET/KITS/TRAYS/PACK) ×4 IMPLANT
WATER STERILE IRR 1000ML POUR (IV SOLUTION) ×4 IMPLANT

## 2016-09-03 NOTE — Progress Notes (Signed)
RT note: sputum sample obtained and sent down to main lab without complications. 

## 2016-09-03 NOTE — Progress Notes (Signed)
PULMONARY / CRITICAL CARE MEDICINE   Name: Michael Heath MRN: [id number] DOB: 03-19-1946    ADMISSION DATE:  09/14/2016 CONSULTATION DATE:  09/01/2016  REFERRING MD:  Coral Else MD  CHIEF COMPLAINT:  Ruptured AAA aneurysm status post repair, acute hypoxic respiratory failure.  HISTORY OF PRESENT ILLNESS:   71 year old with past medical history of hypertension, hyperlipidemia, CKD, stage III. Admitted today with right flank, back pain. A CTA which showed ruptured AAA. He was transferred emergently to [facility] for stent repair. In the OR he received 16 units PRBC, 10 units FFP, 3 units platelets, 1 unit cryo and 1 Cell Saver. He developed abdominal comparment syndrome in OR from massive RP bleed. He is brought back to the ICU with an open abdomen. PCCM consulted for vent, sedation management.   SUBJECTIVE:  Pt kept on the vent post op. BP stable on neo at 50 mcg/kg/min Making urine Wound vac with sanguinous discharge Awake, in pain   VITAL SIGNS: BP 99/62   Pulse 74   Temp 98.6 F (37 C)   Resp (!) 26   Ht 6\' 1"  (1.854 m)   Wt 100.7 kg (222 lb)   SpO2 93%   BMI 29.29 kg/m   HEMODYNAMICS: PAP: (30-45)/(20-31) 33/21 CVP:  [13 mmHg-24 mmHg] 13 mmHg CO:  [3.1 L/min-7.6 L/min] 4 L/min CI:  [1.4 L/min/m2-3.4 L/min/m2] 1.8 L/min/m2  VENTILATOR SETTINGS: Vent Mode: PRVC FiO2 (%):  [60 %-70 %] 60 % Set Rate:  [22 bmp-25 bmp] 25 bmp Vt Set:  [480 mL-640 mL] 480 mL PEEP:  [5 cmH20-12 cmH20] 10 cmH20 Plateau Pressure:  [21 cmH20-25 cmH20] 23 cmH20  INTAKE / OUTPUT: I/O last 3 completed shifts: In: 13605.6 [I.V.:3436.6; Blood:9819; IV Piggyback:350] Out: 4910 [Urine:1360; Drains:2750; Blood:800]  PHYSICAL EXAMINATION: General:  Sedated, opens eyes every now and then, followed simple commands, NAD Neuro: No focal deficits. CN grossly intact HEENT:  No thyromegaly, JVD. ETT in place.  Cardiovascular:  Regular rate and rhythm, no murmurs rubs gallops Lungs: Good ae. Some crackles  at bases. (-) rhonchi/wheezes Abdomen:  Open abdomen, wound VAC in place with sanguinous d/c Musculoskeletal:  Normal tone and bulk Skin: Intact. (-) rash. Trace edema  LABS:  BMET  Recent Labs Lab 09/13/2016 1342 09/03/2016 1503  09/04/2016 1916 09/11/2016 2007 08/29/2016 0345  NA 136 138  < > 149* 147* 146*  K 3.8 3.9  < > 4.2 3.8 3.3*  CL 101 102  --   --  109 107  CO2 18*  --   --   --  22 29  BUN 23* 22*  --   --  16 19  CREATININE 2.45* 2.40*  --   --  2.04* 1.94*  GLUCOSE 287* 286*  --   --  194* 157*  < > = values in this interval not displayed.  Electrolytes  Recent Labs Lab 09/03/2016 1342 08/24/2016 2007 08/31/2016 0345  CALCIUM 8.4* 7.2* 6.9*  MG  --  1.5* 1.4*  PHOS  --   --  3.9    CBC  Recent Labs Lab 09/07/2016 2007 09/05/2016 2338 09/06/2016 0345  WBC 12.4* 11.7* 10.4  HGB 12.5* 12.0* 11.2*  HCT 36.7* 34.8* 33.1*  PLT 125* 106* 106*    Coag's  Recent Labs Lab 08/26/2016 1459 09/01/2016 1739 09/17/2016 2007  APTT 27 58* 43*  INR 1.14 1.81 1.45    Sepsis Markers  Recent Labs Lab 08/21/2016 1509  LATICACIDVEN 9.75*    ABG  Recent Labs Lab  08/29/2016 2221 09/01/2016 2336 08/23/2016 0353  PHART 7.466* 7.445 7.435  PCO2ART 30.0* 38.1 48.5*  PO2ART 47.0* 71.0* 142.0*    Liver Enzymes  Recent Labs Lab 08/22/2016 1459 08/30/2016 0345  AST 28 45*  ALT 22 29  ALKPHOS 37* 35*  BILITOT 0.8 1.6*  ALBUMIN 3.0* 2.6*    Cardiac Enzymes  Recent Labs Lab 08/30/2016 2230  TROPONINI 0.12*    Glucose  Recent Labs Lab 09/17/2016 2233 08/22/2016 2342 09/13/2016 0355  GLUCAP 229* 207* 152*    Imaging Dg Chest Port 1 View  Result Date: 09/15/2016 CLINICAL DATA:  Endotracheal tube EXAM: PORTABLE CHEST 1 VIEW COMPARISON:  Yesterday FINDINGS: Endotracheal tube tip is at the clavicular heads. There is a Swan-Ganz catheter by right IJ approach which is coiled at the level of the main pulmonary artery. Worsening aeration at the left base with poorly defined  diaphragm. Generalized interstitial opacity. Chronic cardiopericardial enlargement. No pneumothorax. IMPRESSION: 1. Unchanged positioning of endotracheal and orogastric tubes as described. 2. Cardiomegaly and pulmonary edema. Worsening retrocardiac aeration. Electronically Signed   By: Marnee SpringJonathon  Watts M.D.   On: 08/31/2016 07:56   Dg Chest Port 1 View  Result Date: 08/25/2016 CLINICAL DATA:  Abdominal aortic endovascular stent grafting today. EXAM: PORTABLE CHEST 1 VIEW COMPARISON:  09/01/2016 CXR bold FINDINGS: Slightly low-lying endotracheal tube tip approximately 2.3 cm above the carina. Pullback approximately 1 cm. All Swan-Ganz catheter seen from right IJ approach with tip projecting over left pulmonary artery towards the left lower lobe. Mild interstitial edema with atelectasis at the left lung base. No pulmonary consolidation. No pneumothorax. Cardiomegaly with mild aortic atherosclerosis. IMPRESSION: 1. Slightly low-lying endotracheal tube tip positioned 2.3 cm above the carina. Pullback approximately 1 cm. 2. Mild interstitial edema with borderline cardiomegaly and aortic atherosclerosis. Electronically Signed   By: Tollie Ethavid  Kwon M.D.   On: 09/07/2016 22:37   Dg Abd Portable 1v  Result Date: 09/11/2016 CLINICAL DATA:  Status post placement of the aortoiliac stent graft today. Initial encounter. EXAM: PORTABLE ABDOMEN - 1 VIEW COMPARISON:  CTA of the chest, abdomen and pelvis performed earlier today at 3:15 p.m. FINDINGS: The visualized bowel gas pattern is unremarkable. Scattered air and stool filled loops of colon are seen; no abnormal dilatation of small bowel loops is seen to suggest small bowel obstruction. An aortoiliac stent graft is noted. The underlying calcified wall of the recently ruptured abdominal aortic aneurysm is partially characterized. The visualized osseous structures are within normal limits; the sacroiliac joints are unremarkable in appearance. The visualized lung bases are  essentially clear. IMPRESSION: 1. Aortoiliac stent graft is grossly unremarkable in appearance, though not well assessed on radiograph. 2.  Unremarkable bowel gas pattern. Electronically Signed   By: Roanna RaiderJeffery  Chang M.D.   On: 09/13/2016 22:56   Ct Angio Chest/abd/pel For Dissection W And/or Wo Contrast  Result Date: 09/01/2016 CLINICAL DATA:  Right flank pain, hypotension EXAM: CT ANGIOGRAPHY CHEST, ABDOMEN AND PELVIS TECHNIQUE: Multidetector CT imaging through the chest, abdomen and pelvis was performed using the standard protocol during bolus administration of intravenous contrast. Multiplanar reconstructed images and MIPs were obtained and reviewed to evaluate the vascular anatomy. CONTRAST:  100 cc Isovue 370 COMPARISON:  09/01/2016 chest x-ray FINDINGS: CTA CHEST FINDINGS Cardiovascular: Preferential opacification of the thoracic aorta. Atherosclerosis of the thoracic aorta without aneurysm or dissection. No mediastinal hemorrhage, hematoma, or intramural hematoma. Atherosclerosis of the major branch vessels remain patent. Three-vessel arch anatomy. Native coronary atherosclerosis noted. Normal heart size. No pericardial  effusion. Mediastinum/Nodes: No adenopathy.  Small hiatal hernia evident. Lungs/Pleura: Background moderate emphysema pattern with apical blebs. No focal pneumonia, collapse or consolidation. Mild bibasilar nodular bronchovascular opacities, difficult to exclude mild bronchitis/bronchopneumonia. Trachea and central airways are patent. No pleural abnormality, effusion or pneumothorax. Musculoskeletal: Thoracic spondylosis evident. No acute osseous finding or fracture. Sternum intact. Review of the MIP images confirms the above findings. CTA ABDOMEN AND PELVIS FINDINGS VASCULAR Aorta: Infrarenal atherosclerotic abdominal aortic aneurysm measures 7 cm in AP dimension and 7.8 cm transverse. There is evidence of acute abdominal aortic aneurysm rupture with irregularity and displacement of the  wall calcifications on the right lateral margin of the aneurysm with contrast extravasation into the large right retroperitoneal hematoma. Retroperitoneal hematoma measures approximately 14.2 cm in AP dimension and 8.8 cm transverse, image 141. Celiac: Atherosclerosis and wall irregularity without occlusion. SMA: Minor atherosclerotic change without occlusion or stenosis. Renals: Single widely patent renal arteries with minor atherosclerosis. No accessory renal artery demonstrated. IMA: Not well demonstrated. Very diminutive in caliber. Difficult to assess for patency. Inflow: Atherosclerosis and tortuosity of the iliac vessels. The common, internal and external iliac arteries remain patent. No inflow disease or occlusion. 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 Veins: Venous imaging not performed. Review of the MIP images confirms the above findings. NON-VASCULAR Hepatobiliary: In the posterior right hepatic dome, there is an enhancing hypervascular indeterminate hepatic mass measuring 2.2 x 2.1 cm, image 94. Recommend follow-up nonemergent evaluation with MRI. No biliary obstruction. Cholelithiasis evident. Pancreas: Unremarkable. No pancreatic ductal dilatation or surrounding inflammatory changes. Spleen: Choose Adrenals/Urinary Tract: Nonspecific enhancement of the adrenal glands related to arterial phase imaging. Symmetric renal enhancement. Hypodense renal cysts noted in the right upper pole measuring 2 cm and in the left lower pole measuring 15 mm. No renal obstruction or hydronephrosis. No hydroureter or ureteral calculus appreciated. Urinary bladder collapsed. Stomach/Bowel: Negative for bowel obstruction, significant dilatation, ileus, or free air. Normal appendix. Scattered colonic diverticulosis, most pronounced in the sigmoid. No acute inflammatory process. No fluid collection or abscess. Lymphatic: No adenopathy. Reproductive: Symmetric seminal vesicles. Normal size  prostate. No significant finding. Other: No hernia.  Intact abdominal wall. Musculoskeletal: Lumbar spondylosis and lower lumbar facet arthropathy. No acute compression fracture. No acute osseous finding. Degenerative changes of the pelvis and hips. Review of the MIP images confirms the above findings. 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 Moderate pulmonary emphysema Small hiatal hernia Incidental renal cysts Colonic diverticulosis Cholelithiasis **An incidental finding of potential clinical significance has been found. Indeterminate enhancing posterior right hepatic dome 2.2 cm mass by arterial phase imaging. No available comparison studies. This warrants follow-up nonemergent MRI evaluation. These results were called by telephone at the time of interpretation on 09/28/16 at 3:51 pm to Dr. Crista Curb , who verbally acknowledged these results.** Electronically Signed   By: Judie Petit.  Shick M.D.   On: 28-Sep-2016 15:54     STUDIES:  CT Angio chest, abdomen 09-28-2016- ruptured 7.8 cm abdominal aortic aneurysm with a large RP hematoma. Moderate emphysema. No focal pneumonia, consolidation, opacities. CXR 09/01/16- COPD, no active cardio pulmonary disease All images personally reviewed.  CULTURES:   ANTIBIOTICS:   SIGNIFICANT EVENTS: 2/13- Admit, OR for ruptured AAA.   LINES/TUBES: ETT 2/13 Rt IJ cordis 2/13  DISCUSSION: Patient admitted  for acute AAA rupture and had endovascular repair of ruptured AAA and ex-lap for abdominal compartmetn syndrome. Abdomen was kept open after  procedure. Kept intubated post op. Patient with hypoxemia after receiveing significant amounts of blood products.    Acute Hypoxemic Respiratory Failure/ARDS 2/2 : possible TRALI with significant amount of blood products transfusion, Likely with Pulm edema with volume overload, Concern for demand ischemia with massive bleeding - Continue ARDS protocol.  On 60% FiO2 and PEEP 10. Keep o2 sats > 88% - Continue with sedation (versed and fentanyl drips). May need to be paralyzed if he will vent dyssynchrony - trend lactate - cycle troponin - will need Echo - check trache culture - no evidence for CAP for now.  Holding off on abx.   Abdominal compartment syndrome. S/P ExLap 2/13 -Open abdomen. -Per Surgery -will need NGT to decompress abdomen >> Surgery will ask GI   S/P Endovascular Repair of Ruptured AAA 2/13 - pt with an open abdomen and wound vac draining sanguinous d/c - check Hb and hct now then q6. Transfuse for Hb < 7 - check coags  and DIC panel now >> check for abnormal labs and see if we can transfuse accordingly to minimize bleeding  AKI - observe urine output. Making adequate urine - check lytes now - cont with IVF >> will dec to 75 mls/hr as he is now 8L (+)  Medical sales representative - on SCD - on PPI for SUP    I spent  45  minutes of Critical Care time with this patient today.  Family updated at bedside.  Spoke with wife and son.    Pollie Meyer, MD 08/25/2016, 9:15 AM [facility] Pager [phone] After 3 pm or if no answer, call [phone]

## 2016-09-03 NOTE — Procedures (Signed)
Arterial Catheter Insertion Procedure Note Michael AmenDavid T Nash [id number] [birth year]  Procedure: Insertion of Arterial Catheter  Indications: Blood pressure monitoring and Frequent blood sampling  Procedure Details Consent: Risks of procedure as well as the alternatives and risks of each were explained to the (patient/caregiver).  Consent for procedure obtained. Time Out: Verified patient identification, verified procedure, site/side was marked, verified correct patient position, special equipment/implants available, medications/allergies/relevent history reviewed, required imaging and test results available.  Performed  Maximum sterile technique was used including antiseptics, cap, gloves, gown, hand hygiene, mask and sheet. Skin prep: Chlorhexidine; local anesthetic administered 20 gauge catheter was inserted into left radial artery using the Seldinger technique.  Evaluation Blood flow good; BP tracing good. Complications: No apparent complications.   Tacy Learnurriff, Lenwood Balsam E 08/29/2016

## 2016-09-03 NOTE — Op Note (Signed)
[facility] Patient Name: Michael Heath Procedure Date : 08/26/2016 MRN: [id number] Attending MD: Napoleon Form , MD Date of Birth: [birth year] CSN: [id number] Age: 71 Admit Type: Inpatient Procedure:                Upper GI endoscopy Indications:              Dysphagia Providers:                Napoleon Form, MD, Priscella Mann, RN,                            Beryle Beams, Technician Referring MD:              Medicines:                None. Patient was already sedated on fentanyl gtt                            and Versed gtt Complications:            No immediate complications. Estimated Blood Loss:     Estimated blood loss was minimal. Procedure:                Pre-Anesthesia Assessment:                           - Prior to the procedure, a History and Physical                            was performed, and patient medications and                            allergies were reviewed. The patient's tolerance of                            previous anesthesia was also reviewed. The risks                            and benefits of the procedure and the sedation                            options and risks were discussed with the patient.                            All questions were answered, and informed consent                            was obtained. Prior Anticoagulants: The patient                            last took Lovenox (enoxaparin) on the day of the                            procedure. ASA Grade Assessment: V - A moribund  patient who is not expected to survive without the                            operation. After reviewing the risks and benefits,                            the patient was deemed in satisfactory condition to                            undergo the procedure.                           After obtaining informed consent, the endoscope was                            passed under direct vision. Throughout the                             procedure, the patient's blood pressure, pulse, and                            oxygen saturations were monitored continuously. The                            [id number] ([id number]) scope was introduced through the                            mouth, and advanced to the second part of duodenum.                            The upper GI endoscopy was accomplished without                            difficulty. The patient tolerated the procedure                            well. Scope In: Scope Out: Findings:      Patchy mild erythema was found in the entire esophagus. No obvious       esophageal stricture      Patchy severe erythematous mucosa with bleeding, congestion, linear       erosions and friability was found in the entire examined stomach.      Patchy moderately erythematous mucosa without active bleeding and with       no stigmata of bleeding with congestion and friability was found in the       duodenal bulb and 2nd part of duodenum.      An OG tube was placed under endoscopic guidance, position confirmed in       the antrum prior to scope withdrawal. Impression:               - Erythema in the esophagus.                           - Erythematous mucosa in the stomach.                           -  Erythematous duodenopathy.                           - An OG tube was placed.                           - No specimens collected. Moderate Sedation:      N/A Recommendation:           - Patient has a contact number available for                            emergencies. The signs and symptoms of potential                            delayed complications were discussed with the                            patient. Return to normal activities tomorrow.                            Written discharge instructions were provided to the                            patient.                           - Continue present medications.                           - No repeat upper  endoscopy.                           - per primary team                           - NPO.                           -Please call with any questions Procedure Code(s):        --- Professional ---                           [id number], Esophagogastroduodenoscopy, flexible,                            transoral; diagnostic, including collection of                            specimen(s) by brushing or washing, when performed                            (separate procedure) Diagnosis Code(s):        --- Professional ---                           K22.8, Other specified diseases of esophagus  K31.89, Other diseases of stomach and duodenum                           R13.10, Dysphagia, unspecified CPT copyright 2016 [employer]. All rights reserved. The codes documented in this report are preliminary and upon coder review may  be revised to meet current compliance requirements. Napoleon Form, MD 09/10/2016 2:20:13 PM This report has been signed electronically. Number of Addenda: 0

## 2016-09-03 NOTE — Progress Notes (Signed)
Paged MD Kasa regarding CI and soft BP. Orders received to restart neo for a MAP goal of 65 and to only notify if CI drops tremendously. Will continue to monitor patient.  Horton ChinMacKayla A Mahayla Haddaway, RN

## 2016-09-03 NOTE — Consult Note (Signed)
The patient has been seen in conjunction with Reino Bellis, NP-C. All aspects of care have been considered and discussed. The patient has been personally interviewed, examined, and all clinical data has been reviewed.   Agree with contents of the note as outlined below.  Echocardiogram will be performed to assess LV function and r/o RWMA  Based upon EKG and enzyme trend, we do not believe the patient has had an acute coronary syndrome. Elevated markers likely represent demand ischemia.  There is clinical evidence of coronary artery disease with calcification noted on CT angio.   ardiology Consult    Patient ID: Michael Heath MRN: [id number], DOB/AGE: Dec 05, 1945   Admit date: 09/15/2016 Date of Consult: 08/31/2016  Primary Physician: Kenn File, MD Primary Cardiologist: New Requesting Provider: Dr. Trula Slade Reason for Consultation: Elevated Trop  Patient Profile    71 yo with PMH of HL, HTN and tobacco abuse who presented to [facility] ED with right flank pain. Found to have a ruptured AAA on CTA and transferred to [facility] for emergent surgery. Now with trop elevation.   Past Medical History   History reviewed. No pertinent past medical history.  Past Surgical History:  Procedure Laterality Date  . CATARACT EXTRACTION W/PHACO Right 03/2010  . CATARACT EXTRACTION W/PHACO Left 02/24/2013   Procedure: CATARACT EXTRACTION PHACO AND INTRAOCULAR LENS PLACEMENT (IOC);  Surgeon: Tonny Branch, MD;  Location: [facility] ORS;  Service: Ophthalmology;  Laterality: Left;  CDE: 13.82  . EYE SURGERY       Allergies  No Known Allergies  History of Present Illness    Michael Heath is a 71 yo male with PMH of HL, HTN and tobacco abuse. No reports of previous cardiac work up in the past. Family reports he has been in his usual state of health up until a few days prior to admission. Was seen at his PCP for CAP, and intermittent lower extremity weakness. On 08/21/2016 developed right flank pain which prompted him  to go the ED at [facility]. There he underwent a CTA which showed a ruptured AAA. EKG showed SR with concern for septal infarct. He was tachycardiac and hypotensive. Started on IV fluids with transient improvement in blood pressure. Vascular surgery was consulted and he was transferred to [facility] emergently for surgery via [facility]. In the OR he received 16 units of PRBCs, 10 units of FFP 3 units of Platelets, 1 unit cryo and 1 cell saver. During surgery he developed abd compartment syndrome from massive RP bleed. Wound Vac was placed and he was brought back to the ICU with an open abd. Hgb 6.1 perioperatively.  Post surgery he was off pressors for short period of time, but became hypotensive requiring the addition of neo. Hgb 12.5 last evening, but declined to 10 today. GI consulted for placement of NG tube. Per family report, he has been having progressive dysphagia over the past couple of months. Second wound vac was placed today, but continues to have bleeding. Blood pressure dropped again this afternoon, requiring further titration of pressers. EKG post surgery shows SR. Review of telemetry shows some episodes of SVT. Trop 0.12>>0.17>>0.16   Inpatient Medications    . sodium chloride   Intravenous Once  . chlorhexidine gluconate (MEDLINE KIT)  15 mL Mouth Rinse BID  . docusate sodium  100 mg Oral Daily  . enoxaparin (LOVENOX) injection  40 mg Subcutaneous Q24H  . fentaNYL (SUBLIMAZE) injection  50 mcg Intravenous Once  . insulin aspart  1-3 Units Subcutaneous Q4H  .  mouth rinse  15 mL Mouth Rinse QID  . pantoprazole (PROTONIX) IV  40 mg Intravenous Q12H    Family History    Family History  Problem Relation Age of Onset  . Hypertension Father     Social History    Social History   Social History  . Marital status: Married    Spouse name: N/A  . Number of children: N/A  . Years of education: N/A   Occupational History  . Not on file.   Social History Main Topics  . Smoking status:  Current Every Day Smoker    Packs/day: 1.50    Years: 55.00    Types: Cigarettes  . Smokeless tobacco: Current User  . Alcohol use No  . Drug use: No  . Sexual activity: Not on file   Other Topics Concern  . Not on file   Social History Narrative  . No narrative on file     Review of Systems  Obtained from Chart:   General:  No chills, fever, night sweats or weight changes.  Cardiovascular:  No chest pain, dyspnea on exertion, edema, orthopnea, palpitations, paroxysmal nocturnal dyspnea. Respiratory: No cough, dyspnea Urologic: No hematuria, dysuria Abdominal:   No nausea, vomiting, diarrhea, bright red blood per rectum, melena, or hematemesis, ++ right flank pain Neurologic:  No visual changes, wkns, changes in mental status. All other systems reviewed and are otherwise negative except as noted above.  Physical Exam    Blood pressure 115/70, pulse 67, temperature 99 F (37.2 C), resp. rate (!) 25, height 6' 1"  (1.854 m), weight 222 lb (100.7 kg), SpO2 99 %.  General: Critically ill, intubated/sedated Neuro: Sedated HEENT: ETT in place  Neck: Supple, unable to assess JVD due to lines. Lungs:  Resp regular and unlabored, CTA. Heart: RRR no s3, s4, or murmurs. Abdomen: Open abd with 2 wound vacs in place, bleeding noted under dressing Extremities: No clubbing, cyanosis or edema. Pedal pulses 1+ and equal bilaterally.  Labs    Troponin [facility] of Care Test)  Recent Labs  09/15/2016 1501  TROPIPOC 0.01    Recent Labs  08/21/2016 2230 09/10/2016 0842 09/17/2016 1200  CKTOTAL 185  --   --   CKMB 6.5*  --   --   TROPONINI 0.12* 0.17* 0.16*   Lab Results  Component Value Date   WBC 15.3 (H) 09/07/2016   HGB 10.0 (L) 09/10/2016   HCT 28.7 (L) 09/01/2016   MCV 86.4 09/14/2016   PLT 110 (L) 09/07/2016     Recent Labs Lab 09/08/2016 0345 09/01/2016 0842 08/29/2016 1200  NA 146* 147*  --   K 3.3* 3.5  --   CL 107 110  --   CO2 29 29  --   BUN 19 21*  --   CREATININE  1.94* 1.85* 1.91*  CALCIUM 6.9* 6.7*  --   PROT 4.6*  --   --   BILITOT 1.6*  --   --   ALKPHOS 35*  --   --   ALT 29  --   --   AST 45*  --   --   GLUCOSE 157* 114*  --    Lab Results  Component Value Date   CHOL 152 05/01/2016   HDL 30 (L) 05/01/2016   LDLCALC 78 05/01/2016   TRIG 220 (H) 05/01/2016   Lab Results  Component Value Date   DDIMER 3.11 (H) 09/08/2016     Radiology Studies    Dg Chest 2 View  Result Date: 09/01/2016 CLINICAL DATA:  Community-acquired pneumonia left lower lobe. EXAM: CHEST  2 VIEW COMPARISON:  None. FINDINGS: COPD with mild hyperinflation of the lungs. Heart size and vascularity normal. Negative for infiltrate effusion or mass. No pleural effusion. Mild apical scarring bilaterally. IMPRESSION: No active cardiopulmonary disease. Electronically Signed   By: Franchot Gallo M.D.   On: 09/01/2016 11:34   Mr Brain Wo Contrast  Result Date: 08/25/2016 CLINICAL DATA:  Right leg weakness and tremors. EXAM: MRI HEAD WITHOUT CONTRAST TECHNIQUE: Multiplanar, multiecho pulse sequences of the brain and surrounding structures were obtained without intravenous contrast. COMPARISON:  None. FINDINGS: Brain: No focal diffusion restriction to indicate acute infarct. Small focus of high signal on the axial diffusion-weighted images (series 3, image 95) is not confirmed on the coronal DWI and favored to be artifactual. No intraparenchymal hemorrhage. There is multifocal hyperintense T2-weighted signal within the periventricular white matter, most often seen in the setting of chronic microvascular ischemia. No mass lesion or midline shift. No hydrocephalus or extra-axial fluid collection. The midline structures are normal. No age advanced or lobar predominant atrophy. Vascular: Major intracranial arterial and venous sinus flow voids are preserved. No evidence of chronic microhemorrhage or amyloid angiopathy. Skull and upper cervical spine: The visualized skull base, calvarium,  upper cervical spine and extracranial soft tissues are normal. Sinuses/Orbits: Right maxillary retention cyst. Bilateral lens replacements. IMPRESSION: Mild chronic microvascular ischemia without acute intracranial abnormality. Electronically Signed   By: Ulyses Jarred M.D.   On: 08/25/2016 16:12   Dg Chest Port 1 View  Result Date: 08/30/2016 CLINICAL DATA:  Endotracheal tube EXAM: PORTABLE CHEST 1 VIEW COMPARISON:  Yesterday FINDINGS: Endotracheal tube tip is at the clavicular heads. There is a Swan-Ganz catheter by right IJ approach which is coiled at the level of the main pulmonary artery. Worsening aeration at the left base with poorly defined diaphragm. Generalized interstitial opacity. Chronic cardiopericardial enlargement. No pneumothorax. IMPRESSION: 1. Unchanged positioning of endotracheal and orogastric tubes as described. 2. Cardiomegaly and pulmonary edema. Worsening retrocardiac aeration. Electronically Signed   By: Monte Fantasia M.D.   On: 09/05/2016 07:56   Dg Chest Port 1 View  Result Date: 08/31/2016 CLINICAL DATA:  Abdominal aortic endovascular stent grafting today. EXAM: PORTABLE CHEST 1 VIEW COMPARISON:  09/01/2016 CXR bold FINDINGS: Slightly low-lying endotracheal tube tip approximately 2.3 cm above the carina. Pullback approximately 1 cm. All Swan-Ganz catheter seen from right IJ approach with tip projecting over left pulmonary artery towards the left lower lobe. Mild interstitial edema with atelectasis at the left lung base. No pulmonary consolidation. No pneumothorax. Cardiomegaly with mild aortic atherosclerosis. IMPRESSION: 1. Slightly low-lying endotracheal tube tip positioned 2.3 cm above the carina. Pullback approximately 1 cm. 2. Mild interstitial edema with borderline cardiomegaly and aortic atherosclerosis. Electronically Signed   By: Ashley Royalty M.D.   On: 08/23/2016 22:37   Dg Abd Portable 1v  Result Date: 09/16/2016 CLINICAL DATA:  Status post placement of the  aortoiliac stent graft today. Initial encounter. EXAM: PORTABLE ABDOMEN - 1 VIEW COMPARISON:  CTA of the chest, abdomen and pelvis performed earlier today at 3:15 p.m. FINDINGS: The visualized bowel gas pattern is unremarkable. Scattered air and stool filled loops of colon are seen; no abnormal dilatation of small bowel loops is seen to suggest small bowel obstruction. An aortoiliac stent graft is noted. The underlying calcified wall of the recently ruptured abdominal aortic aneurysm is partially characterized. The visualized osseous structures are within normal limits; the sacroiliac joints  are unremarkable in appearance. The visualized lung bases are essentially clear. IMPRESSION: 1. Aortoiliac stent graft is grossly unremarkable in appearance, though not well assessed on radiograph. 2.  Unremarkable bowel gas pattern. Electronically Signed   By: Garald Balding M.D.   On: 09/07/2016 22:56   Ct Angio Chest/abd/pel For Dissection W And/or Wo Contrast  Result Date: 09/04/2016 CLINICAL DATA:  Right flank pain, hypotension EXAM: CT ANGIOGRAPHY CHEST, ABDOMEN AND PELVIS TECHNIQUE: Multidetector CT imaging through the chest, abdomen and pelvis was performed using the standard protocol during bolus administration of intravenous contrast. Multiplanar reconstructed images and MIPs were obtained and reviewed to evaluate the vascular anatomy. CONTRAST:  100 cc Isovue 370 COMPARISON:  09/01/2016 chest x-ray FINDINGS: CTA CHEST FINDINGS Cardiovascular: Preferential opacification of the thoracic aorta. Atherosclerosis of the thoracic aorta without aneurysm or dissection. No mediastinal hemorrhage, hematoma, or intramural hematoma. Atherosclerosis of the major branch vessels remain patent. Three-vessel arch anatomy. Native coronary atherosclerosis noted. Normal heart size. No pericardial effusion. Mediastinum/Nodes: No adenopathy.  Small hiatal hernia evident. Lungs/Pleura: Background moderate emphysema pattern with apical  blebs. No focal pneumonia, collapse or consolidation. Mild bibasilar nodular bronchovascular opacities, difficult to exclude mild bronchitis/bronchopneumonia. Trachea and central airways are patent. No pleural abnormality, effusion or pneumothorax. Musculoskeletal: Thoracic spondylosis evident. No acute osseous finding or fracture. Sternum intact. Review of the MIP images confirms the above findings. CTA ABDOMEN AND PELVIS FINDINGS VASCULAR Aorta: Infrarenal atherosclerotic abdominal aortic aneurysm measures 7 cm in AP dimension and 7.8 cm transverse. There is evidence of acute abdominal aortic aneurysm rupture with irregularity and displacement of the wall calcifications on the right lateral margin of the aneurysm with contrast extravasation into the large right retroperitoneal hematoma. Retroperitoneal hematoma measures approximately 14.2 cm in AP dimension and 8.8 cm transverse, image 141. Celiac: Atherosclerosis and wall irregularity without occlusion. SMA: Minor atherosclerotic change without occlusion or stenosis. Renals: Single widely patent renal arteries with minor atherosclerosis. No accessory renal artery demonstrated. IMA: Not well demonstrated. Very diminutive in caliber. Difficult to assess for patency. Inflow: Atherosclerosis and tortuosity of the iliac vessels. The common, internal and external iliac arteries remain patent. No inflow disease or occlusion. 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 Veins: Venous imaging not performed. Review of the MIP images confirms the above findings. NON-VASCULAR Hepatobiliary: In the posterior right hepatic dome, there is an enhancing hypervascular indeterminate hepatic mass measuring 2.2 x 2.1 cm, image 94. Recommend follow-up nonemergent evaluation with MRI. No biliary obstruction. Cholelithiasis evident. Pancreas: Unremarkable. No pancreatic ductal dilatation or surrounding inflammatory changes. Spleen: Choose Adrenals/Urinary Tract:  Nonspecific enhancement of the adrenal glands related to arterial phase imaging. Symmetric renal enhancement. Hypodense renal cysts noted in the right upper pole measuring 2 cm and in the left lower pole measuring 15 mm. No renal obstruction or hydronephrosis. No hydroureter or ureteral calculus appreciated. Urinary bladder collapsed. Stomach/Bowel: Negative for bowel obstruction, significant dilatation, ileus, or free air. Normal appendix. Scattered colonic diverticulosis, most pronounced in the sigmoid. No acute inflammatory process. No fluid collection or abscess. Lymphatic: No adenopathy. Reproductive: Symmetric seminal vesicles. Normal size prostate. No significant finding. Other: No hernia.  Intact abdominal wall. Musculoskeletal: Lumbar spondylosis and lower lumbar facet arthropathy. No acute compression fracture. No acute osseous finding. Degenerative changes of the pelvis and hips. Review of the MIP images confirms the above findings. 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 Moderate pulmonary emphysema Small hiatal hernia Incidental  renal cysts Colonic diverticulosis Cholelithiasis **An incidental finding of potential clinical significance has been found. Indeterminate enhancing posterior right hepatic dome 2.2 cm mass by arterial phase imaging. No available comparison studies. This warrants follow-up nonemergent MRI evaluation. These results were called by telephone at the time of interpretation on 09/12/2016 at 3:51 pm to Dr. Brantley Stage , who verbally acknowledged these results.** Electronically Signed   By: Jerilynn Mages.  Shick M.D.   On: 08/24/2016 15:54    ECG & Cardiac Imaging    EKG: Post procedure EKG 09/01/2016, SR with no evidence of prior infarction or ischemia.  Echo: Pending  Assessment & Plan    71 yo with PMH of HL, HTN and tobacco abuse who presented to [facility] ED with right flank pain. Found to have a ruptured AAA on CTA and  transferred to [facility] for emergent surgery. Now with trop elevation.  1. S/p ruptured AAA repair: Remains critically ill, intubated sedated on pressers. Second wound vac placed to abd, but continues to have bleeding with hematoma. Planned to receive 2 addition units of PRBCs.   2. Elevated Trop: Enzymes trend flat suggestive of demand ischemia in the setting of acute, critical illness and blood loss anemia.  Does have risk factors for CAD including and CA calcification on CTA. Other risk factors include  HTN, HL and tobacco abuse. Not a candidate for further ischemic work up at this time. No room to add on additional medical therapy at this time as he is hypotensive requiring the use of neo.  -- Echo has been ordered and pending.   3. Acute blood loss anemia: Hgb improved transiently post op, but now trending down. Planned for 2 addition PRBC units today.   4. AKI: Cr  2.4 on admission, now trending down. Good UOP.   Barnet Pall, NP-C Pager [phone] 08/21/2016, 1:58 PM

## 2016-09-03 NOTE — Transfer of Care (Signed)
Immediate Anesthesia Transfer of Care Note  Patient: Michael Heath  Procedure(s) Performed: Procedure(s): EXPLORATORY LAPAROTOMY (N/A)  Patient Location: SICU  Anesthesia Type:General  Level of Consciousness: Patient remains intubated per anesthesia plan  Airway & Oxygen Therapy: Patient placed on Ventilator (see vital sign flow sheet for setting)  Post-op Assessment: Report given to RN and Post -op Vital signs reviewed and stable  Post vital signs: Reviewed and stable  Last Vitals:  Vitals:   08/29/2016 1715 08/23/2016 1730  BP:    Pulse:  75  Resp: (!) 25 (!) 25  Temp: 37.5 C 37.7 C    Last Pain:  Vitals:   08/28/2016 0400  TempSrc: Core (Comment)  PainSc:          Complications: No apparent anesthesia complications

## 2016-09-03 NOTE — Progress Notes (Signed)
Paged MD Brabham regarding wound vac and leakage around site. Asked to place a tegaderm on site to ensure that it will not break seal. No other orders at this time. Will continue to monitor.  Horton ChinMacKayla A Veroncia Jezek, RN

## 2016-09-03 NOTE — Progress Notes (Signed)
[facility]Link Physician-Brief Progress Note Patient Name: Michael Heath DOB: 1946-02-17 MRN: [id number]   Date of Service  May 24, 2017  HPI/Events of Note  ABG on 80%/PRVC 25/TV 640/P 8 = 7.465/41.1/54/29.5.   eICU Interventions  Will order: 1. Change PRVC rate to 22 and increase PEEP to 12. 2. ABG at 12:15 PM.     Intervention Category Major Interventions: Acid-Base disturbance - evaluation and management;Respiratory failure - evaluation and management  Sommer,Steven Dennard Nipugene May 24, 2017, 10:04 PM

## 2016-09-03 NOTE — Consult Note (Signed)
[facility] Consult: 11:02 AM 08/23/2016  LOS: 1 day    Referring Provider: Dr Trula Slade  Primary Care Physician:  Kenn File, MD in [facility] Primary Gastroenterologist:  Althia Forts    Reason for Consultation:  Difficulty placing NG tube.   HPI: Michael Heath is a 71 y.o. male.  PMH hyperlipidemia.  HTN.  Smoker.  CKG, stage III as of 01/2016. Vitamin D deficiency cataract surgery.  Seen by PMD on 08/25/16 for episode of right lower extremity weakness that it happened for several hours on 2/12. He had fallen twice at home that had no LOC. Dr. Wendi Snipes presumed to symptoms were from TIA.  His leg strength was full on exam. Patient underwent MRI on 2/5 which did not confirm any stroke. He planned outpatient neurology referral. M.D. also diagnosed him with CAP earlier this week and began a course of Levaquin.  Presented to the ED on 08/22/2016 with 1 day of right flank pain. CT angiogram showed ruptured AAA.  Went to emergent surgery for this. Patient's abdomen is open with wound VAC applied due to compartment syndrome. In the OR he received 16 units of packed red cells, 10 units FFP, 3 units platelets, 1 unit cryoprecipitate and 1 unit Cell Saver.  He is intubated on vent for acute respiratory failure. Several staff members have tried to place NG tube but were unsuccessful. Patient is too sick to transport to radiology for attempted tube placement. Dr. Trula Slade is wondering if GI can endoscope him and possibly place an NG tube at the bedside.. Patient has a couple of years history history of dysphagia which never was brought to the attention of his physician.  It's gotten to the point where they do not go out to dinner because he has so much trouble swallowing.  Note that the CT scan had incidental finding  of a 2.2 centimeter mass in the posterior right hepatic dome. Finding warrants nonemergent MRI. Patient has never undergone upper endoscopy or colonoscopy.    History reviewed. No pertinent past medical history.  Past Surgical History:  Procedure Laterality Date  . CATARACT EXTRACTION W/PHACO Right 03/2010  . CATARACT EXTRACTION W/PHACO Left 02/24/2013   Procedure: CATARACT EXTRACTION PHACO AND INTRAOCULAR LENS PLACEMENT (IOC);  Surgeon: Tonny Branch, MD;  Location: [facility];  Service: Ophthalmology;  Laterality: Left;  CDE: 13.82  . EYE SURGERY      Prior to Admission medications   Medication Sig Start Date End Date Taking? Authorizing Provider  atorvastatin (LIPITOR) 20 MG tablet Take 1 tablet (20 mg total) by mouth daily. 01/28/16  Yes Timmothy Euler, MD  HYDROcodone-homatropine [facility]) 5-1.5 MG/5ML syrup Take 5 mLs by mouth every 6 (six) hours as needed for cough. 09/01/16  Yes Timmothy Euler, MD  levofloxacin (LEVAQUIN) 500 MG tablet Take 500 mg by mouth daily.  08/25/16  Yes Historical Provider, MD  Vitamin D, Ergocalciferol, (DRISDOL) 50000 units CAPS capsule Take 1 capsule (50,000 Units total) by mouth every 7 (seven) days. 08/25/16  Yes Timmothy Euler, MD  aspirin EC 81  MG tablet Take 1 tablet (81 mg total) by mouth daily. 01/28/16   Timmothy Euler, MD    Scheduled Meds: . chlorhexidine gluconate (MEDLINE KIT)  15 mL Mouth Rinse BID  . docusate sodium  100 mg Oral Daily  . fentaNYL (SUBLIMAZE) injection  50 mcg Intravenous Once  . insulin aspart  1-3 Units Subcutaneous Q4H  . mouth rinse  15 mL Mouth Rinse QID  . pantoprazole (PROTONIX) IV  40 mg Intravenous Q24H   Infusions: . dextrose 5 % and 0.45 % NaCl with KCl 20 mEq/L 75 mL (09/08/2016 0916)  . fentaNYL infusion INTRAVENOUS 400 mcg/hr (08/25/2016 1053)  . midazolam (VERSED) infusion 4 mg/hr (09/06/2016 1000)  . phenylephrine (NEO-SYNEPHRINE) Adult infusion     PRN Meds: sodium chloride, acetaminophen **OR**  acetaminophen, bisacodyl, fentaNYL, fentaNYL (SUBLIMAZE) injection, fentaNYL (SUBLIMAZE) injection, hydrALAZINE, HYDROmorphone (DILAUDID) injection, labetalol, metoprolol, midazolam, ondansetron, phenol, potassium chloride   Allergies as of 09/01/2016  . (No Known Allergies)    No family history on file.  Social History   Social History  . Marital status: Married    Spouse name: N/A  . Number of children: N/A  . Years of education: N/A   Occupational History  . Not on file.   Social History Main Topics  . Smoking status: Current Every Day Smoker    Packs/day: 1.50    Years: 55.00    Types: Cigarettes  . Smokeless tobacco: Current User  . Alcohol use No  . Drug use: No  . Sexual activity: Not on file   Other Topics Concern  . Not on file   Social History Narrative  . No narrative on file    REVIEW OF SYSTEMS: Constitutional:  Unable to obtain complete review of systems due to patient's sedated on vent. ENT:  No nose bleeds Pulm:  Recent cough and diagnosis of CAP. CV:  No palpitations, no LE edema.  GU:  Oliguric since surgery GI:  Not able to gain any history other than hearsay that he's been having trouble swallowing Heme:  No unusual bleeding or bruising.  Developed mottling of the skin in the abdomen yesterday. Transfusions:  Do not see any history of anemia or of blood transfusions. Neuro:  Several hours episode of right lower extremity weakness last week. Derm:  No itching, no rash or sores.  Endocrine:  No sweats or chills.  No polyuria or dysuria Immunization:  Vaccination on 05/01/16. Travel:  None beyond local counties in last few months.    PHYSICAL EXAM: Vital signs in last 24 hours: Vitals:   09/16/2016 1015 08/22/2016 1030  BP:    Pulse: (!) 59 (!) 59  Resp: (!) 25 (!) 25  Temp: 98.2 F (36.8 C) 98.2 F (36.8 C)   Wt Readings from Last 3 Encounters:  09/13/2016 100.7 kg (222 lb)  09/01/16 99.7 kg (219 lb 12.8 oz)  08/25/16 101 kg (222 lb 9.6  oz)    General: Obese, critically ill appearing patient on vent. Head:  Pale, no asymmetry.  Eyes:  No scleral icterus, no conjunctival pallor. Ears:  Unable to assess hearing.  Nose:  No bleeding or discharge Mouth:  Intubated. Neck:  No mass, no thyromegaly. Lungs:  Some crackles at the bases no rhonchi or wheezes. On ventilator. Heart: RRR. No MRG. Abdomen:  Obese, open abdominal wound with wound VAC in place. Sanguinous discharge..   Rectal: Deferred.   Musc/Skeltl: No joint erythema or swelling.  Extremities:  Trace lower extremity edema. Feet  are warm.  Neurologic:  Sedated on vent, unresponsive. Skin:  No rash or sores.   Intake/Output from previous day: 02/13 0701 - 02/14 0700 In: 13818.1 [I.V.:3649.1; Blood:9819; IV Piggyback:350] Out: 2229 [Urine:1360; Drains:2750; Blood:800] Intake/Output this shift: Total I/O In: 637.3 [I.V.:587.3; IV Piggyback:50] Out: 350 [Urine:100; Drains:250]  LAB RESULTS:  Recent Labs  09/15/2016 2007 09/01/2016 2338 09/14/2016 0345 09/07/2016 0842  WBC 12.4* 11.7* 10.4  --   HGB 12.5* 12.0* 11.2* 10.5*  HCT 36.7* 34.8* 33.1* 30.3*  PLT 125* 106* 106*  --    BMET Lab Results  Component Value Date   NA 147 (H) 09/09/2016   NA 146 (H) 09/10/2016   NA 147 (H) 09/05/2016   K 3.5 08/23/2016   K 3.3 (L) 08/28/2016   K 3.8 08/30/2016   CL 110 09/11/2016   CL 107 09/16/2016   CL 109 09/15/2016   CO2 29 09/04/2016   CO2 29 08/24/2016   CO2 22 08/25/2016   GLUCOSE 114 (H) 09/05/2016   GLUCOSE 157 (H) 09/05/2016   GLUCOSE 194 (H) 08/21/2016   BUN 21 (H) 08/29/2016   BUN 19 09/11/2016   BUN 16 08/29/2016   CREATININE 1.85 (H) 09/09/2016   CREATININE 1.94 (H) 09/10/2016   CREATININE 2.04 (H) 09/01/2016   CALCIUM 6.7 (L) 08/30/2016   CALCIUM 6.9 (L) 08/24/2016   CALCIUM 7.2 (L) 08/29/2016   LFT  Recent Labs  08/30/2016 1459 09/12/2016 0345  PROT 7.3 4.6*  ALBUMIN 3.0* 2.6*  AST 28 45*  ALT 22 29  ALKPHOS 37* 35*  BILITOT 0.8  1.6*  BILIDIR 0.3  --   IBILI 0.5  --    PT/INR Lab Results  Component Value Date   INR 1.39 09/16/2016   INR 1.45 09/04/2016   INR 1.81 09/07/2016   Hepatitis Panel No results for input(s): HEPBSAG, HCVAB, HEPAIGM, HEPBIGM in the last 72 hours. C-Diff No components found for: CDIFF Lipase  No results found for: LIPASE  Drugs of Abuse  No results found for: LABOPIA, COCAINSCRNUR, LABBENZ, AMPHETMU, THCU, LABBARB   RADIOLOGY STUDIES: Dg Chest Port 1 View  Result Date: 08/22/2016 CLINICAL DATA:  Endotracheal tube EXAM: PORTABLE CHEST 1 VIEW COMPARISON:  Yesterday FINDINGS: Endotracheal tube tip is at the clavicular heads. There is a Swan-Ganz catheter by right IJ approach which is coiled at the level of the main pulmonary artery. Worsening aeration at the left base with poorly defined diaphragm. Generalized interstitial opacity. Chronic cardiopericardial enlargement. No pneumothorax. IMPRESSION: 1. Unchanged positioning of endotracheal and orogastric tubes as described. 2. Cardiomegaly and pulmonary edema. Worsening retrocardiac aeration. Electronically Signed   By: Monte Fantasia M.D.   On: 09/04/2016 07:56   Dg Chest Port 1 View  Result Date: 09/07/2016 CLINICAL DATA:  Abdominal aortic endovascular stent grafting today. EXAM: PORTABLE CHEST 1 VIEW COMPARISON:  09/01/2016 CXR bold FINDINGS: Slightly low-lying endotracheal tube tip approximately 2.3 cm above the carina. Pullback approximately 1 cm. All Swan-Ganz catheter seen from right IJ approach with tip projecting over left pulmonary artery towards the left lower lobe. Mild interstitial edema with atelectasis at the left lung base. No pulmonary consolidation. No pneumothorax. Cardiomegaly with mild aortic atherosclerosis. IMPRESSION: 1. Slightly low-lying endotracheal tube tip positioned 2.3 cm above the carina. Pullback approximately 1 cm. 2. Mild interstitial edema with borderline cardiomegaly and aortic atherosclerosis.  Electronically Signed   By: Ashley Royalty M.D.   On: 08/21/2016 22:37   Dg Abd Portable 1v  Result Date: 09/05/2016 CLINICAL DATA:  Status post placement of the aortoiliac stent graft today. Initial encounter. EXAM: PORTABLE ABDOMEN - 1 VIEW COMPARISON:  CTA of the chest, abdomen and pelvis performed earlier today at 3:15 p.m. FINDINGS: The visualized bowel gas pattern is unremarkable. Scattered air and stool filled loops of colon are seen; no abnormal dilatation of small bowel loops is seen to suggest small bowel obstruction. An aortoiliac stent graft is noted. The underlying calcified wall of the recently ruptured abdominal aortic aneurysm is partially characterized. The visualized osseous structures are within normal limits; the sacroiliac joints are unremarkable in appearance. The visualized lung bases are essentially clear. IMPRESSION: 1. Aortoiliac stent graft is grossly unremarkable in appearance, though not well assessed on radiograph. 2.  Unremarkable bowel gas pattern. Electronically Signed   By: Garald Balding M.D.   On: 09/13/2016 22:56   Ct Angio Chest/abd/pel For Dissection W And/or Wo Contrast  Result Date: 08/28/2016 CLINICAL DATA:  Right flank pain, hypotension EXAM: CT ANGIOGRAPHY CHEST, ABDOMEN AND PELVIS TECHNIQUE: Multidetector CT imaging through the chest, abdomen and pelvis was performed using the standard protocol during bolus administration of intravenous contrast. Multiplanar reconstructed images and MIPs were obtained and reviewed to evaluate the vascular anatomy. CONTRAST:  100 cc Isovue 370 COMPARISON:  09/01/2016 chest x-ray FINDINGS: CTA CHEST FINDINGS Cardiovascular: Preferential opacification of the thoracic aorta. Atherosclerosis of the thoracic aorta without aneurysm or dissection. No mediastinal hemorrhage, hematoma, or intramural hematoma. Atherosclerosis of the major branch vessels remain patent. Three-vessel arch anatomy. Native coronary atherosclerosis noted. Normal  heart size. No pericardial effusion. Mediastinum/Nodes: No adenopathy.  Small hiatal hernia evident. Lungs/Pleura: Background moderate emphysema pattern with apical blebs. No focal pneumonia, collapse or consolidation. Mild bibasilar nodular bronchovascular opacities, difficult to exclude mild bronchitis/bronchopneumonia. Trachea and central airways are patent. No pleural abnormality, effusion or pneumothorax. Musculoskeletal: Thoracic spondylosis evident. No acute osseous finding or fracture. Sternum intact. Review of the MIP images confirms the above findings. CTA ABDOMEN AND PELVIS FINDINGS VASCULAR Aorta: Infrarenal atherosclerotic abdominal aortic aneurysm measures 7 cm in AP dimension and 7.8 cm transverse. There is evidence of acute abdominal aortic aneurysm rupture with irregularity and displacement of the wall calcifications on the right lateral margin of the aneurysm with contrast extravasation into the large right retroperitoneal hematoma. Retroperitoneal hematoma measures approximately 14.2 cm in AP dimension and 8.8 cm transverse, image 141. Celiac: Atherosclerosis and wall irregularity without occlusion. SMA: Minor atherosclerotic change without occlusion or stenosis. Renals: Single widely patent renal arteries with minor atherosclerosis. No accessory renal artery demonstrated. IMA: Not well demonstrated. Very diminutive in caliber. Difficult to assess for patency. Inflow: Atherosclerosis and tortuosity of the iliac vessels. The common, internal and external iliac arteries remain patent. No inflow disease or occlusion. 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 Veins: Venous imaging not performed. Review of the MIP images confirms the above findings. NON-VASCULAR Hepatobiliary: In the posterior right hepatic dome, there is an enhancing hypervascular indeterminate hepatic mass measuring 2.2 x 2.1 cm, image 94. Recommend follow-up nonemergent evaluation with MRI. No biliary  obstruction. Cholelithiasis evident. Pancreas: Unremarkable. No pancreatic ductal dilatation or surrounding inflammatory changes. Spleen: Choose Adrenals/Urinary Tract: Nonspecific enhancement of the adrenal glands related to arterial phase imaging. Symmetric renal enhancement. Hypodense renal cysts noted in the right upper pole measuring 2 cm and in the left lower pole measuring 15 mm. No renal obstruction or hydronephrosis. No hydroureter or ureteral calculus appreciated. Urinary bladder collapsed. Stomach/Bowel: Negative for bowel obstruction, significant dilatation, ileus, or  free air. Normal appendix. Scattered colonic diverticulosis, most pronounced in the sigmoid. No acute inflammatory process. No fluid collection or abscess. Lymphatic: No adenopathy. Reproductive: Symmetric seminal vesicles. Normal size prostate. No significant finding. Other: No hernia.  Intact abdominal wall. Musculoskeletal: Lumbar spondylosis and lower lumbar facet arthropathy. No acute compression fracture. No acute osseous finding. Degenerative changes of the pelvis and hips. Review of the MIP images confirms the above findings. 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 Moderate pulmonary emphysema Small hiatal hernia Incidental renal cysts Colonic diverticulosis Cholelithiasis **An incidental finding of potential clinical significance has been found. Indeterminate enhancing posterior right hepatic dome 2.2 cm mass by arterial phase imaging. No available comparison studies. This warrants follow-up nonemergent MRI evaluation. These results were called by telephone at the time of interpretation on 08/23/2016 at 3:51 pm to Dr. Brantley Stage , who verbally acknowledged these results.** Electronically Signed   By: Jerilynn Mages.  Shick M.D.   On: 08/23/2016 15:54     IMPRESSION:   *  Long-standing dysphagia, never previously evaluated. Patient needing NG tube for med and  feeding access as well as for suctioning but staff unable to place NG tube. Question esophageal stricture.  *  Large ruptured AAA.  Status post emergent repair.  *  Incidental liver mass found on CT scan. Workup of this can be pursued with MRI down the road.    PLAN:     *  Bedside EGD. Today. Wife informed of plans.    Azucena Freed  09/15/2016, 11:02 AM Pager: [phone]

## 2016-09-03 NOTE — Anesthesia Postprocedure Evaluation (Signed)
Anesthesia Post Note  Patient: Michael Heath  Procedure(s) Performed: Procedure(s) (LRB): EXPLORATORY LAPAROTOMY (N/A)  Patient location during evaluation: SICU Anesthesia Type: General Level of consciousness: sedated Pain management: pain level controlled Vital Signs Assessment: post-procedure vital signs reviewed and stable Respiratory status: patient remains intubated per anesthesia plan Cardiovascular status: stable Anesthetic complications: no       Last Vitals:  Vitals:   09/13/2016 1715 08/25/2016 1730  BP:    Pulse:  75  Resp: (!) 25 (!) 25  Temp: 37.5 C 37.7 C    Last Pain:  Vitals:   08/21/2016 0400  TempSrc: Core (Comment)  PainSc:                  Nainika Newlun S

## 2016-09-03 NOTE — Progress Notes (Signed)
Paged MD Brabham regarding troponin of 0.12, CI 1.5, BP soft with systolic in the 80's, and regarding blood leaking around the wound vac on the abdomen. No orders received at this time. Will continue to monitor patient and make sure that blood does not break the seal on the wound vac. I will reach out to CCM regarding CI and BP.  Horton ChinMacKayla A Jahaad Penado, RN

## 2016-09-03 NOTE — Progress Notes (Signed)
GI at bedside for EGD; pt tolerating well; will continue to monitor

## 2016-09-03 NOTE — Progress Notes (Signed)
Pt with SBP in the 70's Neo gtt increased from 60 to 200mcg; 300mL blood out of wound vac over 1 hr; Dr. Myra GianottiBrabham notified and to bedside.  Orders received for 2 units PRBC.  Family updated our in waiting room.  Aline AugustNicole Mildred Tuccillo RN, CCRN

## 2016-09-03 NOTE — Progress Notes (Signed)
Paged MD Sommers for results of ABG initially and after 30 minutes. Orders written for RT. Will continue to monitor patient.  Horton ChinMacKayla A Jemery Stacey, RN

## 2016-09-03 NOTE — Op Note (Signed)
    Patient name: Michael Heath MRN: [id number] DOB: [birth year] Sex: male  08/24/2016 - 08/23/2016 Pre-operative Diagnosis: open abdomen Post-operative diagnosis:  Same Surgeon:  Durene CalBrabham, Wells Assistants:  Karsten RoKim Trinh Procedure:   #1:  Re-opening of recent laparotomy ( removal of abdominal wound vac)   #2:  Abdominal exploration   #3:  Abdominal wall closure Anesthesia:  General Blood Loss:  See anesthesia record Specimens:  none  Findings:   No intra-abdominal bleeding.  Cauterization of pulsatile small subcutaneous vessel.  Abdomen was closed with interrupted 0 Prolene.  Tension free closure  Indications:  71 year old s/p EVAR for ruptured AAA yesterday.  He developed abdominal compartment syndrome at the completion of the case which required abdominal exploration.  A wound vac was used for abdominal closure.  He has been relatively stable throughout the day, however, I did have to place a second suction port to his vac closure because of the drainage.  Throughout the day today, he has started back on blood pressure support and has had increased drainage out of his wound vac.  I felt he needed to go back to the OR for exploration  Procedure:  The patient was [facility] in [facility]  The patient was then placed supine on the table. general anesthesia was administered.  The patient was prepped and draped in the usual sterile fashion.  A time out was called and antibiotics were administered.  The wound vac was removed.  The abomen was explored.  No active bleeding was identified.  Residual clot was removed.  I examined the bowel and it was non-distended and pink.  I did not explore the retroperitoneum.  I did not see any evidence of expansion of the old hematoma, and there was not any drainage from the area of rupture.  I confirmed the NG the was in the stomach.  I began closing the abdomen with interrupted 0 Prolene figure of eight sutures and quickly realized that I was going to  be able to close the abdomen without any tension.  Once the abdomen was closed, the subcutaneous tissue was closed with 2-0 vicryl and the skin was closed with 4-0 vicryl and dermabond.  He was returned to the ICU in guarded condition   Disposition:  To PACU in stable condition.   Juleen ChinaV. Wells Brabham, M.D. [facility] Office: [phone] Pager:  [phone]

## 2016-09-03 NOTE — Anesthesia Preprocedure Evaluation (Signed)
Anesthesia Evaluation  Patient identified by MRN, date of birth, ID band Patient unresponsive  General Assessment Comment:Ruptured AAA repaired yesterday Preop documentation limited or incomplete due to emergent nature of procedure.  Airway Mallampati: II  TM Distance: >3 FB Neck ROM: Full    Dental no notable dental hx.    Pulmonary neg pulmonary ROS, Current Smoker,    Pulmonary exam normal breath sounds clear to auscultation       Cardiovascular + Peripheral Vascular Disease  Normal cardiovascular exam Rhythm:Regular Rate:Normal     Neuro/Psych TIAnegative psych ROS   GI/Hepatic negative GI ROS, Neg liver ROS,   Endo/Other  negative endocrine ROS  Renal/GU negative Renal ROS  negative genitourinary   Musculoskeletal negative musculoskeletal ROS (+)   Abdominal   Peds negative pediatric ROS (+)  Hematology negative hematology ROS (+)   Anesthesia Other Findings   Reproductive/Obstetrics negative OB ROS                             Anesthesia Physical Anesthesia Plan  ASA: IV and emergent  Anesthesia Plan: General   Post-op Pain Management:    Induction: Inhalational  Airway Management Planned: Oral ETT  Additional Equipment: Arterial line, CVP and PA Cath  Intra-op Plan:   Post-operative Plan: Post-operative intubation/ventilation  Informed Consent: I have reviewed the patients History and Physical, chart, labs and discussed the procedure including the risks, benefits and alternatives for the proposed anesthesia with the patient or authorized representative who has indicated his/her understanding and acceptance.   Dental advisory given  Plan Discussed with: CRNA and Surgeon  Anesthesia Plan Comments:         Anesthesia Quick Evaluation

## 2016-09-03 NOTE — Progress Notes (Signed)
EGD performed for NG/OG placement. Patient is on a ventilator and sedated on drips titrated by ICU RN. Joni Reiningicole was present for the entire procedure to assist with keeping patient comfortable and safe for the case. Patient tolerated procedure very well.

## 2016-09-03 NOTE — Progress Notes (Addendum)
[facility]  Subjective  - Patient intubated sedated.  Wound van intact with growing hematoma distally.     Objective 99/62 74 98.6 F (37 C) (!) 26 93%  Intake/Output Summary (Last 24 hours) at 08/31/2016 0753 Last data filed at 08/30/2016 0631  Gross per 24 hour  Intake          13605.6 ml  Output             4910 ml  Net           8695.6 ml    Wound vac evacuated at bedside under clean / steri conditions.  Second vac placed at distal portion of vac seal for overflow drainage to maintain seal. Pedal doppler signal Stable BP on neo Plan to try and place NG tube for abdominal decompression as soon as he is stable enough, Dr. Myra GianottiBrabham will discuss with IR Stable UO 1,300 since surgery HGB stable 11.2 Cr 1.94   S/P endovascular repair of ruptured AAA and ex-lap for abdominal compartmetn syndrome     Thomasena EdisCOLLINS, [facility]EM[facility]UREEN 08/24/2016 7:53 AM --  Laboratory Lab Results:  Recent Labs  09/17/2016 2338 09/12/2016 0345  WBC 11.7* 10.4  HGB 12.0* 11.2*  HCT 34.8* 33.1*  PLT 106* 106*   BMET  Recent Labs  08/31/2016 2007 09/08/2016 0345  NA 147* 146*  K 3.8 3.3*  CL 109 107  CO2 22 29  GLUCOSE 194* 157*  BUN 16 19  CREATININE 2.04* 1.94*  CALCIUM 7.2* 6.9*    COAG Lab Results  Component Value Date   INR 1.45 09/06/2016   INR 1.81 08/23/2016   INR 1.14 08/24/2016   No results found for: PTT  CV:  Requiring Neo for decreased BP and decreased cardiac index.  Will continue to monitor.  First troponin elevated.  Possibly demand ischemia but will ask cardiology to evaluate Pulm:  Needs to remain intubated and completely sedated until abdomen closed GI:  Can not place NG tube.  Patient reports a lot of choking and possible esophageal stenosis.  I spoke with GI to place a NG tube for gastric decompression.  He can not be transported to IR. Will need abdominal closure eventually.  Tentatively plan for wound vac change on Friday.  Keep  NPO ZO:XWRUEAVWUJGU:Creatinine decreasing and good uop.  Will continue to monitor Prophylaxis: will  starting lovenox  And protonix ABL:  Hb remains stable, continue with serial labs   WElls Anitha Kreiser

## 2016-09-04 ENCOUNTER — Encounter (HOSPITAL_COMMUNITY): Payer: Self-pay | Admitting: Surgery

## 2016-09-04 ENCOUNTER — Inpatient Hospital Stay (HOSPITAL_COMMUNITY): Payer: Medicare Other

## 2016-09-04 DIAGNOSIS — Q8789 Other specified congenital malformation syndromes, not elsewhere classified: Secondary | ICD-10-CM

## 2016-09-04 DIAGNOSIS — R0600 Dyspnea, unspecified: Secondary | ICD-10-CM

## 2016-09-04 DIAGNOSIS — R578 Other shock: Secondary | ICD-10-CM

## 2016-09-04 DIAGNOSIS — G9340 Encephalopathy, unspecified: Secondary | ICD-10-CM

## 2016-09-04 LAB — LACTIC ACID, PLASMA
LACTIC ACID, VENOUS: 1.4 mmol/L (ref 0.5–1.9)
Lactic Acid, Venous: 1.4 mmol/L (ref 0.5–1.9)

## 2016-09-04 LAB — PREPARE PLATELET PHERESIS
UNIT DIVISION: 0
Unit division: 0

## 2016-09-04 LAB — PREPARE FRESH FROZEN PLASMA
UNIT DIVISION: 0
UNIT DIVISION: 0
UNIT DIVISION: 0
UNIT DIVISION: 0
UNIT DIVISION: 0
UNIT DIVISION: 0
UNIT DIVISION: 0
UNIT DIVISION: 0
UNIT DIVISION: 0
UNIT DIVISION: 0
Unit division: 0
Unit division: 0
Unit division: 0
Unit division: 0
Unit division: 0
Unit division: 0
Unit division: 0
Unit division: 0
Unit division: 0
Unit division: 0

## 2016-09-04 LAB — HEMOGLOBIN AND HEMATOCRIT, BLOOD
HCT: 29.9 % — ABNORMAL LOW (ref 39.0–52.0)
HCT: 29.4 % — ABNORMAL LOW (ref 39.0–52.0)
HEMATOCRIT: 30.5 % — AB (ref 39.0–52.0)
HEMATOCRIT: 31.7 % — AB (ref 39.0–52.0)
Hemoglobin: 10.5 g/dL — ABNORMAL LOW (ref 13.0–17.0)
Hemoglobin: 10 g/dL — ABNORMAL LOW (ref 13.0–17.0)
Hemoglobin: 9.9 g/dL — ABNORMAL LOW (ref 13.0–17.0)
Hemoglobin: 9.5 g/dL — ABNORMAL LOW (ref 13.0–17.0)

## 2016-09-04 LAB — POCT I-STAT 3, ART BLOOD GAS (G3+)
ACID-BASE EXCESS: 7 mmol/L — AB (ref 0.0–2.0)
Acid-Base Excess: 5 mmol/L — ABNORMAL HIGH (ref 0.0–2.0)
BICARBONATE: 30.6 mmol/L — AB (ref 20.0–28.0)
Bicarbonate: 31 mmol/L — ABNORMAL HIGH (ref 20.0–28.0)
O2 SAT: 90 %
O2 Saturation: 98 %
PCO2 ART: 47.4 mmHg (ref 32.0–48.0)
PH ART: 7.478 — AB (ref 7.350–7.450)
PH ART: 7.418 (ref 7.350–7.450)
PO2 ART: 59 mmHg — AB (ref 83.0–108.0)
TCO2: 32 mmol/L (ref 0–100)
TCO2: 32 mmol/L (ref 0–100)
pCO2 arterial: 42.1 mmHg (ref 32.0–48.0)
pO2, Arterial: 94 mmHg (ref 83.0–108.0)

## 2016-09-04 LAB — BASIC METABOLIC PANEL
ANION GAP: 6 (ref 5–15)
BUN: 22 mg/dL — ABNORMAL HIGH (ref 6–20)
CHLORIDE: 111 mmol/L (ref 101–111)
CO2: 27 mmol/L (ref 22–32)
Calcium: 6.7 mg/dL — ABNORMAL LOW (ref 8.9–10.3)
Creatinine, Ser: 1.78 mg/dL — ABNORMAL HIGH (ref 0.61–1.24)
GFR calc non Af Amer: 37 mL/min — ABNORMAL LOW (ref >60)
GFR, EST AFRICAN AMERICAN: 43 mL/min — AB (ref >60)
Glucose, Bld: 130 mg/dL — ABNORMAL HIGH (ref 65–99)
POTASSIUM: 4 mmol/L (ref 3.5–5.1)
SODIUM: 144 mmol/L (ref 135–145)

## 2016-09-04 LAB — COMPREHENSIVE METABOLIC PANEL
ALBUMIN: 2 g/dL — AB (ref 3.5–5.0)
ALT: 18 U/L (ref 17–63)
AST: 29 U/L (ref 15–41)
Alkaline Phosphatase: 38 U/L (ref 38–126)
Anion gap: 8 (ref 5–15)
BUN: 22 mg/dL — AB (ref 6–20)
CHLORIDE: 107 mmol/L (ref 101–111)
CO2: 27 mmol/L (ref 22–32)
CREATININE: 1.7 mg/dL — AB (ref 0.61–1.24)
Calcium: 7 mg/dL — ABNORMAL LOW (ref 8.9–10.3)
GFR calc Af Amer: 45 mL/min — ABNORMAL LOW (ref >60)
GFR calc non Af Amer: 39 mL/min — ABNORMAL LOW (ref >60)
GLUCOSE: 111 mg/dL — AB (ref 65–99)
POTASSIUM: 4 mmol/L (ref 3.5–5.1)
Sodium: 142 mmol/L (ref 135–145)
Total Bilirubin: 3.7 mg/dL — ABNORMAL HIGH (ref 0.3–1.2)
Total Protein: 4.9 g/dL — ABNORMAL LOW (ref 6.5–8.1)

## 2016-09-04 LAB — GLUCOSE, CAPILLARY
Glucose-Capillary: 112 mg/dL — ABNORMAL HIGH (ref 65–99)
Glucose-Capillary: 114 mg/dL — ABNORMAL HIGH (ref 65–99)
Glucose-Capillary: 119 mg/dL — ABNORMAL HIGH (ref 65–99)
Glucose-Capillary: 126 mg/dL — ABNORMAL HIGH (ref 65–99)
Glucose-Capillary: 85 mg/dL (ref 65–99)
Glucose-Capillary: 98 mg/dL (ref 65–99)

## 2016-09-04 LAB — URINALYSIS, ROUTINE W REFLEX MICROSCOPIC
Glucose, UA: NEGATIVE mg/dL
Ketones, ur: NEGATIVE mg/dL
LEUKOCYTES UA: NEGATIVE
Nitrite: NEGATIVE
PH: 7 (ref 5.0–8.0)
Protein, ur: 100 mg/dL — AB
Specific Gravity, Urine: 1.026 (ref 1.005–1.030)

## 2016-09-04 LAB — CBC
HCT: 30 % — ABNORMAL LOW (ref 39.0–52.0)
HCT: 32 % — ABNORMAL LOW (ref 39.0–52.0)
Hemoglobin: 10.1 g/dL — ABNORMAL LOW (ref 13.0–17.0)
Hemoglobin: 10.4 g/dL — ABNORMAL LOW (ref 13.0–17.0)
MCH: 29.4 pg (ref 26.0–34.0)
MCH: 29.1 pg (ref 26.0–34.0)
MCHC: 33.7 g/dL (ref 30.0–36.0)
MCHC: 32.5 g/dL (ref 30.0–36.0)
MCV: 87.5 fL (ref 78.0–100.0)
MCV: 89.4 fL (ref 78.0–100.0)
PLATELETS: 136 10*3/uL — AB (ref 150–400)
PLATELETS: 145 10*3/uL — AB (ref 150–400)
RBC: 3.43 MIL/uL — ABNORMAL LOW (ref 4.22–5.81)
RBC: 3.58 MIL/uL — ABNORMAL LOW (ref 4.22–5.81)
RDW: 16.6 % — AB (ref 11.5–15.5)
RDW: 16.8 % — AB (ref 11.5–15.5)
WBC: 20.6 10*3/uL — AB (ref 4.0–10.5)
WBC: 22.3 10*3/uL — AB (ref 4.0–10.5)

## 2016-09-04 LAB — ECHOCARDIOGRAM COMPLETE
HEIGHTINCHES: 73 in
WEIGHTICAEL: 3552 [oz_av]

## 2016-09-04 LAB — APTT: APTT: 28 s (ref 24–36)

## 2016-09-04 LAB — HEPATIC FUNCTION PANEL
ALBUMIN: 2.1 g/dL — AB (ref 3.5–5.0)
ALT: 17 U/L (ref 17–63)
AST: 28 U/L (ref 15–41)
Alkaline Phosphatase: 32 U/L — ABNORMAL LOW (ref 38–126)
BILIRUBIN DIRECT: 1 mg/dL — AB (ref 0.1–0.5)
Indirect Bilirubin: 0.9 mg/dL (ref 0.3–0.9)
TOTAL PROTEIN: 4.1 g/dL — AB (ref 6.5–8.1)
Total Bilirubin: 1.9 mg/dL — ABNORMAL HIGH (ref 0.3–1.2)

## 2016-09-04 LAB — MAGNESIUM: MAGNESIUM: 1.5 mg/dL — AB (ref 1.7–2.4)

## 2016-09-04 LAB — PROTIME-INR
INR: 1.32
Prothrombin Time: 16.5 seconds — ABNORMAL HIGH (ref 11.4–15.2)

## 2016-09-04 LAB — PHOSPHORUS: Phosphorus: 2.9 mg/dL (ref 2.5–4.6)

## 2016-09-04 MED ORDER — MAGNESIUM SULFATE 50 % IJ SOLN
3.0000 g | Freq: Once | INTRAVENOUS | Status: AC
  Administered 2016-09-04: 3 g via INTRAVENOUS
  Filled 2016-09-04: qty 6

## 2016-09-04 MED ORDER — PIPERACILLIN-TAZOBACTAM 3.375 G IVPB
3.3750 g | Freq: Three times a day (TID) | INTRAVENOUS | Status: DC
  Administered 2016-09-04 – 2016-09-11 (×20): 3.375 g via INTRAVENOUS
  Filled 2016-09-04 (×22): qty 50

## 2016-09-04 MED ORDER — VANCOMYCIN HCL IN DEXTROSE 750-5 MG/150ML-% IV SOLN
750.0000 mg | Freq: Two times a day (BID) | INTRAVENOUS | Status: DC
  Administered 2016-09-04 – 2016-09-09 (×10): 750 mg via INTRAVENOUS
  Filled 2016-09-04 (×12): qty 150

## 2016-09-04 MED ORDER — ALBUTEROL SULFATE (2.5 MG/3ML) 0.083% IN NEBU
2.5000 mg | INHALATION_SOLUTION | RESPIRATORY_TRACT | Status: DC | PRN
  Administered 2016-09-22: 2.5 mg via RESPIRATORY_TRACT
  Filled 2016-09-04: qty 3

## 2016-09-04 MED ORDER — DEXMEDETOMIDINE HCL IN NACL 200 MCG/50ML IV SOLN
0.4000 ug/kg/h | INTRAVENOUS | Status: DC
  Filled 2016-09-04: qty 50

## 2016-09-04 MED ORDER — SODIUM CHLORIDE 0.9 % IV SOLN
INTRAVENOUS | Status: DC
  Administered 2016-09-04: 100 mL via INTRAVENOUS
  Administered 2016-09-05 (×2): via INTRAVENOUS

## 2016-09-04 MED FILL — Sodium Chloride IV Soln 0.9%: INTRAVENOUS | Qty: 1000 | Status: AC

## 2016-09-04 MED FILL — Heparin Sodium (Porcine) Inj 1000 Unit/ML: INTRAMUSCULAR | Qty: 30 | Status: AC

## 2016-09-04 MED FILL — Sodium Chloride Irrigation Soln 0.9%: Qty: 3000 | Status: AC

## 2016-09-04 NOTE — Progress Notes (Signed)
ABG results ph 7.41, co2 47.4, O2 59, bicarb 30.6, O2 sats 90% on PRVC 50% 22/10 [facility];

## 2016-09-04 NOTE — Progress Notes (Signed)
Pharmacy Antibiotic Note  Michael Heath is a 71 y.o. male admitted on 08/25/2016 with pneumonia.  Pharmacy has been consulted for Vancomycin / Zosyn dosing.  S/p repair of ruptured abdominal aortic aneurysm  Plan: Vancomycin 750 IV every 12 hours.  Goal trough 15-20 mcg/mL.  Zosyn 3.375 grams iv Q 8 hours - 4 hr infusion  Height: 6\' 1"  (185.4 cm) Weight: 222 lb (100.7 kg) IBW/kg (Calculated) : 79.9  Temp (24hrs), Avg:99.9 F (37.7 C), Min:98.8 F (37.1 C), Max:101.3 F (38.5 C)   Recent Labs Lab 09/04/2016 1509  May 29, 2017 0345 May 29, 2017 0842 May 29, 2017 1200 May 29, 2017 1630 May 29, 2017 2055 09/04/16 0200  WBC  --   < > 10.4  --  15.3* 14.8* 15.3* 20.6*  CREATININE  --   < > 1.94* 1.85* 1.91*  --  1.55* 1.78*  LATICACIDVEN 9.75*  --   --  1.3  --   --   --   --   < > = values in this interval not displayed.  Estimated Creatinine Clearance: 47.5 mL/min (by C-G formula based on SCr of 1.78 mg/dL (H)).    No Known Allergies   Thank you for allowing pharmacy to be a part of this patient's care. Okey RegalLisa Staci Carver, PharmD [phone]  09/04/2016 2:48 PM

## 2016-09-04 NOTE — Progress Notes (Signed)
PULMONARY / CRITICAL CARE MEDICINE   Name: Michael Heath MRN: [id number] DOB: [birth year]    ADMISSION DATE:  09/07/2016 CONSULTATION DATE:  09/16/2016  REFERRING MD:  Coral Else MD  CHIEF COMPLAINT:  Ruptured AAA aneurysm status post repair, acute hypoxic respiratory failure.  HISTORY OF PRESENT ILLNESS:   71 year old with past medical history of hypertension, hyperlipidemia, CKD, stage III. Admitted today with right flank, back pain. A CTA which showed ruptured AAA. He was transferred emergently to [facility] for stent repair. In the OR he received 16 units PRBC, 10 units FFP, 3 units platelets, 1 unit cryo and 1 Cell Saver. He developed abdominal comparment syndrome in OR from massive RP bleed. He is brought back to the ICU with an open abdomen. PCCM consulted for vent, sedation management.   SUBJECTIVE:  Pt had EGD done for insertion of NGT. No stricture seen in esophagus.  Got 2 more u pRBC and plt prior to OR.   Went to OR last night to have the abdomen closed.  Requiring more Fio2. On higher neosynephrine.  Making urine.     VITAL SIGNS: BP 111/63   Pulse (!) 59   Temp 99.1 F (37.3 C)   Resp (!) 22   Ht 6\' 1"  (1.854 m)   Wt 100.7 kg (222 lb)   SpO2 99%   BMI 29.29 kg/m   HEMODYNAMICS: PAP: (28-52)/(9-30) 49/11 CVP:  [9 mmHg-23 mmHg] 18 mmHg CO:  [4 L/min-7.3 L/min] 4.8 L/min CI:  [1.8 L/min/m2-3.2 L/min/m2] 2.2 L/min/m2  VENTILATOR SETTINGS: Vent Mode: PRVC FiO2 (%):  [50 %-80 %] 80 % Set Rate:  [22 bmp-25 bmp] 22 bmp Vt Set:  [480 mL-640 mL] 640 mL PEEP:  [8 cmH20-12 cmH20] 12 cmH20 Pressure Support:  [10 cmH20] 10 cmH20 Plateau Pressure:  [20 cmH20-26 cmH20] 26 cmH20  INTAKE / OUTPUT: I/O last 3 completed shifts: In: 7852.8 [I.V.:6424.8; Blood:1142; NG/GT:30; IV Piggyback:256] Out: 7180 [Urine:1930; Emesis/NG output:200; Drains:4200; Blood:850]  PHYSICAL EXAMINATION: General:  Sedated, intubated, NAD.  Neuro: No focal deficits. CN grossly intact HEENT:   No thyromegaly, JVD. ETT in place.  Cardiovascular:  Regular rate and rhythm, no murmurs rubs gallops Lungs: Good ae. Some crackles at bases. Occasional wheeze, (-) rhonchi Abdomen:  Closed abdomen. Diminished BS.  Musculoskeletal:  Normal tone and bulk Skin: Intact. Gr 2 edema. Some mottling seen in BLE. Warm extremities.   LABS:  BMET  Recent Labs Lab 08/21/2016 0842 09/17/2016 1200 09/07/2016 2055 09/04/16 0200  NA 147*  --  145 144  K 3.5  --  3.7 4.0  CL 110  --  110 111  CO2 29  --  28 27  BUN 21*  --  18 22*  CREATININE 1.85* 1.91* 1.55* 1.78*  GLUCOSE 114*  --  109* 130*    Electrolytes  Recent Labs Lab 08/25/2016 0842 08/23/2016 1630 09/01/2016 2055 09/04/16 0200  CALCIUM 6.7*  --  6.6* 6.7*  MG 1.9 1.6*  --  1.5*  PHOS 4.1 3.3  --  2.9    CBC  Recent Labs Lab 08/26/2016 1630 08/26/2016 2055 09/04/16 0039 09/04/16 0200  WBC 14.8* 15.3*  --  20.6*  HGB 11.2* 9.3* 10.5* 10.1*  HCT 33.6* 27.6* 31.7* 30.0*  PLT 107*  98* 120*  --  136*    Coag's  Recent Labs Lab 08/30/2016 1739 09/08/2016 2007 08/24/2016 0842 09/09/2016 1630 09/01/2016 2055  APTT 58* 43*  --  29  --   INR 1.81 1.45 1.39 1.22  1.41    Sepsis Markers  Recent Labs Lab 08/31/2016 1509 08/21/2016 0842  LATICACIDVEN 9.75* 1.3    ABG  Recent Labs Lab 08/24/2016 2146 08/22/2016 2339 09/04/16 0416  PHART 7.465* 7.385 7.478*  PCO2ART 41.2 49.4* 42.1  PO2ART 54.0* 81.0* 94.0    Liver Enzymes  Recent Labs Lab 08/21/2016 0345 09/17/2016 1630 09/04/16 0200  AST 45* 36 28  ALT 29 22 17   ALKPHOS 35* 33* 32*  BILITOT 1.6* 1.2 1.9*  ALBUMIN 2.6* 2.1* 2.1*    Cardiac Enzymes  Recent Labs Lab 09/13/2016 0842 09/08/2016 1200 08/26/2016 2055  TROPONINI 0.17* 0.16* 0.11*    Glucose  Recent Labs Lab 09/09/2016 0850 08/22/2016 1214 09/07/2016 1657 09/14/2016 2046 09/13/2016 2331 09/04/16 0355  GLUCAP 82 121* 101* 103* 103* 126*    Imaging Dg Chest Port 1 View  Result Date: 09/04/2016 CLINICAL  DATA:  Respiratory failure. Abdominal aortic aneurysm repair. EXAM: PORTABLE CHEST 1 VIEW COMPARISON:  09/04/2016. FINDINGS: Unchanged cardiomediastinal silhouette. ETT roughly 6 cm above carina. Swan-Ganz catheter poorly visualized, overlying the spine, probably lies in the RIGHT atrium or RIGHT ventricular outflow tract and should be advanced or withdrawn. Orogastric tube tip not visualized. LEFT pulmonary opacity, likely atelectasis, effusion, with possible infiltrate, stable. IMPRESSION: Stable aeration. Swan-Ganz catheter tip not clearly visualized, but probably too low. Correlate with waveforms for advancement or withdrawal. Electronically Signed   By: Elsie StainJohn T Curnes M.D.   On: 09/04/2016 07:39   Dg Chest Port 1 View  Result Date: 08/28/2016 CLINICAL DATA:  Endotracheal tube placements EXAM: PORTABLE CHEST 1 VIEW COMPARISON:  Chest radiograph 09/14/2016 at 6 o\'clock a.m. FINDINGS: Endotracheal tube tip is in unchanged position at the level of the clavicular heads. Nasogastric tube is skull below the diaphragm. The right IJ approach pulmonary arterial catheter has been retracted and the tip is now likely at the right ventricular outflow tract. Bibasilar opacities are unchanged. Small left pleural effusion. IMPRESSION: 1. Unchanged position endotracheal tube, in radiographically appropriate position. 2. Retraction of pulmonary arterial catheter with tip now likely at the right ventricular outflow tract. 3. Unchanged bibasilar opacities. Electronically Signed   By: Deatra RobinsonKevin  Herman M.D.   On: 09/07/2016 21:25     STUDIES:  CT Angio chest, abdomen 09/07/2016- ruptured 7.8 cm abdominal aortic aneurysm with a large RP hematoma. Moderate emphysema. No focal pneumonia, consolidation, opacities. CXR 09/01/16- COPD, no active cardio pulmonary disease All images personally reviewed.  CULTURES: MRSA 2/14 > (-) Sputum 2/14 > Blood 2/14 >   ANTIBIOTICS: Cefuroxime 2/13> 2/14 Periop   SIGNIFICANT  EVENTS: 2/13- Admit, OR for ruptured AAA. 2/14- Back to OR for abdomina closure.    LINES/TUBES: ETT 2/13 Rt IJ cordis 2/13   DISCUSSION: Patient admitted  for acute AAA rupture and had endovascular repair of ruptured AAA and ex-lap for abdominal compartment syndrome. Abdomen was kept open after procedure. Kept intubated post op. Patient with hypoxemia after receiveing significant amounts of blood products. Pt went back to OR on 2/14 for abdominal closure.    Acute Hypoxemic Respiratory Failure/ARDS 2/2  TRALI with significant amount of blood products transfusion + Pulm edema with volume overload + Demand ischemia + Possible HCAP LLL - Pt with fever last 24 hrs.  Will send for blood culture. MRSA is (-) and trache asp is pending. CXR on 2/15 with possible infiltrate over at LLL. - If he worsens clinically and requires more O2 >> will plan on starting Zosyn and Vanc - observe fever for now off  abx - Continue ARDS protocol. On 70% FiO2 and PEEP 10. Keep o2 sats > 88%. Try to cut down Fio2.  - Continue with sedation (versed and fentanyl drips). Start tapering off.  - He has volume overload.  Will d/c IVF. May need diuresis later on (once off pressors)   Abdominal compartment syndrome. S/P ExLap 2/13 and S/P Abs closure 2/14 -Per Surgery -NGT inserted by GI 2/2 h/o esophageal stricture.  Per GI, no stricture seen. Pt just has a large tongue in the way.    S/P Endovascular Repair of Ruptured AAA 2/13 with Closure of abdomen on 2/14 - on neosynephrine >> attempt to wean off - cont Hb and hct q6. Transfuse for Hb < 7 - no acitve bleeding right now 9still on pressors though)  AKI - observe urine output. Making adequate urine - will d/c IVF as he is volume overloaded.  - will need diuresis once off pressors.   Best Practice - on SCD - on PPI for SUP    I spent  32  minutes of Critical Care time with this patient today.  No family at bedside.   Pollie Meyer,  MD 09/04/2016, 8:02 AM [facility] Pager [phone] After 3 pm or if no answer, call [phone]

## 2016-09-04 NOTE — Progress Notes (Signed)
   [facility] PCCM  Pt's fever is persistent, 101F.  Coming down on pressors.  Still intubated.   Plan : 1. Cultures have been sent.  2. Start vanc and zosyn 3. Try switching versed drip to precedex to facilitate weaning.  If he is "too agitated" on precedex drip, will resume versed pushes, possibly drip.   Pollie MeyerJ. Angelo A de Dios, MD 09/04/2016, 2:29 PM [facility] Pager [phone] After 3 pm or if no answer, call [phone]

## 2016-09-04 NOTE — Progress Notes (Signed)
Pt began to become labile with BP; increased neo gtt to 90mcg from 60mcg; feet and legs becoming mottled and scrotum deep purple; Dr. Randie Heinzain notified and orders received for labs; will continue to monitor.

## 2016-09-04 NOTE — Progress Notes (Signed)
eLink Physician-Brief Progress Note Patient Name: Amil AmenDavid T Freimuth DOB: 1946/03/13 MRN: [id number]   Date of Service  09/04/2016  HPI/Events of Note  hypomagnesemia  eICU Interventions  repleted     Intervention Category Intermediate Interventions: Electrolyte abnormality - evaluation and management  Max FickleDouglas McQuaid 09/04/2016, 2:46 AM

## 2016-09-04 NOTE — Progress Notes (Signed)
Initial Nutrition Assessment  DOCUMENTATION CODES:   Not applicable  INTERVENTION:    Rec nutrition support initiation in next 24 hours (EN vs TPN)  NUTRITION DIAGNOSIS:   Inadequate oral intake related to inability to eat as evidenced by NPO status  GOAL:   Patient will meet greater than or equal to 90% of their needs  MONITOR:   Vent status, Labs, Weight trends, Skin, I & O's  REASON FOR ASSESSMENT:   Ventilator  ASSESSMENT:   [name] wiht PMH of HTN, HLD, CKD, stage III. Admitted with right flank, back pain. A CTA which showed ruptured AAA. [facility].    Pt s/p procedures 2/13: REPAIR OF RUPTURED ABDOMINAL AORTIC ANEURYSM EVACUATION OF RETROPERITONEAL HEMATOMA PLACEMENT OF ABDOMINAL WOUND VAC  Patient is currently intubated on ventilator support MV: 13.1 L/min Temp (24hrs), Avg:99.8 F (37.7 C), Min:98.8 F (37.1 C), Max:101.3 F (38.5 C)  Pt with acute hypoxic respiratory failure >> on ARDS Protocol. Developed abdominal comparment syndrome in OR from massive RP bleed. Brought back to SICU with open abdomen. Labs and medications reviewed. CBG's [phone].   Nutrition focused physical exam completed.  No muscle or subcutaneous fat depletion noticed.  Diet Order:  Diet NPO time specified  Skin:  Wound (see comment) (abdominal wound VAC)  Last BM:  N/A  Height:   Ht Readings from Last 1 Encounters:  08/27/2016 6\' 1"  (1.854 m)    Weight:   Wt Readings from Last 1 Encounters:  09/10/2016 222 lb (100.7 kg)    Ideal Body Weight:  83.6 kg  BMI:  Body mass index is 29.29 kg/m.  Estimated Nutritional Needs:   Kcal:  2362  Protein:  150-160 gm  Fluid:  per MD  EDUCATION NEEDS:   No education needs identified at this time  Michael Heath, RD, LDN Pager #: [phone] After-Hours Pager #: [phone]

## 2016-09-04 NOTE — Progress Notes (Signed)
RT note: RT attempted maximum amount of attempts to place arterial line ordered for patient however all attempts unsuccessful.  MD is aware.  Will continue to monitor.

## 2016-09-04 NOTE — Progress Notes (Signed)
Sommer MD paged regarding patient's need for an A Line. Ground team text-paged. Will continue to monitor.    Marlou PorchBradley Selassie Spatafore

## 2016-09-04 NOTE — Progress Notes (Signed)
    Subjective  - POD #2, s/p EVAR and ex-lap for ruptured   POD#1, s/p abd washout and closure  Remains intubated Opens eyes to voice   Physical Exam:  Intubated and sedated abd soft Ext warm with no mottling       Assessment/Plan:  POD #2/1  CV:  still Requiring Neo, but able to wean.  Will continue to monitor.   Troponin elevated.  Possibly demand ischemia. Cardiology on board.  ECHO pending to assess function Pulm:  recommend weaning vent and trying to extubate GI:  OG placed by GI yesterday.  No esophageal stenosis.  Keep NPO.  Consider TF once bowel function returns.  Abd wound now closed ZO:[id number]:Creatinine decreasing and good uop.  Will continue to monitor UOP which has trended down recently.  Keep foley in for UOP IVF:  BP has decreased as well as UOP with stopping IVF.  Will restart at 100cc/hr Prophylaxis: will  starting lovenox  And protonix ABL:  Hb remains stable, continue with serial labs   Brabham, Wells 09/04/2016 2:08 PM --  Vitals:   09/04/16 1330 09/04/16 1345  BP:    Pulse: 94 88  Resp: (!) 22 (!) 22  Temp: (!) 101.1 F (38.4 C) (!) 101.1 F (38.4 C)    Intake/Output Summary (Last 24 hours) at 09/04/16 1408 Last data filed at 09/04/16 1400  Gross per 24 hour  Intake          5358.18 ml  Output             1800 ml  Net          3558.18 ml     Laboratory CBC    Component Value Date/Time   WBC 20.6 (H) 09/04/2016 0200   HGB 10.0 (L) 09/04/2016 0955   HCT 30.5 (L) 09/04/2016 0955   HCT 47.6 05/01/2016 1001   PLT 136 (L) 09/04/2016 0200   PLT 230 05/01/2016 1001    BMET    Component Value Date/Time   NA 144 09/04/2016 0200   NA 134 08/25/2016 1335   K 4.0 09/04/2016 0200   CL 111 09/04/2016 0200   CO2 27 09/04/2016 0200   GLUCOSE 130 (H) 09/04/2016 0200   BUN 22 (H) 09/04/2016 0200   BUN 26 08/25/2016 1335   CREATININE 1.78 (H) 09/04/2016 0200   CALCIUM 6.7 (L) 09/04/2016 0200   GFRNONAA 37 (L) 09/04/2016 0200   GFRAA 43 (L)  09/04/2016 0200    COAG Lab Results  Component Value Date   INR 1.41 07/14/17   INR 1.22 07/14/17   INR 1.39 07/14/17   No results found for: PTT  Antibiotics Anti-infectives    Start     Dose/Rate Route Frequency Ordered Stop   2016-08-18 1900  cefUROXime (ZINACEF) 1.5 g in dextrose 5 % 50 mL IVPB     1.5 g 100 mL/hr over 30 Minutes Intravenous  Once 2016-08-18 1849     09/12/2016 2015  cefUROXime (ZINACEF) 1.5 g in dextrose 5 % 50 mL IVPB     1.5 g 100 mL/hr over 30 Minutes Intravenous Every 12 hours 08/23/2016 2006 2016-08-18 1013       V. Charlena CrossWells Brabham IV, M.D. [facility] [place] Office: [phone] Pager:  [phone]

## 2016-09-04 NOTE — Progress Notes (Signed)
Progress Note  Patient Name: Michael Heath Date of Encounter: 09/04/2016  Primary Cardiologist: [place] [facility]. [place]  Subjective    Presented with ruptured 7.8 cm  abdominal aortic aneurysm.  Remains intubated. Went back to e oral last  P M.  Inpatient Medications    Scheduled Meds: . cefUROXime (ZINACEF)  IV  1.5 g Intravenous Once  . chlorhexidine gluconate (MEDLINE KIT)  15 mL Mouth Rinse BID  . docusate sodium  100 mg Oral Daily  . enoxaparin (LOVENOX) injection  40 mg Subcutaneous Q24H  . fentaNYL (SUBLIMAZE) injection  50 mcg Intravenous Once  . insulin aspart  1-3 Units Subcutaneous Q4H  . mouth rinse  15 mL Mouth Rinse QID  . pantoprazole (PROTONIX) IV  40 mg Intravenous Q12H   Continuous Infusions: . dextrose 5 % and 0.45 % NaCl with KCl 20 mEq/L 75 mL/hr at 09/04/16 0600  . fentaNYL infusion INTRAVENOUS 350 mcg/hr (09/04/16 0600)  . midazolam (VERSED) infusion 6 mg/hr (09/04/16 0600)  . phenylephrine (NEO-SYNEPHRINE) Adult infusion 165 mcg/min (09/04/16 0650)   PRN Meds: sodium chloride, acetaminophen **OR** acetaminophen, artificial tears, bisacodyl, fentaNYL, fentaNYL (SUBLIMAZE) injection, fentaNYL (SUBLIMAZE) injection, hydrALAZINE, HYDROmorphone (DILAUDID) injection, labetalol, metoprolol, midazolam, ondansetron, phenol, potassium chloride   Vital Signs    Vitals:   09/04/16 0600 09/04/16 0615 09/04/16 0630 09/04/16 0645  BP: 114/72  111/63   Pulse: 60 (!) 59 (!) 59 (!) 59  Resp: (!) 22 (!) 22 (!) 22 (!) 22  Temp: 99.1 F (37.3 C) 99.1 F (37.3 C) 99.1 F (37.3 C) 99.1 F (37.3 C)  TempSrc: Core (Comment)     SpO2: 100% 100% 98% 99%  Weight:      Height:        Intake/Output Summary (Last 24 hours) at 09/04/16 0809 Last data filed at 09/04/16 0600  Gross per 24 hour  Intake          5891.18 ml  Output             2410 ml  Net          3481.18 ml   Filed Weights   08/29/2016 1241  Weight: 222 lb (100.7 kg)    Telemetry   Normal sinus rhythm  without significant ventricular ectopy. Telemetry personally reviewed.  ECG    ECG from yesterday reveals normal sinus rhythm without acute ST-T wave change - Personally Reviewed  Physical Exam  Grossly volume overloaded with arm, abdominal wall, and leg edema. GEN: No acute distress.   Neck: No JVD Cardiac: RRR, without rub or gallops. Marked edema globally secondary to volume loading Respiratory:  no obvious wheezing. Diminished breath sounds.Marland Kitchen GI: Soft, nontender, non-distended  MS: No deformity. Neuro:   sedated and paralyzed Psych: Intubated and sedated  Labs    Chemistry Recent Labs Lab 09/12/2016 0345 09/11/2016 0842 09/15/2016 1200 09/16/2016 1630 09/05/2016 2055 09/04/16 0200  NA 146* 147*  --   --  145 144  K 3.3* 3.5  --   --  3.7 4.0  CL 107 110  --   --  110 111  CO2 29 29  --   --  28 27  GLUCOSE 157* 114*  --   --  109* 130*  BUN 19 21*  --   --  18 22*  CREATININE 1.94* 1.85* 1.91*  --  1.55* 1.78*  CALCIUM 6.9* 6.7*  --   --  6.6* 6.7*  PROT 4.6*  --   --  4.1*  --  4.1*  ALBUMIN 2.6*  --   --  2.1*  --  2.1*  AST 45*  --   --  36  --  28  ALT 29  --   --  22  --  17  ALKPHOS 35*  --   --  33*  --  32*  BILITOT 1.6*  --   --  1.2  --  1.9*  GFRNONAA 33* 35* 34*  --  43* 37*  GFRAA 38* 41* 39*  --  50* 43*  ANIONGAP 10 8  --   --  7 6     Hematology Recent Labs Lab 09/01/2016 1630 09/14/2016 2055 09/04/16 0039 09/04/16 0200  WBC 14.8* 15.3*  --  20.6*  RBC 3.88* 3.15*  --  3.43*  HGB 11.2* 9.3* 10.5* 10.1*  HCT 33.6* 27.6* 31.7* 30.0*  MCV 86.6 87.6  --  87.5  MCH 28.9 29.5  --  29.4  MCHC 33.3 33.7  --  33.7  RDW 16.0* 16.4*  --  16.6*  PLT 107*  98* 120*  --  136*    Cardiac Enzymes Recent Labs Lab 09/13/2016 2230 09/08/2016 0842 09/13/2016 1200 09/06/2016 2055  TROPONINI 0.12* 0.17* 0.16* 0.11*    Recent Labs Lab 08/23/2016 1501  TROPIPOC 0.01     BNPNo results for input(s): BNP, PROBNP in the last 168 hours.   DDimer  Recent Labs Lab  08/23/2016 1739 09/14/2016 1630  DDIMER 3.11* >20.00*     Radiology    Dg Chest Port 1 View  Result Date: 09/04/2016 CLINICAL DATA:  Respiratory failure. Abdominal aortic aneurysm repair. EXAM: PORTABLE CHEST 1 VIEW COMPARISON:  08/21/2016. FINDINGS: Unchanged cardiomediastinal silhouette. ETT roughly 6 cm above carina. Swan-Ganz catheter poorly visualized, overlying the spine, probably lies in the RIGHT atrium or RIGHT ventricular outflow tract and should be advanced or withdrawn. Orogastric tube tip not visualized. LEFT pulmonary opacity, likely atelectasis, effusion, with possible infiltrate, stable. IMPRESSION: Stable aeration. Swan-Ganz catheter tip not clearly visualized, but probably too low. Correlate with waveforms for advancement or withdrawal. Electronically Signed   By: Staci Righter M.D.   On: 09/04/2016 07:39   Dg Chest Port 1 View  Result Date: 08/30/2016 CLINICAL DATA:  Endotracheal tube placements EXAM: PORTABLE CHEST 1 VIEW COMPARISON:  Chest radiograph 09/10/2016 at 6 o\'clock a.m. FINDINGS: Endotracheal tube tip is in unchanged position at the level of the clavicular heads. Nasogastric tube is skull below the diaphragm. The right IJ approach pulmonary arterial catheter has been retracted and the tip is now likely at the right ventricular outflow tract. Bibasilar opacities are unchanged. Small left pleural effusion. IMPRESSION: 1. Unchanged position endotracheal tube, in radiographically appropriate position. 2. Retraction of pulmonary arterial catheter with tip now likely at the right ventricular outflow tract. 3. Unchanged bibasilar opacities. Electronically Signed   By: Ulyses Jarred M.D.   On: 08/22/2016 21:25   Dg Chest Port 1 View  Result Date: 08/24/2016 CLINICAL DATA:  Endotracheal tube EXAM: PORTABLE CHEST 1 VIEW COMPARISON:  Yesterday FINDINGS: Endotracheal tube tip is at the clavicular heads. There is a Swan-Ganz catheter by right IJ approach which is coiled at the level  of the main pulmonary artery. Worsening aeration at the left base with poorly defined diaphragm. Generalized interstitial opacity. Chronic cardiopericardial enlargement. No pneumothorax. IMPRESSION: 1. Unchanged positioning of endotracheal and orogastric tubes as described. 2. Cardiomegaly and pulmonary edema. Worsening retrocardiac aeration. Electronically Signed   By: Monte Fantasia  M.D.   On: 09/11/2016 07:56   Dg Chest Port 1 View  Result Date: 08/26/2016 CLINICAL DATA:  Abdominal aortic endovascular stent grafting today. EXAM: PORTABLE CHEST 1 VIEW COMPARISON:  09/01/2016 CXR bold FINDINGS: Slightly low-lying endotracheal tube tip approximately 2.3 cm above the carina. Pullback approximately 1 cm. All Swan-Ganz catheter seen from right IJ approach with tip projecting over left pulmonary artery towards the left lower lobe. Mild interstitial edema with atelectasis at the left lung base. No pulmonary consolidation. No pneumothorax. Cardiomegaly with mild aortic atherosclerosis. IMPRESSION: 1. Slightly low-lying endotracheal tube tip positioned 2.3 cm above the carina. Pullback approximately 1 cm. 2. Mild interstitial edema with borderline cardiomegaly and aortic atherosclerosis. Electronically Signed   By: Ashley Royalty M.D.   On: 08/28/2016 22:37   Dg Abd Portable 1v  Result Date: 08/28/2016 CLINICAL DATA:  Status post placement of the aortoiliac stent graft today. Initial encounter. EXAM: PORTABLE ABDOMEN - 1 VIEW COMPARISON:  CTA of the chest, abdomen and pelvis performed earlier today at 3:15 p.m. FINDINGS: The visualized bowel gas pattern is unremarkable. Scattered air and stool filled loops of colon are seen; no abnormal dilatation of small bowel loops is seen to suggest small bowel obstruction. An aortoiliac stent graft is noted. The underlying calcified wall of the recently ruptured abdominal aortic aneurysm is partially characterized. The visualized osseous structures are within normal limits; the  sacroiliac joints are unremarkable in appearance. The visualized lung bases are essentially clear. IMPRESSION: 1. Aortoiliac stent graft is grossly unremarkable in appearance, though not well assessed on radiograph. 2.  Unremarkable bowel gas pattern. Electronically Signed   By: Garald Balding M.D.   On: 08/25/2016 22:56   Ct Angio Chest/abd/pel For Dissection W And/or Wo Contrast  Result Date: 08/23/2016 CLINICAL DATA:  Right flank pain, hypotension EXAM: CT ANGIOGRAPHY CHEST, ABDOMEN AND PELVIS TECHNIQUE: Multidetector CT imaging through the chest, abdomen and pelvis was performed using the standard protocol during bolus administration of intravenous contrast. Multiplanar reconstructed images and MIPs were obtained and reviewed to evaluate the vascular anatomy. CONTRAST:  100 cc Isovue 370 COMPARISON:  09/01/2016 chest x-ray FINDINGS: CTA CHEST FINDINGS Cardiovascular: Preferential opacification of the thoracic aorta. Atherosclerosis of the thoracic aorta without aneurysm or dissection. No mediastinal hemorrhage, hematoma, or intramural hematoma. Atherosclerosis of the major branch vessels remain patent. Three-vessel arch anatomy. Native coronary atherosclerosis noted. Normal heart size. No pericardial effusion. Mediastinum/Nodes: No adenopathy.  Small hiatal hernia evident. Lungs/Pleura: Background moderate emphysema pattern with apical blebs. No focal pneumonia, collapse or consolidation. Mild bibasilar nodular bronchovascular opacities, difficult to exclude mild bronchitis/bronchopneumonia. Trachea and central airways are patent. No pleural abnormality, effusion or pneumothorax. Musculoskeletal: Thoracic spondylosis evident. No acute osseous finding or fracture. Sternum intact. Review of the MIP images confirms the above findings. CTA ABDOMEN AND PELVIS FINDINGS VASCULAR Aorta: Infrarenal atherosclerotic abdominal aortic aneurysm measures 7 cm in AP dimension and 7.8 cm transverse. There is evidence of  acute abdominal aortic aneurysm rupture with irregularity and displacement of the wall calcifications on the right lateral margin of the aneurysm with contrast extravasation into the large right retroperitoneal hematoma. Retroperitoneal hematoma measures approximately 14.2 cm in AP dimension and 8.8 cm transverse, image 141. Celiac: Atherosclerosis and wall irregularity without occlusion. SMA: Minor atherosclerotic change without occlusion or stenosis. Renals: Single widely patent renal arteries with minor atherosclerosis. No accessory renal artery demonstrated. IMA: Not well demonstrated. Very diminutive in caliber. Difficult to assess for patency. Inflow: Atherosclerosis and tortuosity of the iliac vessels.  The common, internal and external iliac arteries remain patent. No inflow disease or occlusion. 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 Veins: Venous imaging not performed. Review of the MIP images confirms the above findings. NON-VASCULAR Hepatobiliary: In the posterior right hepatic dome, there is an enhancing hypervascular indeterminate hepatic mass measuring 2.2 x 2.1 cm, image 94. Recommend follow-up nonemergent evaluation with MRI. No biliary obstruction. Cholelithiasis evident. Pancreas: Unremarkable. No pancreatic ductal dilatation or surrounding inflammatory changes. Spleen: Choose Adrenals/Urinary Tract: Nonspecific enhancement of the adrenal glands related to arterial phase imaging. Symmetric renal enhancement. Hypodense renal cysts noted in the right upper pole measuring 2 cm and in the left lower pole measuring 15 mm. No renal obstruction or hydronephrosis. No hydroureter or ureteral calculus appreciated. Urinary bladder collapsed. Stomach/Bowel: Negative for bowel obstruction, significant dilatation, ileus, or free air. Normal appendix. Scattered colonic diverticulosis, most pronounced in the sigmoid. No acute inflammatory process. No fluid collection or abscess.  Lymphatic: No adenopathy. Reproductive: Symmetric seminal vesicles. Normal size prostate. No significant finding. Other: No hernia.  Intact abdominal wall. Musculoskeletal: Lumbar spondylosis and lower lumbar facet arthropathy. No acute compression fracture. No acute osseous finding. Degenerative changes of the pelvis and hips. Review of the MIP images confirms the above findings. 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 Moderate pulmonary emphysema Small hiatal hernia Incidental renal cysts Colonic diverticulosis Cholelithiasis **An incidental finding of potential clinical significance has been found. Indeterminate enhancing posterior right hepatic dome 2.2 cm mass by arterial phase imaging. No available comparison studies. This warrants follow-up nonemergent MRI evaluation. These results were called by telephone at the time of interpretation on 09/05/2016 at 3:51 pm to Dr. Brantley Stage , who verbally acknowledged these results.** Electronically Signed   By: Jerilynn Mages.  Shick M.D.   On: 09/14/2016 15:54    Cardiac Studies   None to this point. Echo is pending.  Patient Profile     71 y.o. male 71 year old with no prior vascular history presenting with ruptured abdominal aortic aneurysm. Status post aneurysm repair, required multiple transfusions and crystalloid for hemodynamic support. Has hypoxic respiratory failure, acute kidney injury, and noted on CT to have coronary calcification. Trivial elevation in troponin without peak and trough to suggest ACS.  Assessment & Plan    1. Elevated troponin, that to this point appears to be related to demand burden and recurring hypotension. No evidence for ACS at this point. Plan 2-D Doppler echocardiogram to rule out regional wall motion abnormality. 2. Previous asymptomatic coronary artery disease, confirmed by the presence of coronary calcification on CT. 3. Shock state secondary to ruptured  aneurysm and multiorgan dysfunction due to hypotension.  Overall critically ill. Echocardiogram will help to ensure we understand underlying cardiac function. If decreased LV function, may need inotropic support.   Signed, Sinclair Grooms, MD  09/04/2016, 8:09 AM

## 2016-09-05 ENCOUNTER — Inpatient Hospital Stay (HOSPITAL_COMMUNITY): Payer: Medicare Other

## 2016-09-05 ENCOUNTER — Encounter (HOSPITAL_COMMUNITY): Payer: Self-pay | Admitting: Surgery

## 2016-09-05 DIAGNOSIS — I2510 Atherosclerotic heart disease of native coronary artery without angina pectoris: Secondary | ICD-10-CM

## 2016-09-05 LAB — CBC
HEMATOCRIT: 29.2 % — AB (ref 39.0–52.0)
Hemoglobin: 9.3 g/dL — ABNORMAL LOW (ref 13.0–17.0)
MCH: 29.2 pg (ref 26.0–34.0)
MCHC: 31.8 g/dL (ref 30.0–36.0)
MCV: 91.8 fL (ref 78.0–100.0)
Platelets: 137 10*3/uL — ABNORMAL LOW (ref 150–400)
RBC: 3.18 MIL/uL — ABNORMAL LOW (ref 4.22–5.81)
RDW: 16.8 % — ABNORMAL HIGH (ref 11.5–15.5)
WBC: 20.2 10*3/uL — AB (ref 4.0–10.5)

## 2016-09-05 LAB — BASIC METABOLIC PANEL
Anion gap: 6 (ref 5–15)
BUN: 24 mg/dL — ABNORMAL HIGH (ref 6–20)
CHLORIDE: 110 mmol/L (ref 101–111)
CO2: 26 mmol/L (ref 22–32)
Calcium: 7 mg/dL — ABNORMAL LOW (ref 8.9–10.3)
Creatinine, Ser: 1.78 mg/dL — ABNORMAL HIGH (ref 0.61–1.24)
GFR calc non Af Amer: 37 mL/min — ABNORMAL LOW (ref >60)
GFR, EST AFRICAN AMERICAN: 43 mL/min — AB (ref >60)
Glucose, Bld: 109 mg/dL — ABNORMAL HIGH (ref 65–99)
Potassium: 3.8 mmol/L (ref 3.5–5.1)
SODIUM: 142 mmol/L (ref 135–145)

## 2016-09-05 LAB — GLUCOSE, CAPILLARY
GLUCOSE-CAPILLARY: 90 mg/dL (ref 65–99)
GLUCOSE-CAPILLARY: 83 mg/dL (ref 65–99)
GLUCOSE-CAPILLARY: 92 mg/dL (ref 65–99)
GLUCOSE-CAPILLARY: 98 mg/dL (ref 65–99)
Glucose-Capillary: 96 mg/dL (ref 65–99)
Glucose-Capillary: 93 mg/dL (ref 65–99)

## 2016-09-05 LAB — PHOSPHORUS: PHOSPHORUS: 2.8 mg/dL (ref 2.5–4.6)

## 2016-09-05 LAB — HEMOGLOBIN AND HEMATOCRIT, BLOOD
HEMATOCRIT: 28.6 % — AB (ref 39.0–52.0)
HEMOGLOBIN: 9.2 g/dL — AB (ref 13.0–17.0)

## 2016-09-05 LAB — MAGNESIUM: MAGNESIUM: 1.9 mg/dL (ref 1.7–2.4)

## 2016-09-05 MED ORDER — FUROSEMIDE 10 MG/ML IJ SOLN
40.0000 mg | Freq: Every day | INTRAMUSCULAR | Status: DC
  Administered 2016-09-05 – 2016-09-07 (×3): 40 mg via INTRAVENOUS
  Filled 2016-09-05 (×3): qty 4

## 2016-09-05 NOTE — Progress Notes (Signed)
PULMONARY / CRITICAL CARE MEDICINE   Name: Michael Heath MRN: [id number] DOB: Jul 26, 1945    ADMISSION DATE:  09/09/2016 CONSULTATION DATE:  08/25/2016  REFERRING MD:  Coral Else MD  CHIEF COMPLAINT:  Ruptured AAA aneurysm status post repair, acute hypoxic respiratory failure.  HISTORY OF PRESENT ILLNESS:   71 year old with past medical history of hypertension, hyperlipidemia, CKD, stage III. Admitted today with right flank, back pain. A CTA which showed ruptured AAA. He was transferred emergently to [facility] for stent repair. In the OR he received 16 units PRBC, 10 units FFP, 3 units platelets, 1 unit cryo and 1 Cell Saver. He developed abdominal comparment syndrome in OR from massive RP bleed. He is brought back to the ICU with an open abdomen. PCCM consulted for vent, sedation management.   SUBJECTIVE:  Pt miserably failed PST on 2/15. Was tachypneic and desaturated in the 70s.  On higher Fio2 now.  Higher neo dose Hb and Hct have been stable.  No further transfusion last 24 hrs.  More " mottling" in LE today Persistent fever, abx started 2/15   VITAL SIGNS: BP (!) 103/56   Pulse 78   Temp (!) 100.4 F (38 C)   Resp (!) 22   Ht 6\' 1"  (1.854 m)   Wt 100.7 kg (222 lb)   SpO2 99%   BMI 29.29 kg/m   HEMODYNAMICS: PAP: (19-58)/(8-39) 26/12 CVP:  [8 mmHg-29 mmHg] 14 mmHg  VENTILATOR SETTINGS: Vent Mode: PRVC FiO2 (%):  [50 %-100 %] 70 % Set Rate:  [20 bmp-22 bmp] 22 bmp Vt Set:  [640 mL] 640 mL PEEP:  [10 cmH20-12 cmH20] 10 cmH20 Pressure Support:  [10 cmH20] 10 cmH20 Plateau Pressure:  [24 cmH20-27 cmH20] 27 cmH20  INTAKE / OUTPUT: I/O last 3 completed shifts: In: 7505.3 [I.V.:6553.3; Blood:276; NG/GT:120; IV Piggyback:556] Out: 2355 [Urine:1655; Emesis/NG output:650; Blood:50]  PHYSICAL EXAMINATION: General:  Sedated, intubated, NAD.  Neuro: No focal deficits. CN grossly intact HEENT:  No thyromegaly, JVD. ETT in place.  Cardiovascular:  Regular rate and rhythm,  no murmurs rubs gallops Lungs: Good ae. Increased crackles at bases. Occasional wheeze, (-) rhonchi Abdomen:  Closed abdomen. Diminished BS.  Musculoskeletal:  Normal tone and bulk Skin: Intact. Gr 2 edema. Mottling in BLE. Warm extremities.   LABS:  BMET  Recent Labs Lab 09/04/16 0200 09/04/16 1458 09/05/16 0450  NA 144 142 142  K 4.0 4.0 3.8  CL 111 107 110  CO2 27 27 26   BUN 22* 22* 24*  CREATININE 1.78* 1.70* 1.78*  GLUCOSE 130* 111* 109*    Electrolytes  Recent Labs Lab 08/22/2016 1630  09/04/16 0200 09/04/16 1458 09/05/16 0450  CALCIUM  --   < > 6.7* 7.0* 7.0*  MG 1.6*  --  1.5*  --  1.9  PHOS 3.3  --  2.9  --  2.8  < > = values in this interval not displayed.  CBC  Recent Labs Lab 09/04/16 0200  09/04/16 1458 09/04/16 2026 09/05/16 0223 09/05/16 0450  WBC 20.6*  --  22.3*  --   --  20.2*  HGB 10.1*  < > 10.4* 9.5* 9.2* 9.3*  HCT 30.0*  < > 32.0* 29.4* 28.6* 29.2*  PLT 136*  --  145*  --   --  137*  < > = values in this interval not displayed.  Coag's  Recent Labs Lab 09/09/2016 2007  09/08/2016 1630 08/22/2016 2055 09/04/16 1458  APTT 43*  --  29  --  28  INR 1.45  < > 1.22 1.41 1.32  < > = values in this interval not displayed.  Sepsis Markers  Recent Labs Lab 08/23/2016 0842 09/04/16 1459 09/04/16 2115  LATICACIDVEN 1.3 1.4 1.4    ABG  Recent Labs Lab 08/26/2016 2339 09/04/16 0416 09/04/16 1500  PHART 7.385 7.478* 7.418  PCO2ART 49.4* 42.1 47.4  PO2ART 81.0* 94.0 59.0*    Liver Enzymes  Recent Labs Lab 08/23/2016 1630 09/04/16 0200 09/04/16 1458  AST 36 28 29  ALT 22 17 18   ALKPHOS 33* 32* 38  BILITOT 1.2 1.9* 3.7*  ALBUMIN 2.1* 2.1* 2.0*    Cardiac Enzymes  Recent Labs Lab 08/28/2016 0842 08/28/2016 1200 09/15/2016 2055  TROPONINI 0.17* 0.16* 0.11*    Glucose  Recent Labs Lab 09/04/16 1509 09/04/16 2031 09/04/16 2357 09/05/16 0122 09/05/16 0414 09/05/16 0720  GLUCAP 85 114* 112* 96 90 93    Imaging Dg  Chest Port 1 View  Result Date: 09/05/2016 CLINICAL DATA:  Intubation. EXAM: PORTABLE CHEST 1 VIEW COMPARISON:  09/04/2016.  09/08/2016. FINDINGS: Endotracheal tube, NG tube, Swan-Ganz catheter in stable position. Tip of Swan-Ganz catheter projected over right ventricle. Cardiomegaly with bilateral pulmonary infiltrates again noted consistent pulmonary edema. Small bilateral pleural effusions again noted. No pneumothorax . IMPRESSION: 1. Lines and tubes in stable position. Swan-Ganz catheter tip noted projected over the right ventricle. 2. Congestive heart failure with bilateral pulmonary edema and small pleural effusions. No change from prior exam. Electronically Signed   By: Maisie Fushomas  Register   On: 09/05/2016 07:43     STUDIES:  CT Angio chest, abdomen 09/13/2016- ruptured 7.8 cm abdominal aortic aneurysm with a large RP hematoma. Moderate emphysema. No focal pneumonia, consolidation, opacities. CXR 09/01/16- COPD, no active cardio pulmonary disease All images personally reviewed. Echo 2/15 > N EF, diastolic dysfxn  CULTURES: MRSA 2/14 > (-) Sputum 2/14 > Blood 2/14 >   ANTIBIOTICS: Cefuroxime 2/13> 2/14 Periop Vanc 2/15 > Zosyn 2/15 >    SIGNIFICANT EVENTS: 2/13- Admit, OR for ruptured AAA. 2/14- Back to OR for abdomina closure.    LINES/TUBES: ETT 2/13 Rt IJ cordis 2/13   DISCUSSION: Patient admitted  for acute AAA rupture and had endovascular repair of ruptured AAA and ex-lap for abdominal compartment syndrome. Abdomen was kept open after procedure then kept intubated post op. Patient with hypoxemia and resp failure  after receiveing significant amounts of blood products. Pt went back to OR on 2/14 for abdominal closure. Remains on the vent.    Acute Hypoxemic Respiratory Failure/ARDS 2/2  TRALI with significant amount of blood products transfusion + Pulm edema / volume overload + Demand ischemia + Possible HCAP LLL - pt is volume overloaded and still on neosynephrine drip.   Neo drip is at a low dose today.   - start lasix 40 mg IV daily.  May increase dose depending on BP. I anticipate he will tolerate diuresis - Failed PST midserably on 2/15 - started Vanc and Zosyn on 2/15 for persistent fevers/ possible HCAP - Continue ARDS protocol. On 70% FiO2 and PEEP 10. Keep o2 sats > 88%. Try to cut down Fio2.  - Continue with sedation (versed and fentanyl drips). Cutting down on versed drip > plan to transition to versed pushes - will decrease IVF rate to 50 mls/hr from 100 mls h/r.  Suggest D/C IVF if OK with surgery.    Abdominal compartment syndrome. S/P ExLap 2/13 and S/P Abs closure 2/14 -Per Surgery -NGT inserted by  GI 2/2 h/o esophageal stricture.  Per GI, no stricture seen. Pt just has a large tongue in the way.    S/P Endovascular Repair of Ruptured AAA 2/13 with Closure of abdomen on 2/14 - on neosynephrine >> attempt to wean off - Hb and Hct have been stable. Observe for now - no acitve bleeding right now (still on pressors though)  AKI - he is volume overloaded.  Start lasix 2/16.  Increase dose of lasix once off neosynpehrine drip.   Best Practice - on SCD, Lovenox SQ - on PPI for SUP    I spent  35  minutes of Critical Care time with this patient today.  Wife updated at bedside.   Pollie Meyer, MD 09/05/2016, 9:29 AM [facility] Pager [phone] After 3 pm or if no answer, call [phone]

## 2016-09-05 NOTE — Progress Notes (Signed)
The patient has been seen in conjunction with Reino Bellis, NP-C. All aspects of care have been considered and discussed. The patient has been personally interviewed, examined, and all clinical data has been reviewed.   Overall, heart is okay with good LV function despite stress of ruptured AAA.  Plan to follow at a distance and help as required.  Progress Note  Patient Name: Michael Heath Date of Encounter: 09/05/2016  Primary Cardiologist: [place]. [place]  Subjective   Remains intubated, attempting to wean.   Inpatient Medications    Scheduled Meds: . cefUROXime (ZINACEF)  IV  1.5 g Intravenous Once  . chlorhexidine gluconate (MEDLINE KIT)  15 mL Mouth Rinse BID  . docusate sodium  100 mg Oral Daily  . enoxaparin (LOVENOX) injection  40 mg Subcutaneous Q24H  . fentaNYL (SUBLIMAZE) injection  50 mcg Intravenous Once  . furosemide  40 mg Intravenous Daily  . insulin aspart  1-3 Units Subcutaneous Q4H  . mouth rinse  15 mL Mouth Rinse QID  . pantoprazole (PROTONIX) IV  40 mg Intravenous Q12H  . piperacillin-tazobactam (ZOSYN)  IV  3.375 g Intravenous Q8H  . vancomycin  750 mg Intravenous Q12H   Continuous Infusions: . sodium chloride 50 mL/hr at 09/05/16 1000  . fentaNYL infusion INTRAVENOUS 350 mcg/hr (09/05/16 1000)  . midazolam (VERSED) infusion 2 mg/hr (09/05/16 1000)  . phenylephrine (NEO-SYNEPHRINE) Adult infusion 40 mcg/min (09/05/16 1000)   PRN Meds: sodium chloride, acetaminophen **OR** acetaminophen, albuterol, artificial tears, bisacodyl, fentaNYL, fentaNYL (SUBLIMAZE) injection, fentaNYL (SUBLIMAZE) injection, hydrALAZINE, HYDROmorphone (DILAUDID) injection, labetalol, metoprolol, midazolam, ondansetron, phenol, potassium chloride   Vital Signs    Vitals:   09/05/16 0915 09/05/16 0930 09/05/16 0945 09/05/16 1000  BP: (!) 103/56 (!) 101/57 (!) 95/56 (!) 97/57  Pulse: 78 74 74 73  Resp: (!) 22 (!) 22 (!) 22 (!) 22  Temp: (!) 100.4 F (38 C) (!) 100.4  F (38 C) (!) 100.4 F (38 C) (!) 100.4 F (38 C)  TempSrc:    Core (Comment)  SpO2: 99% 99% 100% 100%  Weight:      Height:        Intake/Output Summary (Last 24 hours) at 09/05/16 1018 Last data filed at 09/05/16 1000  Gross per 24 hour  Intake          4124.24 ml  Output             1540 ml  Net          2584.24 ml   Filed Weights   08/29/2016 1241  Weight: 222 lb (100.7 kg)    Telemetry   SR no ectopy- Telemetry personally reviewed.  ECG    ECG from yesterday reveals normal sinus rhythm without acute ST-T wave change - Personally Reviewed  Physical Exam   GEN: Intubated Neck: + JVD Cardiac: RRR, without rub or gallops. Global edema upper and lower extremities. 2+ DP pulses.  Respiratory:  no obvious wheezing. Diminished breath sounds. GI: Soft, nontender, non-distended, now closed with staples intact. MS: No deformity.  Neuro:   sedated and paralyzed Psych: Intubated and sedated  Labs    Chemistry Recent Labs Lab 09/01/2016 1630  09/04/16 0200 09/04/16 1458 09/05/16 0450  NA  --   < > 144 142 142  K  --   < > 4.0 4.0 3.8  CL  --   < > 111 107 110  CO2  --   < > 27 27 26  GLUCOSE  --   < > 130* 111* 109*  BUN  --   < > 22* 22* 24*  CREATININE  --   < > 1.78* 1.70* 1.78*  CALCIUM  --   < > 6.7* 7.0* 7.0*  PROT 4.1*  --  4.1* 4.9*  --   ALBUMIN 2.1*  --  2.1* 2.0*  --   AST 36  --  28 29  --   ALT 22  --  17 18  --   ALKPHOS 33*  --  32* 38  --   BILITOT 1.2  --  1.9* 3.7*  --   GFRNONAA  --   < > 37* 39* 37*  GFRAA  --   < > 43* 45* 43*  ANIONGAP  --   < > 6 8 6   < > = values in this interval not displayed.   Hematology Recent Labs Lab 09/04/16 0200  09/04/16 1458 09/04/16 2026 09/05/16 0223 09/05/16 0450  WBC 20.6*  --  22.3*  --   --  20.2*  RBC 3.43*  --  3.58*  --   --  3.18*  HGB 10.1*  < > 10.4* 9.5* 9.2* 9.3*  HCT 30.0*  < > 32.0* 29.4* 28.6* 29.2*  MCV 87.5  --  89.4  --   --  91.8  MCH 29.4  --  29.1  --   --  29.2  MCHC 33.7   --  32.5  --   --  31.8  RDW 16.6*  --  16.8*  --   --  16.8*  PLT 136*  --  145*  --   --  137*  < > = values in this interval not displayed.  Cardiac Enzymes  Recent Labs Lab 09/05/2016 2230 09/07/2016 0842 09/15/2016 1200 08/30/2016 2055  TROPONINI 0.12* 0.17* 0.16* 0.11*     Recent Labs Lab 08/28/2016 1501  TROPIPOC 0.01     BNPNo results for input(s): BNP, PROBNP in the last 168 hours.   DDimer   Recent Labs Lab 08/29/2016 1739 08/30/2016 1630  DDIMER 3.11* >20.00*     Radiology    Dg Chest Port 1 View  Result Date: 09/05/2016 CLINICAL DATA:  Intubation. EXAM: PORTABLE CHEST 1 VIEW COMPARISON:  09/04/2016.  09/13/2016. FINDINGS: Endotracheal tube, NG tube, Swan-Ganz catheter in stable position. Tip of Swan-Ganz catheter projected over right ventricle. Cardiomegaly with bilateral pulmonary infiltrates again noted consistent pulmonary edema. Small bilateral pleural effusions again noted. No pneumothorax . IMPRESSION: 1. Lines and tubes in stable position. Swan-Ganz catheter tip noted projected over the right ventricle. 2. Congestive heart failure with bilateral pulmonary edema and small pleural effusions. No change from prior exam. Electronically Signed   By: Marcello Moores  Register   On: 09/05/2016 07:43   Dg Chest Port 1 View  Result Date: 09/04/2016 CLINICAL DATA:  Respiratory failure. Abdominal aortic aneurysm repair. EXAM: PORTABLE CHEST 1 VIEW COMPARISON:  09/05/2016. FINDINGS: Unchanged cardiomediastinal silhouette. ETT roughly 6 cm above carina. Swan-Ganz catheter poorly visualized, overlying the spine, probably lies in the RIGHT atrium or RIGHT ventricular outflow tract and should be advanced or withdrawn. Orogastric tube tip not visualized. LEFT pulmonary opacity, likely atelectasis, effusion, with possible infiltrate, stable. IMPRESSION: Stable aeration. Swan-Ganz catheter tip not clearly visualized, but probably too low. Correlate with waveforms for advancement or withdrawal.  Electronically Signed   By: Staci Righter M.D.   On: 09/04/2016 07:39   Dg Chest Port 1 View  Result Date: 08/24/2016  CLINICAL DATA:  Endotracheal tube placements EXAM: PORTABLE CHEST 1 VIEW COMPARISON:  Chest radiograph 09/04/2016 at 6 o\'clock a.m. FINDINGS: Endotracheal tube tip is in unchanged position at the level of the clavicular heads. Nasogastric tube is skull below the diaphragm. The right IJ approach pulmonary arterial catheter has been retracted and the tip is now likely at the right ventricular outflow tract. Bibasilar opacities are unchanged. Small left pleural effusion. IMPRESSION: 1. Unchanged position endotracheal tube, in radiographically appropriate position. 2. Retraction of pulmonary arterial catheter with tip now likely at the right ventricular outflow tract. 3. Unchanged bibasilar opacities. Electronically Signed   By: Ulyses Jarred M.D.   On: 08/30/2016 21:25    Cardiac Studies   TTE: 09/04/16  Study Conclusions  - Left ventricle: The cavity size was normal. There was mild   concentric hypertrophy. Systolic function was normal. The   estimated ejection fraction was in the range of 60% to 65%. Wall   motion was normal; there were no regional wall motion   abnormalities. There was an increased relative contribution of   atrial contraction to ventricular filling. Doppler parameters are   consistent with abnormal left ventricular relaxation (grade 1   diastolic dysfunction). - Ascending aorta: The ascending aorta was upper limit of normal in   size. - Mitral valve: Systolic bowing without prolapse. - Left atrium: The atrium was mildly dilated.  Patient Profile     71 y.o. male 71 year old with no prior vascular history presenting with ruptured abdominal aortic aneurysm. Status post aneurysm repair, required multiple transfusions and crystalloid for hemodynamic support. Has hypoxic respiratory failure, acute kidney injury, and noted on CT to have coronary calcification.  Trivial elevation in troponin without peak and trough to suggest ACS.  Assessment & Plan    1. Elevated troponin: appears to be related to demand burden and recurring hypotension. No evidence for ACS at this point.  -- Echo showed normal EF and no WMA,G1DD  2. Previous asymptomatic coronary artery disease: confirmed by the presence of coronary calcification on CT.  3. Shock state: secondary to ruptured aneurysm and multiorgan dysfunction due to hypotension. Remains on neo gtt, but attempting to wean. Developed persistent fever, now on antibiotics.   Signed, Reino Bellis, NP  09/05/2016, 10:18 AM

## 2016-09-05 NOTE — Progress Notes (Signed)
Subjective  - POD #3  s/p EVAR and ex-lap for ruptured                         POD#2, s/p abd washout and closure  Remains intubated   Physical Exam:  abd soft Feet warm Awakes to stimulation       Assessment/Plan:  POD #3/2  CV: still Requiring Neo, but able to wean down to 30mmcg. Will continue to monitor.  Troponin initially elevatedelevated. Possibly demand ischemia. Cardiology workup compete Pulm: recommend weaning vent and trying to extubate GI: OG placed by GI.  No esophageal stenosis.   Consider TF once bowel function returns.   Abd wound now closed UJ:[id number]:Creatinine stable and good uop. Will continue to monitor UOP which has trended down recently.  Keep foley in for UOP IVF:  BP has decreased as well as UOP with stopping IVF.  IVF decreased to 50cc/hr.  Lasix givenr Prophylaxis: will starting lovenox And protonix ABL: Hb remains stable, continue with serial labs  Brabham, [place] 09/05/2016 1:59 PM --  Vitals:   09/05/16 1300 09/05/16 1315  BP: (!) 94/55 (!) 90/54  Pulse:  88  Resp: (!) 22 (!) 22  Temp:      Intake/Output Summary (Last 24 hours) at 09/05/16 1359 Last data filed at 09/05/16 1328  Gross per 24 hour  Intake          4286.08 ml  Output             2315 ml  Net          1971.08 ml     Laboratory CBC    Component Value Date/Time   WBC 20.2 (H) 09/05/2016 0450   HGB 9.3 (L) 09/05/2016 0450   HCT 29.2 (L) 09/05/2016 0450   HCT 47.6 05/01/2016 1001   PLT 137 (L) 09/05/2016 0450   PLT 230 05/01/2016 1001    BMET    Component Value Date/Time   NA 142 09/05/2016 0450   NA 134 08/25/2016 1335   K 3.8 09/05/2016 0450   CL 110 09/05/2016 0450   CO2 26 09/05/2016 0450   GLUCOSE 109 (H) 09/05/2016 0450   BUN 24 (H) 09/05/2016 0450   BUN 26 08/25/2016 1335   CREATININE 1.78 (H) 09/05/2016 0450   CALCIUM 7.0 (L) 09/05/2016 0450   GFRNONAA 37 (L) 09/05/2016 0450   GFRAA 43 (L) 09/05/2016 0450    COAG Lab Results  Component  Value Date   INR 1.32 09/04/2016   INR 1.41 08/30/2016   INR 1.22 09/02/2016   No results found for: PTT  Antibiotics Anti-infectives    Start     Dose/Rate Route Frequency Ordered Stop   09/04/16 1600  vancomycin (VANCOCIN) IVPB 750 mg/150 ml premix     750 mg 150 mL/hr over 60 Minutes Intravenous Every 12 hours 09/04/16 1451     09/04/16 1500  piperacillin-tazobactam (ZOSYN) IVPB 3.375 g     3.375 g 12.5 mL/hr over 240 Minutes Intravenous Every 8 hours 09/04/16 1451     09/14/2016 1900  cefUROXime (ZINACEF) 1.5 g in dextrose 5 % 50 mL IVPB     1.5 g 100 mL/hr over 30 Minutes Intravenous  Once 08/23/2016 1849     Nov 14, 2016 2015  cefUROXime (ZINACEF) 1.5 g in dextrose 5 % 50 mL IVPB     1.5 g 100 mL/hr over 30 Minutes Intravenous Every 12 hours Nov 14, 2016 2006 09/15/2016 1013  Eldridge Abrahams, M.D. [facility] Office: [phone] Pager:  [phone]

## 2016-09-05 NOTE — Progress Notes (Signed)
RT set up Safe Set for blood drawls-no Arterial line.

## 2016-09-06 DIAGNOSIS — A419 Sepsis, unspecified organism: Secondary | ICD-10-CM

## 2016-09-06 DIAGNOSIS — R6521 Severe sepsis with septic shock: Secondary | ICD-10-CM

## 2016-09-06 LAB — TYPE AND SCREEN
ABO/RH(D): A NEG
Antibody Screen: NEGATIVE
UNIT DIVISION: 0
UNIT DIVISION: 0
UNIT DIVISION: 0
UNIT DIVISION: 0
UNIT DIVISION: 0
UNIT DIVISION: 0
UNIT DIVISION: 0
UNIT DIVISION: 0
UNIT DIVISION: 0
UNIT DIVISION: 0
UNIT DIVISION: 0
UNIT DIVISION: 0
UNIT DIVISION: 0
UNIT DIVISION: 0
UNIT DIVISION: 0
Unit division: 0
Unit division: 0
Unit division: 0
Unit division: 0
Unit division: 0
Unit division: 0
Unit division: 0
Unit division: 0
Unit division: 0
Unit division: 0
Unit division: 0
Unit division: 0
Unit division: 0

## 2016-09-06 LAB — GLUCOSE, CAPILLARY
GLUCOSE-CAPILLARY: 83 mg/dL (ref 65–99)
GLUCOSE-CAPILLARY: 86 mg/dL (ref 65–99)
GLUCOSE-CAPILLARY: 87 mg/dL (ref 65–99)
Glucose-Capillary: 78 mg/dL (ref 65–99)
Glucose-Capillary: 99 mg/dL (ref 65–99)
Glucose-Capillary: 96 mg/dL (ref 65–99)
Glucose-Capillary: 111 mg/dL — ABNORMAL HIGH (ref 65–99)

## 2016-09-06 LAB — BLOOD GAS, ARTERIAL
ACID-BASE EXCESS: 1.3 mmol/L (ref 0.0–2.0)
Bicarbonate: 26 mmol/L (ref 20.0–28.0)
DRAWN BY: 225631
FIO2: 50
MECHVT: 0.64 mL
O2 SAT: 90.9 %
PEEP/CPAP: 10 cmH2O
PH ART: 7.357 (ref 7.350–7.450)
PO2 ART: 66.3 mmHg — AB (ref 83.0–108.0)
Patient temperature: 99.9
RATE: 22 resp/min
pCO2 arterial: 48 mmHg (ref 32.0–48.0)

## 2016-09-06 LAB — BASIC METABOLIC PANEL
Anion gap: 9 (ref 5–15)
BUN: 28 mg/dL — AB (ref 6–20)
CALCIUM: 7.6 mg/dL — AB (ref 8.9–10.3)
CHLORIDE: 109 mmol/L (ref 101–111)
CO2: 25 mmol/L (ref 22–32)
CREATININE: 1.91 mg/dL — AB (ref 0.61–1.24)
GFR calc non Af Amer: 34 mL/min — ABNORMAL LOW (ref >60)
GFR, EST AFRICAN AMERICAN: 39 mL/min — AB (ref >60)
Glucose, Bld: 96 mg/dL (ref 65–99)
Potassium: 3.7 mmol/L (ref 3.5–5.1)
SODIUM: 143 mmol/L (ref 135–145)

## 2016-09-06 LAB — CBC
HCT: 28 % — ABNORMAL LOW (ref 39.0–52.0)
HEMOGLOBIN: 8.9 g/dL — AB (ref 13.0–17.0)
MCH: 29.5 pg (ref 26.0–34.0)
MCHC: 31.8 g/dL (ref 30.0–36.0)
MCV: 92.7 fL (ref 78.0–100.0)
Platelets: 146 10*3/uL — ABNORMAL LOW (ref 150–400)
RBC: 3.02 MIL/uL — ABNORMAL LOW (ref 4.22–5.81)
RDW: 16.6 % — AB (ref 11.5–15.5)
WBC: 18.4 10*3/uL — ABNORMAL HIGH (ref 4.0–10.5)

## 2016-09-06 LAB — CULTURE, RESPIRATORY: CULTURE: NORMAL

## 2016-09-06 LAB — MAGNESIUM: MAGNESIUM: 1.9 mg/dL (ref 1.7–2.4)

## 2016-09-06 LAB — PHOSPHORUS: PHOSPHORUS: 3.1 mg/dL (ref 2.5–4.6)

## 2016-09-06 LAB — VANCOMYCIN, TROUGH: VANCOMYCIN TR: 15 ug/mL (ref 15–20)

## 2016-09-06 LAB — CULTURE, RESPIRATORY W GRAM STAIN: Special Requests: NORMAL

## 2016-09-06 MED ORDER — VITAL HIGH PROTEIN PO LIQD
1000.0000 mL | ORAL | Status: DC
  Administered 2016-09-06: 22:00:00
  Administered 2016-09-06: 1000 mL
  Administered 2016-09-06 – 2016-09-07 (×5)
  Administered 2016-09-07 – 2016-09-08 (×2): 1000 mL
  Administered 2016-09-09: 20:00:00
  Administered 2016-09-09: 1000 mL
  Administered 2016-09-10 (×4)
  Filled 2016-09-06: qty 1000

## 2016-09-06 MED ORDER — PRO-STAT SUGAR FREE PO LIQD
30.0000 mL | Freq: Two times a day (BID) | ORAL | Status: DC
  Administered 2016-09-06 – 2016-09-10 (×9): 30 mL
  Filled 2016-09-06 (×9): qty 30

## 2016-09-06 NOTE — Progress Notes (Signed)
Pharmacy Antibiotic Note  Michael Heath is a 71 y.o. male continues on day#3 vancomycin and zosyn for possible LLL HCAP. Renal function worse today with sCr up to 1.9, however, vancomycin trough therapeutic at 15 (goal 15-20). Zosyn dose remains appropriate.  Plan: 1) Continue vancomycin 750mg  IV q12 2) Continue zosyn 3.375g IV q8 (EI)  Height: 6\' 1"  (185.4 cm) Weight: 222 lb (100.7 kg) IBW/kg (Calculated) : 79.9  Temp (24hrs), Avg:99.7 F (37.6 C), Min:99.3 F (37.4 C), Max:100.2 F (37.9 C)   Recent Labs Lab 08/31/2016 1509  09/11/2016 0842  09/11/2016 2055 09/04/16 0200 09/04/16 1458 09/04/16 1459 09/04/16 2115 09/05/16 0450 09/06/16 0430 09/06/16 1530  WBC  --   < >  --   < > 15.3* 20.6* 22.3*  --   --  20.2* 18.4*  --   CREATININE  --   < > 1.85*  < > 1.55* 1.78* 1.70*  --   --  1.78* 1.91*  --   LATICACIDVEN 9.75*  --  1.3  --   --   --   --  1.4 1.4  --   --   --   VANCOTROUGH  --   --   --   --   --   --   --   --   --   --   --  15  < > = values in this interval not displayed.  Estimated Creatinine Clearance: 44.3 mL/min (by C-G formula based on SCr of 1.91 mg/dL (H)).    No Known Allergies  Antimicrobials this admission: 2/15 Vancomycin >> 2/15 Zosyn >>  Dose adjustments this admission:  Microbiology results: 2/14 resp>> normal flora 2/15 blood x2>> ngtd  Thank you for allowing pharmacy to be a part of this patient's care.  Fredrik RiggerMarkle, Kela Baccari Sue 09/06/2016 5:26 PM

## 2016-09-06 NOTE — Progress Notes (Signed)
Nutrition Follow-up  DOCUMENTATION CODES:  Not applicable  INTERVENTION:  Initiate trickle TF via OGT with VHP at goal rate of 20 ml/h (480ml per day) and Prostat 30 ml BID to provide 680 kcals, 72 gm protein, 401 ml free water daily.  Monitor tolerance and for MD approval to advance to goal.   NUTRITION DIAGNOSIS:  Inadequate oral intake related to inability to eat as evidenced by NPO status.  GOAL:  Patient will meet greater than or equal to 90% of their needs  MONITOR:  Vent status, Labs, Weight trends, Skin, I & O's  REASON FOR ASSESSMENT:  Consult Enteral/tube feeding initiation and management  ASSESSMENT:  [name] wiht PMH of HTN, HLD, CKD, stage III. Admitted with right flank, back pain. A CTA which showed ruptured AAA. [facility].    RD consulted for TRICKLE tf.   VAC removed. Still on pressors. RN states FAINT bowel sounds  Map: 75, BP. 104-62  No documented bm yet.   Patient is currently intubated on ventilator support MV: 13.6 L/min Temp (24hrs), Avg:100 F (37.8 C), Min:99.3 F (37.4 C), Max:100.8 F (38.2 C)  Propofol: None  Labs: Renal Labs trending up. WBC: 18.4, BG 80-100 Medications: IV abx, Colace, Lasix, ppi, IVF, Fent/Versed, Neo-epi   Recent Labs Lab 09/04/16 0200 09/04/16 1458 09/05/16 0450 09/06/16 0430  NA 144 142 142 143  K 4.0 4.0 3.8 3.7  CL 111 107 110 109  CO2 27 27 26 25   BUN 22* 22* 24* 28*  CREATININE 1.78* 1.70* 1.78* 1.91*  CALCIUM 6.7* 7.0* 7.0* 7.6*  MG 1.5*  --  1.9 1.9  PHOS 2.9  --  2.8 3.1  GLUCOSE 130* 111* 109* 96   Diet Order:  Diet NPO time specified  Skin: Multiple closed surgical incisions  Last BM:  Unknown  Height:  Ht Readings from Last 1 Encounters:  08/22/2016 6\' 1"  (1.854 m)   Weight:  Wt Readings from Last 1 Encounters:  08/27/2016 222 lb (100.7 kg)   Wt Readings from Last 10 Encounters:  09/13/2016 222 lb (100.7 kg)  09/01/16 219 lb 12.8 oz  (99.7 kg)  08/25/16 222 lb 9.6 oz (101 kg)  05/01/16 221 lb (100.2 kg)  01/28/16 219 lb 12.8 oz (99.7 kg)  12/25/15 221 lb (100.2 kg)  02/24/13 232 lb (105.2 kg)  02/15/13 232 lb (105.2 kg)   Ideal Body Weight:  83.6 kg  BMI:  Body mass index is 29.29 kg/m.  Estimated Nutritional Needs:  Kcal:  2330 Protein:  125-150 g Pro (1.5-1.8 g/kg ibw) Fluid:  Per MD  EDUCATION NEEDS:  No education needs identified at this time  Christophe LouisNathan Trea Latner RD, LDN, CNSC Clinical Nutrition Pager: [phone] 09/06/2016 11:36 AM

## 2016-09-06 NOTE — Progress Notes (Signed)
PULMONARY / CRITICAL CARE MEDICINE   Name: Michael Heath MRN: [id number] DOB: 03-20-1946    ADMISSION DATE:  09/14/2016 CONSULTATION DATE:  09/15/2016  REFERRING MD:  Coral ElseVance Brabham MD  CHIEF COMPLAINT:  Ruptured AAA aneurysm status post repair, acute hypoxic respiratory failure.  HISTORY OF PRESENT ILLNESS:   71 year old with past medical history of hypertension, hyperlipidemia, CKD, stage III. Admitted today with right flank, back pain. A CTA which showed ruptured AAA. [facility]. In the OR he received 16 units PRBC, 10 units FFP, 3 units platelets, 1 unit cryo and 1 Cell Saver. He developed abdominal comparment syndrome in OR from massive RP bleed. He is brought back to the ICU with an open abdomen. PCCM consulted for vent, sedation management.   SUBJECTIVE:  Unable to wean.  Remains on pressors , weaning neo -currently 25mcg  Not following commands , Per RN , followed some commands.  Weaning sedation   VITAL SIGNS: BP 128/80   Pulse 93   Temp 99.3 F (37.4 C) (Axillary)   Resp (!) 22   Ht 6\' 1"  (1.854 m)   Wt 222 lb (100.7 kg)   SpO2 100%   BMI 29.29 kg/m   HEMODYNAMICS: PAP: (23-45)/(11-35) 24/13 CVP:  [13 mmHg-28 mmHg] 15 mmHg  VENTILATOR SETTINGS: Vent Mode: PRVC FiO2 (%):  [50 %-70 %] 60 % Set Rate:  [22 bmp] 22 bmp Vt Set:  [640 mL] 640 mL PEEP:  [10 cmH20] 10 cmH20 Plateau Pressure:  [22 cmH20-25 cmH20] 24 cmH20  INTAKE / OUTPUT: I/O last 3 completed shifts: In: 5760.1 [I.V.:4910.1; NG/GT:150; IV Piggyback:700] Out: 3175 [Urine:2525; Emesis/NG output:650]  PHYSICAL EXAMINATION: General:  Sedated, intubated, NAD.  Neuro: sedated, on vent , f/c intermittently  HEENT:  No thyromegaly, JVD. ETT in place.  Cardiovascular:  Regular rate and rhythm, no murmurs rubs gallops Lungs: decreased BS in bases  Abdomen:  Closed abdomen. Diminish BS  Musculoskeletal:  Normal tone and bulk Skin: Intact. Gr 2+ edema.  Mottling in BLE. Warm extremities.   LABS:  BMET  Recent Labs Lab 09/04/16 1458 09/05/16 0450 09/06/16 0430  NA 142 142 143  K 4.0 3.8 3.7  CL 107 110 109  CO2 27 26 25   BUN 22* 24* 28*  CREATININE 1.70* 1.78* 1.91*  GLUCOSE 111* 109* 96    Electrolytes  Recent Labs Lab 09/04/16 0200 09/04/16 1458 09/05/16 0450 09/06/16 0430  CALCIUM 6.7* 7.0* 7.0* 7.6*  MG 1.5*  --  1.9 1.9  PHOS 2.9  --  2.8 3.1    CBC  Recent Labs Lab 09/04/16 1458  09/05/16 0223 09/05/16 0450 09/06/16 0430  WBC 22.3*  --   --  20.2* 18.4*  HGB 10.4*  < > 9.2* 9.3* 8.9*  HCT 32.0*  < > 28.6* 29.2* 28.0*  PLT 145*  --   --  137* 146*  < > = values in this interval not displayed.  Coag's  Recent Labs Lab 08/29/2016 2007  09/11/2016 1630 08/30/2016 2055 09/04/16 1458  APTT 43*  --  29  --  28  INR 1.45  < > 1.22 1.41 1.32  < > = values in this interval not displayed.  Sepsis Markers  Recent Labs Lab 08/26/2016 0842 09/04/16 1459 09/04/16 2115  LATICACIDVEN 1.3 1.4 1.4    ABG  Recent Labs Lab 09/04/16 0416 09/04/16 1500 09/06/16 0430  PHART 7.478* 7.418 7.357  PCO2ART 42.1 47.4 48.0  PO2ART 94.0 59.0*  66.3*    Liver Enzymes  Recent Labs Lab 09/09/2016 1630 09/04/16 0200 09/04/16 1458  AST 36 28 29  ALT 22 17 18   ALKPHOS 33* 32* 38  BILITOT 1.2 1.9* 3.7*  ALBUMIN 2.1* 2.1* 2.0*    Cardiac Enzymes  Recent Labs Lab 08/27/2016 0842 09/04/2016 1200 09/13/2016 2055  TROPONINI 0.17* 0.16* 0.11*    Glucose  Recent Labs Lab 09/05/16 1135 09/05/16 1538 09/05/16 1923 09/05/16 2326 09/06/16 0323 09/06/16 0726  GLUCAP 83 92 98 86 78 99    Imaging No results found.   STUDIES:  CT Angio chest, abdomen Sep 10, 2016- ruptured 7.8 cm abdominal aortic aneurysm with a large RP hematoma. Moderate emphysema. No focal pneumonia, consolidation, opacities. CXR 09/01/16- COPD, no active cardio pulmonary disease All images personally reviewed. Echo 2/15 > N EF, diastolic  dysfxn  CULTURES: MRSA 2/14 > (-) Sputum 2/14 >norm flora  Blood 2/14 >   ANTIBIOTICS: Cefuroxime 2/13> 2/14 Periop Vanc 2/15 > Zosyn 2/15 >    SIGNIFICANT EVENTS: 2/13- Admit, OR for ruptured AAA. 2/14- Back to OR for abdomina closure.    LINES/TUBES: ETT 2/13 Rt IJ cordis 2/13   DISCUSSION: Patient admitted  for acute AAA rupture and had endovascular repair of ruptured AAA and ex-lap for abdominal compartment syndrome. Abdomen was kept open after procedure then kept intubated post op. Patient with hypoxemia and resp failure  after receiveing significant amounts of blood products. Pt went back to OR on 2/14 for abdominal closure. Remains on the vent.    Acute Hypoxemic Respiratory Failure/ARDS 2/2  TRALI with significant amount of blood products transfusion + Pulm edema / volume overload + Demand ischemia + Possible HCAP LLL - pt is volume overloaded and still on neosynephrine drip.  Neo drip is at a low dose today.   - Lasix started 2/16  - started Vanc and Zosyn on 2/15 for persistent fevers/ possible HCAP-follow cx data  - Continue ARDS protocol. On 70% FiO2 and PEEP 10. Keep o2 sats > 88%.  - Continue with sedation (versed and fentanyl drips).  -wean as able   -Eval for daily SBT/Wean  - KVO IVF   -check cxr in am   Abdominal compartment syndrome. S/P ExLap 2/13 and S/P Abs closure 2/14 -Per Surgery -NGT inserted by GI 2/2 h/o esophageal stricture.  Per GI, no stricture seen. Pt just has a large tongue in the way.  -Trickle TF ordered 2/17 per CCS    S/P Endovascular Repair of Ruptured AAA 2/13 with Closure of abdomen on 2/14 - on neosynephrine >> attempt to wean for MAP >65  - Hb and Hct have been stable. Observe for now - no acitve bleeding right now (still on pressors though)  AKI - he is volume overloaded.  Started lasix 2/16.  Increase dose of lasix once off neosynpehrine drip.   Best Practice - on SCD, Lovenox SQ - on PPI for SUP  [place]ana Bryngelson NP-C   [facility]  [phone]   09/06/2016

## 2016-09-06 NOTE — Progress Notes (Signed)
   VASCULAR SURGERY ASSESSMENT & PLAN:  4 Days Post-Op s/p: Endovascular repair of ruptured abdominal aortic aneurysm, evacuation of retroperitoneal hematoma, and placement of abdominal wound VAC.  CARDIAC: Hemodynamically stable. Still on Neo.  PULMONARY: Vent wean per CCM  RENAL: Creatinine is leveling off. Today creatinine is 1.9. Potassium is 3.7.  NUTRITION: Will begin trickle tube feeds.  ID: On vancomycin and Zosyn for pneumonia. White blood cell count is 18.4 and is trending downward.  DVT PROPHYLAXIS: On Lovenox.  SUBJECTIVE: Sedated on vent.  PHYSICAL EXAM: Vitals:   09/05/16 2300 09/05/16 2325 09/06/16 0325 09/06/16 0600  BP: (!) 112/58   (!) 100/55  Pulse: 73   68  Resp: (!) 22   (!) 22  Temp:  99.6 F (37.6 C) 99.9 F (37.7 C)   TempSrc:  Oral Axillary   SpO2: 97%   97%  Weight:      Height:       I/O's: U/O = 0.8 ml/kg/hr) NGT = 200 cc/ shift  Abdomen no significant bowel sounds. Lungs with good air exchange. Feet are warm and well perfused.  LABS: Lab Results  Component Value Date   WBC 18.4 (H) 09/06/2016   HGB 8.9 (L) 09/06/2016   HCT 28.0 (L) 09/06/2016   MCV 92.7 09/06/2016   PLT 146 (L) 09/06/2016   Lab Results  Component Value Date   CREATININE 1.91 (H) 09/06/2016   Lab Results  Component Value Date   INR 1.32 09/04/2016   CBG (last 3)   Recent Labs  09/05/16 1923 09/05/16 2326 09/06/16 0323  GLUCAP 98 86 78    Active Problems:   Ruptured abdominal aortic aneurysm (AAA) (HCC)   Acute hypoxemic respiratory failure (HCC)   AKI (acute kidney injury) (HCC)   Dysphagia   Elevated troponin   Coronary artery calcification seen on CAT scan  Cari CarawayChris Dickson Beeper: [phone] 09/06/2016

## 2016-09-07 ENCOUNTER — Inpatient Hospital Stay (HOSPITAL_COMMUNITY): Payer: Medicare Other

## 2016-09-07 LAB — BASIC METABOLIC PANEL
ANION GAP: 6 (ref 5–15)
Anion gap: 11 (ref 5–15)
BUN: 33 mg/dL — ABNORMAL HIGH (ref 6–20)
BUN: 33 mg/dL — AB (ref 6–20)
CALCIUM: 7.8 mg/dL — AB (ref 8.9–10.3)
CALCIUM: 7.7 mg/dL — AB (ref 8.9–10.3)
CO2: 28 mmol/L (ref 22–32)
CO2: 28 mmol/L (ref 22–32)
CREATININE: 1.81 mg/dL — AB (ref 0.61–1.24)
Chloride: 109 mmol/L (ref 101–111)
Chloride: 103 mmol/L (ref 101–111)
Creatinine, Ser: 1.86 mg/dL — ABNORMAL HIGH (ref 0.61–1.24)
GFR calc non Af Amer: 36 mL/min — ABNORMAL LOW (ref >60)
GFR, EST AFRICAN AMERICAN: 40 mL/min — AB (ref >60)
GFR, EST AFRICAN AMERICAN: 42 mL/min — AB (ref >60)
GFR, EST NON AFRICAN AMERICAN: 35 mL/min — AB (ref >60)
Glucose, Bld: 130 mg/dL — ABNORMAL HIGH (ref 65–99)
Glucose, Bld: 121 mg/dL — ABNORMAL HIGH (ref 65–99)
POTASSIUM: 3.6 mmol/L (ref 3.5–5.1)
Potassium: 3 mmol/L — ABNORMAL LOW (ref 3.5–5.1)
SODIUM: 142 mmol/L (ref 135–145)
Sodium: 143 mmol/L (ref 135–145)

## 2016-09-07 LAB — GLUCOSE, CAPILLARY
GLUCOSE-CAPILLARY: 122 mg/dL — AB (ref 65–99)
GLUCOSE-CAPILLARY: 130 mg/dL — AB (ref 65–99)
GLUCOSE-CAPILLARY: 115 mg/dL — AB (ref 65–99)
Glucose-Capillary: 105 mg/dL — ABNORMAL HIGH (ref 65–99)
Glucose-Capillary: 118 mg/dL — ABNORMAL HIGH (ref 65–99)
Glucose-Capillary: 138 mg/dL — ABNORMAL HIGH (ref 65–99)
Glucose-Capillary: 143 mg/dL — ABNORMAL HIGH (ref 65–99)

## 2016-09-07 LAB — CBC
HCT: 26.8 % — ABNORMAL LOW (ref 39.0–52.0)
Hemoglobin: 8.5 g/dL — ABNORMAL LOW (ref 13.0–17.0)
MCH: 29.2 pg (ref 26.0–34.0)
MCHC: 31.7 g/dL (ref 30.0–36.0)
MCV: 92.1 fL (ref 78.0–100.0)
Platelets: 134 10*3/uL — ABNORMAL LOW (ref 150–400)
RBC: 2.91 MIL/uL — AB (ref 4.22–5.81)
RDW: 16.3 % — AB (ref 11.5–15.5)
WBC: 13.3 10*3/uL — AB (ref 4.0–10.5)

## 2016-09-07 LAB — RAPID URINE DRUG SCREEN, HOSP PERFORMED
Amphetamines: NOT DETECTED
BENZODIAZEPINES: POSITIVE — AB
Barbiturates: NOT DETECTED
Cocaine: NOT DETECTED
Opiates: NOT DETECTED
Tetrahydrocannabinol: NOT DETECTED

## 2016-09-07 LAB — MAGNESIUM
MAGNESIUM: 1.8 mg/dL (ref 1.7–2.4)
MAGNESIUM: 1.7 mg/dL (ref 1.7–2.4)

## 2016-09-07 LAB — TROPONIN I

## 2016-09-07 LAB — PHOSPHORUS: PHOSPHORUS: 3.1 mg/dL (ref 2.5–4.6)

## 2016-09-07 MED ORDER — DOCUSATE SODIUM 50 MG/5ML PO LIQD
100.0000 mg | Freq: Every day | ORAL | Status: DC
  Administered 2016-09-07 – 2016-09-10 (×4): 100 mg via ORAL
  Filled 2016-09-07 (×4): qty 10

## 2016-09-07 MED ORDER — MAGNESIUM SULFATE 2 GM/50ML IV SOLN
2.0000 g | Freq: Once | INTRAVENOUS | Status: AC
  Administered 2016-09-07: 2 g via INTRAVENOUS
  Filled 2016-09-07: qty 50

## 2016-09-07 MED ORDER — FUROSEMIDE 10 MG/ML IJ SOLN
40.0000 mg | Freq: Two times a day (BID) | INTRAMUSCULAR | Status: AC
  Administered 2016-09-07: 40 mg via INTRAVENOUS
  Filled 2016-09-07: qty 4

## 2016-09-07 MED ORDER — POTASSIUM CHLORIDE 20 MEQ/15ML (10%) PO SOLN: 40.0000 meq | Freq: Once | ORAL | Status: DC

## 2016-09-07 MED ORDER — SODIUM CHLORIDE 0.9 % IV SOLN
30.0000 meq | Freq: Once | INTRAVENOUS | Status: AC
  Administered 2016-09-08: 30 meq via INTRAVENOUS
  Filled 2016-09-07: qty 15

## 2016-09-07 MED ORDER — POTASSIUM CHLORIDE 20 MEQ/15ML (10%) PO SOLN
20.0000 meq | Freq: Once | ORAL | Status: AC
  Administered 2016-09-07: 20 meq
  Filled 2016-09-07: qty 15

## 2016-09-07 MED ORDER — POTASSIUM CHLORIDE 2 MEQ/ML IV SOLN
30.0000 meq | Freq: Once | INTRAVENOUS | Status: AC
  Administered 2016-09-07: 30 meq via INTRAVENOUS
  Filled 2016-09-07: qty 15

## 2016-09-07 MED ORDER — DILTIAZEM HCL 100 MG IV SOLR
5.0000 mg/h | INTRAVENOUS | Status: DC
  Administered 2016-09-07 – 2016-09-08 (×2): 5 mg/h via INTRAVENOUS
  Filled 2016-09-07 (×2): qty 100

## 2016-09-07 NOTE — Progress Notes (Signed)
PULMONARY / CRITICAL CARE MEDICINE   Name: Michael Heath MRN: [id number] DOB: Jun 25, 1946    ADMISSION DATE:  08/30/2016 CONSULTATION DATE:  08/24/2016  REFERRING MD:  Harold Barban MD  CHIEF COMPLAINT:  Ruptured AAA aneurysm status post repair, acute hypoxic respiratory failure.  HISTORY OF PRESENT ILLNESS:   71 year old with past medical history of hypertension, hyperlipidemia, CKD, stage III. Admitted today with right flank, back pain. A CTA which showed ruptured AAA. He was transferred emergently to [facility] for stent repair. In the OR he received 16 units PRBC, 10 units FFP, 3 units platelets, 1 unit cryo and 1 Cell Saver. He developed abdominal comparment syndrome in OR from massive RP bleed. He is brought back to the ICU with an open abdomen. PCCM consulted for vent, sedation management.   SUBJECTIVE:  Weaned off pressors 2/17 . B/p stable this am  .  Still not following commands, limited responsiveness  Versed stopped this am . Weaning fentanyl -increased wob  Wife at bedside , updated.  Trickle feeds started 2/17   VITAL SIGNS: BP (!) 107/55   Pulse 92   Temp 99.1 F (37.3 C) (Oral)   Resp (!) 22   Ht _0  (1.854 m)   Wt 253 lb 1.4 oz (114.8 kg)   SpO2 94%   BMI 33.39 kg/m   HEMODYNAMICS:    VENTILATOR SETTINGS: Vent Mode: PRVC FiO2 (%):  [50 %-60 %] 50 % Set Rate:  [22 bmp] 22 bmp Vt Set:  [640 mL] 640 mL PEEP:  [10 cmH20-12 cmH20] 12 cmH20 Pressure Support:  [10 cmH20] 10 cmH20 Plateau Pressure:  [18 cmH20-23 cmH20] 23 cmH20  INTAKE / OUTPUT: I/O last 3 completed shifts: In: 3189.2 [I.V.:2209.2; NG/GT:330; IV Piggyback:650] Out: 2610 [Urine:2610]  PHYSICAL EXAMINATION: General:  Sedated, intubated, NAD.  Neuro: sedated, on vent , not f/c ,min response  HEENT:  ETT in place.  Cardiovascular:  Regular rate and rhythm, no murmurs rubs gallops Lungs: decreased BS in bases  Abdomen:  Closed abdomen. Diminish BS  Musculoskeletal:  Normal tone and  bulk Skin: Intact. Gr 2+ edema.  Warm extremities.   LABS:  BMET  Recent Labs Lab 09/05/16 0450 09/06/16 0430 09/07/16 0456  NA 142 143 143  K 3.8 3.7 3.6  CL 110 109 109  CO2 _1 BUN 24* 28* 33*  CREATININE 1.78* 1.91* 1.86*  GLUCOSE 109* 96 130*    Electrolytes  Recent Labs Lab 09/05/16 0450 09/06/16 0430 09/07/16 0456  CALCIUM 7.0* 7.6* 7.8*  MG 1.9 1.9 1.8  PHOS 2.8 3.1 3.1    CBC  Recent Labs Lab 09/05/16 0450 09/06/16 0430 09/07/16 0456  WBC 20.2* 18.4* 13.3*  HGB 9.3* 8.9* 8.5*  HCT 29.2* 28.0* 26.8*  PLT 137* 146* 134*    Coag's  Recent Labs Lab 09/14/2016 2007  09/16/2016 1630 09/11/2016 2055 09/04/16 1458  APTT 43*  --  29  --  28  INR 1.45  < > 1.22 1.41 1.32  < > = values in this interval not displayed.  Sepsis Markers  Recent Labs Lab 09/13/2016 0842 09/04/16 1459 09/04/16 2115  LATICACIDVEN 1.3 1.4 1.4    ABG  Recent Labs Lab 09/04/16 0416 09/04/16 1500 09/06/16 0430  PHART 7.478* 7.418 7.357  PCO2ART 42.1 47.4 48.0  PO2ART 94.0 59.0* 66.3*    Liver Enzymes  Recent Labs Lab 09/09/2016 1630 09/04/16 0200 09/04/16 1458  AST 36 28 29  ALT _2 ALKPHOS 33*  32* 38  BILITOT 1.2 1.9* 3.7*  ALBUMIN 2.1* 2.1* 2.0*    Cardiac Enzymes  Recent Labs Lab 09/01/2016 0842 08/24/2016 1200 09/10/2016 2055  TROPONINI 0.17* 0.16* 0.11*    Glucose  Recent Labs Lab 09/06/16 0726 09/06/16 1127 09/06/16 1507 09/06/16 1924 09/06/16 2331 09/07/16 0404  GLUCAP 99 83 96 111* 87 122*    Imaging Dg Chest Port 1 View  Result Date: 09/07/2016 CLINICAL DATA:  Respiratory failure EXAM: PORTABLE CHEST 1 VIEW COMPARISON:  Two days ago FINDINGS: Endotracheal tube tip at the clavicular heads. An orogastric tube reaches the stomach. Right IJ sheath, tip near the SVC origin. Hazy basilar density with more well-defined left lower lobe opacification and volume loss. No visible pneumothorax. No discrete Kerley lines. Normal heart  size. IMPRESSION: 1. Stable positioning of endotracheal and orogastric tubes. 2. Atelectasis and probable small effusions at the bases. Cannot exclude superimposed infection. Electronically Signed   By: Monte Fantasia M.D.   On: 09/07/2016 07:12     STUDIES:  CT Angio chest, abdomen 08/24/2016- ruptured 7.8 cm abdominal aortic aneurysm with a large RP hematoma. Moderate emphysema. No focal pneumonia, consolidation, opacities. CXR 09/01/16- COPD, no active cardio pulmonary disease All images personally reviewed. Echo 7/42 > N EF, diastolic dysfxn  CULTURES: MRSA 2/14 > (-) Sputum 2/14 >norm flora  Blood 2/14 >   ANTIBIOTICS: Cefuroxime 2/13> 2/14 Periop Vanc 2/15 > Zosyn 2/15 >    SIGNIFICANT EVENTS: 2/13- Admit, OR for ruptured AAA. 2/14- Back to OR for abdomina closure.    LINES/TUBES: ETT 2/13 Rt IJ cordis 2/13   DISCUSSION: Patient admitted  for acute AAA rupture and had endovascular repair of ruptured AAA and ex-lap for abdominal compartment syndrome. Abdomen was kept open after procedure then kept intubated post op. Patient with hypoxemia and resp failure  after receiveing significant amounts of blood products. Pt went back to OR on 2/14 for abdominal closure. Remains on the vent.    Acute Hypoxemic Respiratory Failure/ARDS 2/2  TRALI with significant amount of blood products transfusion + Pulm edema / volume overload + Demand ischemia + Possible HCAP LLL Started diuresis w/ lasix 50m daily on 2/16 . (+15L postive bal since admit)   -Increase Lasix 40 Twice daily x 2 doses 2/18  -I/O bal goal neg.  Continue on Vanc/Zosyn (Started 2/15 for persistent fevers/ possible HCAP- follow cx data  - Continue ARDS protocol. (On 50% FiO2 and PEEP 12. Keep o2 sats > 88%.  - Continue with sedation-Weaned off versed 2/18 , try to decrease Fentanyl, if unable may try precedex.   -Eval for daily SBT/Wean  -check cxr in am   Abdominal compartment syndrome. S/P ExLap 2/13 and S/P Abs  closure 2/14 -Per Surgery -NGT inserted by GI 2/2 h/o esophageal stricture.  Per GI, no stricture seen. Pt just has a large tongue in the way.  -Trickle TF ordered 2/17 per CCS    S/P Endovascular Repair of Ruptured AAA 2/13 with Closure of abdomen on 2/14  - Hb and Hct have been stable. Observe for now - no acitve bleeding right now  AKI - he is volume overloaded.  Started lasix 2/16.   Increase dose of lasix now that he is off pressors   -replace K +   Best Practice - on SCD, Lovenox SQ - on PPI for SUP  Tammy Parrett NP-C  [facility]  [phone]  09/07/2016

## 2016-09-07 NOTE — Progress Notes (Signed)
   VASCULAR SURGERY ASSESSMENT & PLAN:  5 Days Post-Op s/p: Endovascular repair of ruptured abdominal aortic aneurysm, evacuation of retroperitoneal hematoma, and placement of abdominal wound VAC. (POD 4 - closure of abd)  CARDIAC: Hemodynamically stable. Still on Neo.  PULMONARY: Vent wean per CCM  RENAL: No change in crt. (1.86 today)  NUTRITION: Tolerating trickle tube feeds.  ID: On vancomycin and Zosyn for pneumonia. White blood cell count is down today (13.3).  DVT PROPHYLAXIS: On Lovenox.  SUBJECTIVE: Sedated on vent.  PHYSICAL EXAM: Vitals:   09/07/16 0530 09/07/16 0600 09/07/16 0630 09/07/16 0700  BP:  (!) 88/51 120/62 (!) 93/57  Pulse: (!) 118 100 100 87  Resp: (!) 22 (!) 22 (!) 22 (!) 22  Temp:      TempSrc:      SpO2: 97% 96% 98% 96%  Weight:      Height:       Lungs with good air exchange. Abdomen: He does have some bowel sounds. Abdominal incision and groin incision on the left side looks fine Feet are warm and well perfused.  LABS: Lab Results  Component Value Date   WBC 13.3 (H) 09/07/2016   HGB 8.5 (L) 09/07/2016   HCT 26.8 (L) 09/07/2016   MCV 92.1 09/07/2016   PLT 134 (L) 09/07/2016   Lab Results  Component Value Date   CREATININE 1.86 (H) 09/07/2016   Lab Results  Component Value Date   INR 1.32 09/04/2016   CHEST X-RAY: His chest x-ray today shows some atelectasis and probable small effusions at the bases.  CBG (last 3)   Recent Labs  09/06/16 1924 09/06/16 2331 09/07/16 0404  GLUCAP 111* 87 122*    Active Problems:   Ruptured abdominal aortic aneurysm (AAA) (HCC)   Acute hypoxemic respiratory failure (HCC)   AKI (acute kidney injury) (HCC)   Dysphagia   Elevated troponin   Coronary artery calcification seen on CAT scan   Cari CarawayChris Shayle Donahoo Beeper: [phone] 09/07/2016

## 2016-09-07 NOTE — Progress Notes (Signed)
15 mL of Versed wasted down sink, witnessed by Ginnie SmartAbby Stophail RN.   Marlou PorchBradley Camri Molloy

## 2016-09-07 NOTE — Progress Notes (Signed)
On reassessment and weaning of sedation, patient found to have asymetrical grimace with painful core stimulus.  No movement to painful stimulus in the RUE and RLE.  Elink notified; verbal order given to turn off versed and manage patient sedation with fentanyl.  Will continue to monitor.  Ivery QualeSean Avriel Kandel RN

## 2016-09-07 NOTE — Progress Notes (Addendum)
45mL Versed WIS; expired medication.  Witnessed by Ernie HewJohn Terin Dierolf RN.   Ivery QualeSean Lee RN

## 2016-09-07 NOTE — Progress Notes (Signed)
eLink Physician-Brief Progress Note Patient Name: Michael AmenDavid T Minix DOB: July 15, 1946 MRN: [id number]   Date of Service  09/07/2016  HPI/Events of Note  K+ = 3.0, Mg++ = 1.7 and Creatinine = 1.81.Marland Kitchen.  eICU Interventions  Will replace K+ and Mg++.     Intervention Category Major Interventions: Electrolyte abnormality - evaluation and management  Sommer,Steven Eugene 09/07/2016, 10:20 PM

## 2016-09-07 NOTE — Progress Notes (Signed)
eLink Physician-Brief Progress Note Patient Name: Michael AmenDavid T Berrocal DOB: 18-Sep-1945 MRN: [id number]   Date of Service  09/07/2016  HPI/Events of Note  AFIB w/RVR - Ventricular rate = 152. BP = 143/89 w/MAP = 106.  eICU Interventions  Will order: 1. BMP and Mg++ level STAT. 2. Cardizem IV infusion.  3. Cycle Troponin.      Intervention Category Major Interventions: Arrhythmia - evaluation and management  Sommer,Steven Dennard Nipugene 09/07/2016, 9:17 PM

## 2016-09-07 NOTE — Progress Notes (Signed)
eLink Physician-Brief Progress Note Patient Name: Michael Heath DOB: [birth year] MRN: [id number]   Date of Service  09/07/2016  HPI/Events of Note  Nurse concerned about patient not waking up.   He is on a Versed drip as well as fentanyl drip.  He has acute on chronic kidney disease.    I told the nurse to discontinue the Versed drip.  I suggested to wean off the fentanyl drip as well.  May try a Precedex drip to facilitate weaning and.   eICU Interventions       Intervention Category Evaluation Type: Other  Jose Bridgette Habermannngelo A De Dios 09/07/2016, 5:03 AM

## 2016-09-08 ENCOUNTER — Inpatient Hospital Stay (HOSPITAL_COMMUNITY): Payer: Medicare Other

## 2016-09-08 LAB — COMPREHENSIVE METABOLIC PANEL
ALBUMIN: 1.5 g/dL — AB (ref 3.5–5.0)
ALT: 32 U/L (ref 17–63)
ANION GAP: 9 (ref 5–15)
AST: 85 U/L — AB (ref 15–41)
Alkaline Phosphatase: 42 U/L (ref 38–126)
BUN: 37 mg/dL — AB (ref 6–20)
CHLORIDE: 106 mmol/L (ref 101–111)
CO2: 29 mmol/L (ref 22–32)
Calcium: 7.8 mg/dL — ABNORMAL LOW (ref 8.9–10.3)
Creatinine, Ser: 1.84 mg/dL — ABNORMAL HIGH (ref 0.61–1.24)
GFR calc Af Amer: 41 mL/min — ABNORMAL LOW (ref >60)
GFR calc non Af Amer: 35 mL/min — ABNORMAL LOW (ref >60)
GLUCOSE: 142 mg/dL — AB (ref 65–99)
POTASSIUM: 3.6 mmol/L (ref 3.5–5.1)
SODIUM: 144 mmol/L (ref 135–145)
Total Bilirubin: 5.9 mg/dL — ABNORMAL HIGH (ref 0.3–1.2)
Total Protein: 5.3 g/dL — ABNORMAL LOW (ref 6.5–8.1)

## 2016-09-08 LAB — GLUCOSE, CAPILLARY
GLUCOSE-CAPILLARY: 132 mg/dL — AB (ref 65–99)
GLUCOSE-CAPILLARY: 118 mg/dL — AB (ref 65–99)
GLUCOSE-CAPILLARY: 144 mg/dL — AB (ref 65–99)
GLUCOSE-CAPILLARY: 145 mg/dL — AB (ref 65–99)
Glucose-Capillary: 134 mg/dL — ABNORMAL HIGH (ref 65–99)
Glucose-Capillary: 144 mg/dL — ABNORMAL HIGH (ref 65–99)

## 2016-09-08 LAB — CBC
HCT: 28.3 % — ABNORMAL LOW (ref 39.0–52.0)
HEMOGLOBIN: 9 g/dL — AB (ref 13.0–17.0)
MCH: 29.2 pg (ref 26.0–34.0)
MCHC: 31.8 g/dL (ref 30.0–36.0)
MCV: 91.9 fL (ref 78.0–100.0)
Platelets: 162 10*3/uL (ref 150–400)
RBC: 3.08 MIL/uL — AB (ref 4.22–5.81)
RDW: 16.4 % — ABNORMAL HIGH (ref 11.5–15.5)
WBC: 14.5 10*3/uL — AB (ref 4.0–10.5)

## 2016-09-08 LAB — MAGNESIUM: MAGNESIUM: 2.1 mg/dL (ref 1.7–2.4)

## 2016-09-08 LAB — AMMONIA: Ammonia: 23 umol/L (ref 9–35)

## 2016-09-08 LAB — LACTIC ACID, PLASMA: LACTIC ACID, VENOUS: 1.7 mmol/L (ref 0.5–1.9)

## 2016-09-08 LAB — PHOSPHORUS: PHOSPHORUS: 2.7 mg/dL (ref 2.5–4.6)

## 2016-09-08 LAB — TROPONIN I: Troponin I: <0.03 ng/mL (ref <0.03)

## 2016-09-08 MED ORDER — SODIUM CHLORIDE 0.9 % IV SOLN
0.0000 ug/h | INTRAVENOUS | Status: DC
  Administered 2016-09-08: 50 ug/h via INTRAVENOUS
  Administered 2016-09-09: 150 ug/h via INTRAVENOUS
  Administered 2016-09-10: 125 ug/h via INTRAVENOUS
  Filled 2016-09-08 (×3): qty 50

## 2016-09-08 MED ORDER — FENTANYL CITRATE (PF) 100 MCG/2ML IJ SOLN
25.0000 ug | INTRAMUSCULAR | Status: DC | PRN
  Administered 2016-09-08: 200 ug via INTRAVENOUS
  Filled 2016-09-08: qty 4

## 2016-09-08 MED ORDER — FENTANYL CITRATE (PF) 100 MCG/2ML IJ SOLN
25.0000 ug | INTRAMUSCULAR | Status: DC | PRN
Start: 2016-09-08 — End: 2016-09-12
  Administered 2016-09-11: 100 ug via INTRAVENOUS
  Administered 2016-09-11: 50 ug via INTRAVENOUS
  Administered 2016-09-11 – 2016-09-12 (×3): 100 ug via INTRAVENOUS
  Administered 2016-09-12: 200 ug via INTRAVENOUS
  Filled 2016-09-08: qty 2
  Filled 2016-09-08: qty 4
  Filled 2016-09-08 (×4): qty 2

## 2016-09-08 MED ORDER — FUROSEMIDE 10 MG/ML IJ SOLN
80.0000 mg | Freq: Three times a day (TID) | INTRAMUSCULAR | Status: DC
  Administered 2016-09-08 – 2016-09-12 (×13): 80 mg via INTRAVENOUS
  Filled 2016-09-08 (×13): qty 8

## 2016-09-08 NOTE — Progress Notes (Signed)
eLink Physician-Brief Progress Note Patient Name: Amil AmenDavid T Gentz DOB: Nov 21, 1945 MRN: [id number]   Date of Service  09/08/2016  HPI/Events of Note  Off sedation with Fentanyl >> now with ventilator dys-synchrony.   eICU Interventions  Will order: 1. Restart Fentanyl IV infusion. Titrate to RASS = 0 to -1. 2. Increase Fentanyl 25-200 mcg IV from Q 2 hours to Q 1 hour.      Intervention Category Minor Interventions: Agitation / anxiety - evaluation and management  Malijah Lietz Eugene 09/08/2016, 5:18 PM

## 2016-09-08 NOTE — Care Management Note (Signed)
Case Management Note  Patient Details  Name: Amil AmenDavid T Commerford MRN: [id number] Date of Birth: 06-04-1946  Subjective/Objective:    S/p AAA repair                  Action/Plan:   PTA from home with wife.   Expected Discharge Date:                  Expected Discharge Plan:     In-House Referral:  Clinical Social Work  Discharge planning Services  CM Consult  Post Acute Care Choice:    Choice offered to:     DME Arranged:    DME Agency:     HH Arranged:    HH Agency:     Status of Service:  In process, will continue to follow  If discussed at Long Length of Stay Meetings, dates discussed:    Additional Comments: 09/08/2016 Pt is now not moving right side of body , CT overnight negative for bleed - may need head MRI without contrast.  Pt remains intubated on Cardizem drip -  Will need PT eval post extubation Cherylann ParrClaxton, [place] Kenley S, RN 09/08/2016, 10:24 AM

## 2016-09-08 NOTE — Progress Notes (Signed)
PULMONARY / CRITICAL CARE MEDICINE   Name: Michael AmenDavid T Veale MRN: [id number] DOB: 12/28/1945    ADMISSION DATE:  08/21/2016 CONSULTATION DATE:  08/31/2016  REFERRING MD:  Coral ElseVance Brabham MD  CHIEF COMPLAINT:  Ruptured AAA aneurysm status post repair, acute hypoxic respiratory failure.  brief 71 year old with past medical history of hypertension, hyperlipidemia, CKD, stage III. Admitted today with right flank, back pain. A CTA which showed ruptured AAA. [facility]. In the OR he received 16 units PRBC, 10 units FFP, 3 units platelets, 1 unit cryo and 1 Cell Saver. He developed abdominal comparment syndrome in OR from massive RP bleed. He is brought back to the ICU with an open abdomen. PCCM consulted for vent, sedation management.   LINES/TUBES: ETT 2/13 Rt IJ cordis 2/13  SIGNIFICANT EVENTS: 2/13- Admit, OR for ruptured AAA. 2/14- Back to OR for abdomina closure.  2/18 -   Weaned off pressors 2/17 . B/p stable this am  .  Still not following commands, limited responsiveness  Versed stopped this am . Weaning fentanyl -increased wob  Wife at bedside , updated.  Trickle feeds started 2/17   SUBJECTIVE/OVERNIGHT/INTERVAL HX 2./19 - RUE weakness overnight; CT head negative. Off pressors. Volume overloaded. Wife and grandson at bedside. RASS -3 off sedation gtt x 1h  VITAL SIGNS: BP (!) 145/69   Pulse 91   Temp 99.9 F (37.7 C) (Axillary)   Resp (!) 21   Ht 6\' 1"  (1.854 m)   Wt 117.5 kg (259 lb 0.7 oz)   SpO2 94%   BMI 34.18 kg/m   HEMODYNAMICS:    VENTILATOR SETTINGS: Vent Mode: PRVC FiO2 (%):  [40 %-60 %] 60 % Set Rate:  [22 bmp] 22 bmp Vt Set:  [640 mL] 640 mL PEEP:  [10 cmH20] 10 cmH20 Plateau Pressure:  [20 cmH20-23 cmH20] 21 cmH20  INTAKE / OUTPUT: I/O last 3 completed shifts: In: 2540.8 [I.V.:875.8; NG/GT:900; IV Piggyback:765] Out: 3901 [Urine:3900; Stool:1]  PHYSICAL EXAMINATION: General" Critically ill  looking Neuro: RASS -3 off sedatin gtt x 1h. Not moving any limbs Resp: CTA bilaterally. Sync with vent. Peep 10, fio2 60% CVS: Normal heart sounds ABD: obese, soft EXT: Edema +++ Skin: intact on exposed areas   LABS:   PULMONARY  Recent Labs Lab 09/05/2016 2042 09/15/2016 2146 09/11/2016 2339 09/04/16 0416 09/04/16 1500 09/06/16 0430  PHART 7.392 7.465* 7.385 7.478* 7.418 7.357  PCO2ART 52.2* 41.2 49.4* 42.1 47.4 48.0  PO2ART 59.0* 54.0* 81.0* 94.0 59.0* 66.3*  HCO3 31.7* 29.5* 29.2* 31.0* 30.6* 26.0  TCO2 33 31 31 32 32  --   O2SAT 89.0 89.0 95.0 98.0 90.0 90.9    CBC  Recent Labs Lab 09/06/16 0430 09/07/16 0456 09/08/16 0318  HGB 8.9* 8.5* 9.0*  HCT 28.0* 26.8* 28.3*  WBC 18.4* 13.3* 14.5*  PLT 146* 134* 162    COAGULATION  Recent Labs Lab 08/29/2016 2007 08/31/2016 0842 08/24/2016 1630 09/10/2016 2055 09/04/16 1458  INR 1.45 1.39 1.22 1.41 1.32    CARDIAC   Recent Labs Lab 08/25/2016 0842 09/01/2016 1200 09/01/2016 2055 09/07/16 2128 09/08/16 0318  TROPONINI 0.17* 0.16* 0.11* <0.03 <0.03   No results for input(s): PROBNP in the last 168 hours.   CHEMISTRY  Recent Labs Lab 09/04/16 0200  09/05/16 0450 09/06/16 0430 09/07/16 0456 09/07/16 2128 09/08/16 0318  NA 144  < > 142 143 143 142 144  K 4.0  < > 3.8 3.7 3.6 3.0* 3.6  CL 111  < > 110 109 109 103 106  CO2 27  < > 26 25 28 28 29   GLUCOSE 130*  < > 109* 96 130* 121* 142*  BUN 22*  < > 24* 28* 33* 33* 37*  CREATININE 1.78*  < > 1.78* 1.91* 1.86* 1.81* 1.84*  CALCIUM 6.7*  < > 7.0* 7.6* 7.8* 7.7* 7.8*  MG 1.5*  --  1.9 1.9 1.8 1.7 2.1  PHOS 2.9  --  2.8 3.1 3.1  --  2.7  < > = values in this interval not displayed. Estimated Creatinine Clearance: 49.4 mL/min (by C-G formula based on SCr of 1.84 mg/dL (H)).   LIVER  Recent Labs Lab 09/02/2016 2007 09/02/2016 0345 09/13/2016 0842 08/28/2016 1630 09/03/16 2055 09/04/16 0200 09/04/16 1458 09/08/16 0318  AST  --  45*  --  36  --  28 29 85*   ALT  --  29  --  22  --  17 18 32  ALKPHOS  --  35*  --  33*  --  32* 38 42  BILITOT  --  1.6*  --  1.2  --  1.9* 3.7* 5.9*  PROT  --  4.6*  --  4.1*  --  4.1* 4.9* 5.3*  ALBUMIN  --  2.6*  --  2.1*  --  2.1* 2.0* 1.5*  INR 1.45  --  1.39 1.22 1.41  --  1.32  --      INFECTIOUS  Recent Labs Lab 09/04/16 1459 09/04/16 2115 09/08/16 0320  LATICACIDVEN 1.4 1.4 1.7     ENDOCRINE CBG (last 3)   Recent Labs  09/07/16 2354 09/08/16 0342 09/08/16 0726  GLUCAP 143* 132* 118*         IMAGING x48h  - image(s) personally visualized  -   highlighted in bold Ct Head Wo Contrast  Result Date: 09/07/2016 CLINICAL DATA:  71 year old male with acute encephalopathy. EXAM: CT HEAD WITHOUT CONTRAST TECHNIQUE: Contiguous axial images were obtained from the base of the skull through the vertex without intravenous contrast. COMPARISON:  Brain MRI dated 08/25/2016 FINDINGS: Brain: The ventricles and sulci appropriate in size for patient's age. Mild to moderate periventricular and deep white matter chronic microvascular ischemic changes primarily involving the left frontoparietal centrum semiovale. There is no acute intracranial hemorrhage. No mass effect or midline shift noted. No extra-axial fluid collection. Vascular: No hyperdense vessel or unexpected calcification. Skull: Normal. Negative for fracture or focal lesion. Sinuses/Orbits: There is diffuse mucoperiosteal thickening of paranasal sinuses with near complete opacification of the right maxillary sinus partial opacification of the ethmoid air cells and frontal sinuses. No air-fluid levels. There is also diffuse mucoperiosteal thickening of the mastoid air cells, right greater than left. Other: Partially calcified ovoid soft tissue density in the subcutaneous soft tissues of the left occiput. IMPRESSION: 1. No acute intracranial hemorrhage. 2. Age-related atrophy and chronic microvascular ischemic changes. If symptoms persist, and there are  no contraindications, MRI may provide better evaluation if clinically indicated. Electronically Signed   By: Elgie Collard M.D.   On: 09/07/2016 22:01   Dg Chest Port 1 View  Result Date: 09/08/2016 CLINICAL DATA:  Followup ventilator support. EXAM: PORTABLE CHEST 1 VIEW COMPARISON:  09/07/2016 FINDINGS: Endotracheal tube tip is 4 cm above the carina. Nasogastric tube enters the abdomen. Right internal jugular venous access sheath remains in place. Persistent bilateral effusions with lower lobe volume loss, left more than right. Persistent fluid overload/ edema. IMPRESSION: No change since  yesterday. Lines and tubes well positioned. Persistent effusions, dependent atelectasis and edema. Electronically Signed   By: Paulina Fusi M.D.   On: 09/08/2016 07:11   Dg Chest Port 1 View  Result Date: 09/07/2016 CLINICAL DATA:  Respiratory failure EXAM: PORTABLE CHEST 1 VIEW COMPARISON:  Two days ago FINDINGS: Endotracheal tube tip at the clavicular heads. An orogastric tube reaches the stomach. Right IJ sheath, tip near the SVC origin. Hazy basilar density with more well-defined left lower lobe opacification and volume loss. No visible pneumothorax. No discrete Kerley lines. Normal heart size. IMPRESSION: 1. Stable positioning of endotracheal and orogastric tubes. 2. Atelectasis and probable small effusions at the bases. Cannot exclude superimposed infection. Electronically Signed   By: Marnee Spring M.D.   On: 09/07/2016 07:12     STUDIES:  CT Angio chest, abdomen 08/21/2016- ruptured 7.8 cm abdominal aortic aneurysm with a large RP hematoma. Moderate emphysema. No focal pneumonia, consolidation, opacities. CXR 09/01/16- COPD, no active cardio pulmonary disease All images personally reviewed. Echo 2/15 > N EF, diastolic dysfxn  CULTURES: MRSA 2/14 > (-) Sputum 2/14 >norm flora  Blood 2/14 >   ANTIBIOTICS: Cefuroxime 2/13> 2/14 Periop Vanc 2/15 > Zosyn 2/15 >    DISCUSSION: Patient admitted   for acute AAA rupture and had endovascular repair of ruptured AAA and ex-lap for abdominal compartment syndrome. Abdomen was kept open after procedure then kept intubated post op. Patient with hypoxemia and resp failure  after receiveing significant amounts of blood products. Pt went back to OR on 2/14 for abdominal closure. Remains on the vent.   ASSESSMENT / PLAN:  PULMONARY A: Acute resp failure   2/19 - high peep and fio2 prevents sbt/extubation P:   Full vent support  CARDIOVASCULAR A:  S/p endovascular rrepair of ruptured AAA 2/13 and abd closure 2/14  2/19  - off pressors. Had A Fib RVR but now resolved P:  Dc cardizem  RENAL A:   Volume overloaded  P:   Diureses aggresively  GASTROINTESTINAL A:   On TF P:   Continue tf Continue ppi  HEMATOLOGIC A:   Anemia of critical illness P:  - PRBC for hgb </= 6.9gm%    - exceptions are   -  if ACS susepcted/confirmed then transfuse for hgb </= 8.0gm%,  or    - active bleeding with hemodynamic instability, then transfuse regardless of hemoglobin value   At at all times try to transfuse 1 unit prbc as possible with exception of active hemorrhage    INFECTIOUS A:   Possible HCAP P:   Anti-infectives    Start     Dose/Rate Route Frequency Ordered Stop   09/04/16 1600  vancomycin (VANCOCIN) IVPB 750 mg/150 ml premix     750 mg 150 mL/hr over 60 Minutes Intravenous Every 12 hours 09/04/16 1451     09/04/16 1500  piperacillin-tazobactam (ZOSYN) IVPB 3.375 g     3.375 g 12.5 mL/hr over 240 Minutes Intravenous Every 8 hours 09/04/16 1451     2016/09/15 1900  cefUROXime (ZINACEF) 1.5 g in dextrose 5 % 50 mL IVPB     1.5 g 100 mL/hr over 30 Minutes Intravenous  Once September 15, 2016 1849     08/29/2016 2015  cefUROXime (ZINACEF) 1.5 g in dextrose 5 % 50 mL IVPB     1.5 g 100 mL/hr over 30 Minutes Intravenous Every 12 hours 09/10/2016 2006 2016/09/15 1013       ENDOCRINE A:   hyperglycemia P:   icu  hyperglycemia  protocol  NEUROLOGIC A:   RASS -3 off fent gtt x 1 h - 2/19 New onset RUE weakness 2/18; CT h ead negative P:   RASS goal: 0 Dc sedation gtt Change to fent prn Dependinn on WUA - will consider MRI   FAMILY  - Updates: wife and grandson updated 09/08/2016   - Inter-disciplinary family meet or Palliative Care meeting due by day 7 which is 09/09/16     The patient is critically ill with multiple organ systems failure and requires high complexity decision making for assessment and support, frequent evaluation and titration of therapies, application of advanced monitoring technologies and extensive interpretation of multiple databases.   Critical Care Time devoted to patient care services described in this note is  30  Minutes. This time reflects time of care of this signee Dr Kalman Shan. This critical care time does not reflect procedure time, or teaching time or supervisory time of PA/NP/Med student/Med Resident etc but could involve care discussion time    Dr. Kalman Shan, M.D., [facility].C.P Pulmonary and Critical Care Medicine Staff Physician [facility] Pager: [phone], If no answer or between  15:00h - 7:00h: call [phone]  09/08/2016 9:22 AM

## 2016-09-08 NOTE — Progress Notes (Addendum)
AAA Progress Note    09/08/2016 8:21 AM 5 Days Post-Op  Subjective:  Intubated; RN states that pt went in to Afib last evening in CT; not moving right arm, but was not moving anything prior to yesterday as he was sedated.  CT scan okay.  Tm 100.7 now 99.9 HR  80's-140's (Afib from 2200-0000) otherwise NSR 80's-130's systolic  Gtts: Diltiazem 5mg /hr  Vitals:   09/08/16 0722 09/08/16 0730  BP: 122/60   Pulse: 81   Resp: (!) 22   Temp:  99.9 F (37.7 C)    Physical Exam: Cardiac:  regular Lungs:  Decreased BS at bases Abdomen:  Mildly distended but soft; +BS; +BM last night Incisions:  Laparotomy incision healing nicely; some serous drainage from left groin wound Extremities:  Easily palpable DP pulses bilateral DP Neuro:  Follows commands, however, does not move his right hand/arm. Wiggles toes on both feet.  When asked to squeeze left hand, he pumps it several times.  CBC    Component Value Date/Time   WBC 14.5 (H) 09/08/2016 0318   RBC 3.08 (L) 09/08/2016 0318   HGB 9.0 (L) 09/08/2016 0318   HCT 28.3 (L) 09/08/2016 0318   HCT 47.6 05/01/2016 1001   PLT 162 09/08/2016 0318   PLT 230 05/01/2016 1001   MCV 91.9 09/08/2016 0318   MCV 93 05/01/2016 1001   MCH 29.2 09/08/2016 0318   MCHC 31.8 09/08/2016 0318   RDW 16.4 (H) 09/08/2016 0318   RDW 13.0 05/01/2016 1001   LYMPHSABS 3.1 08/30/2016 1459   LYMPHSABS 2.6 05/01/2016 1001   MONOABS 2.7 (H) 08/29/2016 1459   EOSABS 0 09/07/2016 1459   EOSABS 0.6 (H) 05/01/2016 1001   BASOSABS 0 08/21/2016 1459   BASOSABS 0.1 05/01/2016 1001    BMET    Component Value Date/Time   NA 144 09/08/2016 0318   NA 134 08/25/2016 1335   K 3.6 09/08/2016 0318   CL 106 09/08/2016 0318   CO2 29 09/08/2016 0318   GLUCOSE 142 (H) 09/08/2016 0318   BUN 37 (H) 09/08/2016 0318   BUN 26 08/25/2016 1335   CREATININE 1.84 (H) 09/08/2016 0318   CALCIUM 7.8 (L) 09/08/2016 0318   GFRNONAA 35 (L) 09/08/2016 0318   GFRAA 41 (L)  09/08/2016 0318    INR    Component Value Date/Time   INR 1.32 09/04/2016 1458     Intake/Output Summary (Last 24 hours) at 09/08/16 0821 Last data filed at 09/08/16 0700  Gross per 24 hour  Intake          1778.01 ml  Output             3251 ml  Net         -1472.99 ml   Head CT 09/07/16: IMPRESSION: 1. No acute intracranial hemorrhage. 2. Age-related atrophy and chronic microvascular ischemic changes. If symptoms persist, and there are no contraindications, MRI may provide better evaluation if clinically indicated.  Blood Cx 09/04/16: No growth x 3 days Respiratory Cx 09/13/2016: Normal flora   Assessment/Plan:  [age] y.o. 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 Pulmonary:  Still on the vent-wean per CCM Neuro:  Prior to today, pt sedated and not moving extremities--today he wiggles toes on command and squeezes left hand; no movement right hand.  Will order echo.  CT head last evening revealed no acute intracranial hemorrhage GI:  Tolerating TF's @ 20; IV Protonix bid; +BM last night Renal:  Creatinine is 1.84 and stable with good UOP ID:  WBC up slightly to 14.5k from 13.3 with Tm of 100.7 now 99.9.  Pt is on vanc/zosyn.  Blood cx with no growth x 3 days and respiratory cx with normal flora.  Some serous drainage from left groin but does not appear purulent.  Continue to monitor.  DVT prophylaxis:  SCD's and Lovenox  [name], PA-C [facility] [phone] 09/08/2016 8:21 AM  I have interviewed the patient and examined the patient. I agree with the findings by the PA. He has had 2 BM's. Increase TF's as tolerated.  Hopefull start ween soon.  CT of head last PM was unremarkable. Assess neuro status when off sedation.   Cari Caraway, MD [phone]

## 2016-09-08 NOTE — Progress Notes (Signed)
    RUE weak despite sedatino off  Plan Mri brain and c spine wo contrast   Dr. Kalman ShanMurali Ziyon Cedotal, M.D., [facility] Pulmonary and Critical Care Medicine Staff Physician [facility] Pager: [phone], If no answer or between  15:00h - 7:00h: call [phone]  09/08/2016 2:02 PM

## 2016-09-08 NOTE — Plan of Care (Signed)
Problem: Fluid Volume: Goal: Ability to maintain a balanced intake and output will improve Outcome: Not Progressing Tolerating diuresis with furosemide.  Not yet at goal.

## 2016-09-08 NOTE — Progress Notes (Addendum)
Wasted 225 mL Fentanyl gtt flushed down sink.  Verified by 2 RNs - Wilhelmina McardleLauren Kennedy, RN and Deneise LeverPatricia Rudie Rikard, RN.

## 2016-09-09 ENCOUNTER — Inpatient Hospital Stay (HOSPITAL_COMMUNITY): Payer: Medicare Other

## 2016-09-09 DIAGNOSIS — I638 Other cerebral infarction: Secondary | ICD-10-CM

## 2016-09-09 DIAGNOSIS — G8191 Hemiplegia, unspecified affecting right dominant side: Secondary | ICD-10-CM

## 2016-09-09 DIAGNOSIS — M7989 Other specified soft tissue disorders: Secondary | ICD-10-CM

## 2016-09-09 LAB — BASIC METABOLIC PANEL
Anion gap: 8 (ref 5–15)
Anion gap: 10 (ref 5–15)
BUN: 41 mg/dL — AB (ref 6–20)
BUN: 46 mg/dL — AB (ref 6–20)
CALCIUM: 7.4 mg/dL — AB (ref 8.9–10.3)
CHLORIDE: 102 mmol/L (ref 101–111)
CO2: 31 mmol/L (ref 22–32)
CO2: 33 mmol/L — AB (ref 22–32)
CREATININE: 1.98 mg/dL — AB (ref 0.61–1.24)
Calcium: 8 mg/dL — ABNORMAL LOW (ref 8.9–10.3)
Chloride: 103 mmol/L (ref 101–111)
Creatinine, Ser: 1.88 mg/dL — ABNORMAL HIGH (ref 0.61–1.24)
GFR calc Af Amer: 40 mL/min — ABNORMAL LOW (ref >60)
GFR calc non Af Amer: 32 mL/min — ABNORMAL LOW (ref >60)
GFR, EST AFRICAN AMERICAN: 37 mL/min — AB (ref >60)
GFR, EST NON AFRICAN AMERICAN: 34 mL/min — AB (ref >60)
GLUCOSE: 130 mg/dL — AB (ref 65–99)
Glucose, Bld: 140 mg/dL — ABNORMAL HIGH (ref 65–99)
Potassium: 2.8 mmol/L — ABNORMAL LOW (ref 3.5–5.1)
Potassium: 3.4 mmol/L — ABNORMAL LOW (ref 3.5–5.1)
SODIUM: 145 mmol/L (ref 135–145)
Sodium: 142 mmol/L (ref 135–145)

## 2016-09-09 LAB — GLUCOSE, CAPILLARY
GLUCOSE-CAPILLARY: 143 mg/dL — AB (ref 65–99)
GLUCOSE-CAPILLARY: 137 mg/dL — AB (ref 65–99)
GLUCOSE-CAPILLARY: 123 mg/dL — AB (ref 65–99)
Glucose-Capillary: 123 mg/dL — ABNORMAL HIGH (ref 65–99)
Glucose-Capillary: 128 mg/dL — ABNORMAL HIGH (ref 65–99)
Glucose-Capillary: 50 mg/dL — ABNORMAL LOW (ref 65–99)

## 2016-09-09 LAB — HEPATIC FUNCTION PANEL
ALT: 35 U/L (ref 17–63)
AST: 82 U/L — ABNORMAL HIGH (ref 15–41)
Albumin: 1.4 g/dL — ABNORMAL LOW (ref 3.5–5.0)
Alkaline Phosphatase: 50 U/L (ref 38–126)
BILIRUBIN DIRECT: 2.6 mg/dL — AB (ref 0.1–0.5)
BILIRUBIN INDIRECT: 2.1 mg/dL — AB (ref 0.3–0.9)
BILIRUBIN TOTAL: 4.7 mg/dL — AB (ref 0.3–1.2)
Total Protein: 5.4 g/dL — ABNORMAL LOW (ref 6.5–8.1)

## 2016-09-09 LAB — CBC WITH DIFFERENTIAL/PLATELET
BASOS ABS: 0 10*3/uL (ref 0.0–0.1)
Basophils Relative: 0 %
EOS ABS: 0 10*3/uL (ref 0.0–0.7)
EOS PCT: 0 %
HCT: 27.1 % — ABNORMAL LOW (ref 39.0–52.0)
HEMOGLOBIN: 8.7 g/dL — AB (ref 13.0–17.0)
Lymphocytes Relative: 6 %
Lymphs Abs: 0.8 10*3/uL (ref 0.7–4.0)
MCH: 29.8 pg (ref 26.0–34.0)
MCHC: 32.1 g/dL (ref 30.0–36.0)
MCV: 92.8 fL (ref 78.0–100.0)
MONO ABS: 2 10*3/uL — AB (ref 0.1–1.0)
Monocytes Relative: 15 %
NEUTROS PCT: 79 %
Neutro Abs: 10.6 10*3/uL — ABNORMAL HIGH (ref 1.7–7.7)
PLATELETS: 186 10*3/uL (ref 150–400)
RBC: 2.92 MIL/uL — AB (ref 4.22–5.81)
RDW: 16.9 % — AB (ref 11.5–15.5)
WBC: 13.4 10*3/uL — AB (ref 4.0–10.5)

## 2016-09-09 LAB — CULTURE, BLOOD (ROUTINE X 2)
Culture: NO GROWTH
Culture: NO GROWTH

## 2016-09-09 LAB — MAGNESIUM: MAGNESIUM: 1.8 mg/dL (ref 1.7–2.4)

## 2016-09-09 LAB — TROPONIN I: Troponin I: <0.03 ng/mL (ref <0.03)

## 2016-09-09 LAB — PHOSPHORUS: PHOSPHORUS: 2.9 mg/dL (ref 2.5–4.6)

## 2016-09-09 MED ORDER — SODIUM CHLORIDE 0.9% FLUSH
10.0000 mL | INTRAVENOUS | Status: DC | PRN
  Administered 2016-09-24: 10 mL
  Filled 2016-09-09: qty 40

## 2016-09-09 MED ORDER — PROPOFOL 1000 MG/100ML IV EMUL: 5.0000 ug/kg/min | INTRAVENOUS | Status: DC

## 2016-09-09 MED ORDER — SODIUM CHLORIDE 0.9% FLUSH
10.0000 mL | Freq: Two times a day (BID) | INTRAVENOUS | Status: DC
  Administered 2016-09-09 – 2016-09-24 (×15): 10 mL

## 2016-09-09 MED ORDER — POTASSIUM CHLORIDE 20 MEQ/15ML (10%) PO SOLN
40.0000 meq | Freq: Once | ORAL | Status: AC
Start: 2016-09-09 — End: 2016-09-09
  Administered 2016-09-09: 40 meq
  Filled 2016-09-09: qty 30

## 2016-09-09 MED ORDER — DILTIAZEM HCL 100 MG IV SOLR
5.0000 mg/h | INTRAVENOUS | Status: DC
  Administered 2016-09-09: 5 mg/h via INTRAVENOUS
  Filled 2016-09-09 (×2): qty 100

## 2016-09-09 MED ORDER — CHLORHEXIDINE GLUCONATE CLOTH 2 % EX PADS
6.0000 | MEDICATED_PAD | Freq: Every day | CUTANEOUS | Status: DC
Start: 2016-09-09 — End: 2016-09-24
  Administered 2016-09-10 – 2016-09-23 (×11): 6 via TOPICAL

## 2016-09-09 MED ORDER — MIDAZOLAM HCL 2 MG/2ML IJ SOLN
1.0000 mg | INTRAMUSCULAR | Status: DC | PRN
  Administered 2016-09-13 (×2): 2 mg via INTRAVENOUS
  Filled 2016-09-09: qty 4

## 2016-09-09 MED ORDER — SODIUM CHLORIDE 0.9 % IV SOLN
Freq: Once | INTRAVENOUS | Status: AC
  Administered 2016-09-09: 06:00:00 via INTRAVENOUS
  Filled 2016-09-09: qty 1000

## 2016-09-09 MED ORDER — MAGNESIUM SULFATE 2 GM/50ML IV SOLN
2.0000 g | Freq: Once | INTRAVENOUS | Status: AC
  Administered 2016-09-09: 2 g via INTRAVENOUS
  Filled 2016-09-09: qty 50

## 2016-09-09 NOTE — Progress Notes (Signed)
AAA Progress Note    09/09/2016 7:31 AM 6 Days Post-Op  Subjective:  On the vent-awakes and follows commands  Tm 100.8 most recent 99.6 HR 90's-110's NSR/ST 110's-150's systolic (110's-120's since 9pm) 97% .[id number]  Gtts:   Fentanyl  ABx: Vanc Zosyn  Vitals:   09/09/16 0600 09/09/16 0700  BP: 118/70 119/65  Pulse: 94 95  Resp: (!) 22 (!) 23  Temp:      Physical Exam: Cardiac:  regular Lungs:  Decreased BS right base Abdomen:  More firm than yesterday; occasional bowel sounds; +BM last night at 7pm Incisions:  Midline incision is clean and dry-healing nicely.   Extremities:  Bilateral feet are warm with easily palpable DP pulses bilaterally  CBC    Component Value Date/Time   WBC 13.4 (H) 09/09/2016 0501   RBC 2.92 (L) 09/09/2016 0501   HGB 8.7 (L) 09/09/2016 0501   HCT 27.1 (L) 09/09/2016 0501   HCT 47.6 05/01/2016 1001   PLT 186 09/09/2016 0501   PLT 230 05/01/2016 1001   MCV 92.8 09/09/2016 0501   MCV 93 05/01/2016 1001   MCH 29.8 09/09/2016 0501   MCHC 32.1 09/09/2016 0501   RDW 16.9 (H) 09/09/2016 0501   RDW 13.0 05/01/2016 1001   LYMPHSABS 0.8 09/09/2016 0501   LYMPHSABS 2.6 05/01/2016 1001   MONOABS 2.0 (H) 09/09/2016 0501   EOSABS 0.0 09/09/2016 0501   EOSABS 0.6 (H) 05/01/2016 1001   BASOSABS 0.0 09/09/2016 0501   BASOSABS 0.1 05/01/2016 1001    BMET    Component Value Date/Time   NA 142 09/09/2016 0501   NA 134 08/25/2016 1335   K 2.8 (L) 09/09/2016 0501   CL 103 09/09/2016 0501   CO2 31 09/09/2016 0501   GLUCOSE 130 (H) 09/09/2016 0501   BUN 41 (H) 09/09/2016 0501   BUN 26 08/25/2016 1335   CREATININE 1.88 (H) 09/09/2016 0501   CALCIUM 7.4 (L) 09/09/2016 0501   GFRNONAA 34 (L) 09/09/2016 0501   GFRAA 40 (L) 09/09/2016 0501    INR    Component Value Date/Time   INR 1.32 09/04/2016 1458     Intake/Output Summary (Last 24 hours) at 09/09/16 0731 Last data filed at 09/09/16 0700  Gross per 24 hour  Intake          1810.38  ml  Output             5805 ml  Net         -3994.62 ml   MRI Head 09/09/16: IMPRESSION: 1. Multifocal acute ischemia, including the left frontal watershed region, possibly secondary to an acute hypotensive episode. The other scattered punctate foci of ischemia are more suggestive of a central cardio embolic process. 2. No acute hemorrhage or mass effect.  MRI Cervical Spine 09/09/16: IMPRESSION: 1. 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 2. Mild narrowing at the right C4 and left C5 neural foramina.   Assessment/Plan:  71 y.o. 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  No further Afib. Pulmonary:  FiO2 requirements increased overnight.  Continue management per CCM Neuro:  Awake on vent; follows commands; moves all extremities except right arm.  MRI overnight reveals multifocal acute ischemia including the left frontal watershed region possibly from acute hypotension and other scattered punctate foci of ischemia, which are more suggestive of a central cardio embolic process.  Complete 2D echo ordered yesterday. GI:  TF at 20/hr; continue IV protonix;  Pt had +BM last night at 7pm Renal:  Creatinine continues to be stable at 1.8 with good UOP.  Hypokalemia supplemented this am ID:  Pt with tm 100.8 with most recent 99.6.  Blood Cx x 2 no growth x 4 days.  WBC down today.  Continue Vanc/Zosyn.  Serous drainage from left groin significantly better today. DVT prophylaxis:   Lovenox/SCD's   [name], PA-C [facility] [phone] 09/09/2016 7:31 AM

## 2016-09-09 NOTE — Progress Notes (Signed)
Mannam MD paged regarding patient's morning Potassium of 2.8 and Calcium of 7.4. Received orders to replete. Will do so.   Marlou PorchBradley Jelani Trueba

## 2016-09-09 NOTE — Consult Note (Signed)
Neurology Consult Note  Reason for Consultation: Stroke  Requesting provider: Brand Males, MD  CC: Unable to provide as he is intubated and sedated  HPI: This is a 53-yo man who initially presented to [facility] ED on 09/14/2016 for evaluation of flank pain. CT of the abdomen was obtained and showed evidence of a ruptured AAA. He was transferred to [facility] for operative repair. He was taken to the OR the same day for graft repair of his AAA. The surgery was complicated by retroperitoneal bleeding resulting in abdominal compartment syndrome. During the surgery he received 16 units PRBCs, 10 units FFP, 3 units of platelets, 1 unit of cryo, and 1 Cell Saver. He was returned to the ICU with an open abdomen where he has remained on the ventilator. He initially required pressor support to maintain MAP above 65. He was initially hypoxic and placed on the ARDS protocol. He was taken back to the OR on 09/12/2016 for abdominal washout and closure of his abdominal wall. On 2/18, his nurse noticed that when his sedation was weaned he had an asymmetric grimace with no movement of the right side with painful stimulation. He was able to be weaned off of pressors but would not consistently follow commands. Family also related concern about limited movement of his right arm compared to his left. This prompted MRI scan of the brain for further evaluation. This was completed this morning and revealed evidence of acute infarction. Neurology consultation is requested for further recommendations.  PMH:  1. Hyperlipidemia 2. Vitamin D deficiency 3. Hypertension 4. Chronic kidney disease stage III 5. Abdominal aortic aneurysm status post rupture 09/15/2016   PSH:  Past Surgical History:  Procedure Laterality Date  . ABDOMINAL AORTIC ENDOVASCULAR STENT GRAFT N/A 09/01/2016   Procedure: ABDOMINAL AORTIC ENDOVASCULAR STENT GRAFT;  Surgeon: Serafina Mitchell, MD;  Location: [place];  Service: Vascular;   Laterality: N/A;  . ABDOMINAL EXPOSURE  08/27/2016   Procedure: Abdominal Exploration;  Surgeon: Serafina Mitchell, MD;  Location: [place];  Service: Vascular;;  . APPLICATION OF WOUND VAC  08/26/2016   Procedure: APPLICATION OF WOUND VAC;  Surgeon: Serafina Mitchell, MD;  Location: [place];  Service: Vascular;;  . CATARACT EXTRACTION W/PHACO Right 03/2010  . CATARACT EXTRACTION W/PHACO Left 02/24/2013   Procedure: CATARACT EXTRACTION PHACO AND INTRAOCULAR LENS PLACEMENT (IOC);  Surgeon: Tonny Branch, MD;  Location: [facility];  Service: Ophthalmology;  Laterality: Left;  CDE: 13.82  . ESOPHAGOGASTRODUODENOSCOPY N/A 08/26/2016   Procedure: ESOPHAGOGASTRODUODENOSCOPY (EGD);  Surgeon: Mauri Pole, MD;  Location: [facility] ENDOSCOPY;  Service: Endoscopy;  Laterality: N/A;  . EYE SURGERY    . HEMATOMA EVACUATION  09/04/2016   Procedure: EVACUATION HEMATOMA;  Surgeon: Serafina Mitchell, MD;  Location: [place];  Service: Vascular;;  . LAPAROTOMY N/A 09/06/2016   Procedure: EXPLORATORY LAPAROTOMY;  Surgeon: Serafina Mitchell, MD;  Location: [facility] OR;  Service: Vascular;  Laterality: N/A;    Family history: Family History  Problem Relation Age of Onset  . Hypertension Father     Social history:  Social History   Social History  . Marital status: Married    Spouse name: N/A  . Number of children: N/A  . Years of education: N/A   Occupational History  . Not on file.   Social History Main Topics  . Smoking status: Current Every Day Smoker    Packs/day: 1.50    Years: 55.00    Types: Cigarettes  .  Smokeless tobacco: Current User  . Alcohol use No  . Drug use: No  . Sexual activity: Not on file   Other Topics Concern  . Not on file   Social History Narrative  . No narrative on file    Current outpatient meds: Current Meds  Medication Sig  . atorvastatin (LIPITOR) 20 MG tablet Take 1 tablet (20 mg total) by mouth daily.  Marland Kitchen HYDROcodone-homatropine (HYCODAN) 5-1.5 MG/5ML syrup Take 5 mLs by mouth every 6  (six) hours as needed for cough.  Marland Kitchen levofloxacin (LEVAQUIN) 500 MG tablet Take 500 mg by mouth daily.   . Vitamin D, Ergocalciferol, (DRISDOL) 50000 units CAPS capsule Take 1 capsule (50,000 Units total) by mouth every 7 (seven) days.    Current inpatient meds:  Current Facility-Administered Medications  Medication Dose Route Frequency Provider Last Rate Last Dose  . 0.9 %  sodium chloride infusion  500 mL Intravenous Once PRN Ulyses Amor, PA-C      . 0.9 %  sodium chloride infusion   Intravenous Continuous Tammy S Parrett, NP 10 mL/hr at 09/09/16 0700    . albuterol (PROVENTIL) (2.5 MG/3ML) 0.083% nebulizer solution 2.5 mg  2.5 mg Nebulization Q4H PRN Dubois, MD      . artificial tears (LACRILUBE) ophthalmic ointment   Both Eyes Q4H PRN Serafina Mitchell, MD      . bisacodyl (DULCOLAX) suppository 10 mg  10 mg Rectal Daily PRN Ulyses Amor, PA-C   10 mg at 09/06/16 2359  . cefUROXime (ZINACEF) 1.5 g in dextrose 5 % 50 mL IVPB  1.5 g Intravenous Once Serafina Mitchell, MD      . chlorhexidine gluconate (MEDLINE KIT) (PERIDEX) 0.12 % solution 15 mL  15 mL Mouth Rinse BID Serafina Mitchell, MD   15 mL at 09/09/16 0800  . docusate (COLACE) 50 MG/5ML liquid 100 mg  100 mg Oral Daily Serafina Mitchell, MD   100 mg at 09/09/16 0949  . enoxaparin (LOVENOX) injection 40 mg  40 mg Subcutaneous Q24H Serafina Mitchell, MD   40 mg at 09/09/16 0949  . feeding supplement (PRO-STAT SUGAR FREE 64) liquid 30 mL  30 mL Per Tube BID Serafina Mitchell, MD   30 mL at 09/09/16 0949  . feeding supplement (VITAL HIGH PROTEIN) liquid 1,000 mL  1,000 mL Per Tube Q24H Angelia Mould, MD   1,000 mL at 09/08/16 1517  . fentaNYL (SUBLIMAZE) 2,500 mcg in sodium chloride 0.9 % 250 mL (10 mcg/mL) infusion  0-200 mcg/hr Intravenous Continuous Anders Simmonds, MD 15 mL/hr at 09/09/16 0700 150 mcg/hr at 09/09/16 0700  . fentaNYL (SUBLIMAZE) injection 25-200 mcg  25-200 mcg Intravenous Q1H PRN Anders Simmonds, MD       . furosemide (LASIX) injection 80 mg  80 mg Intravenous Q8H Brand Males, MD   80 mg at 09/09/16 0500  . hydrALAZINE (APRESOLINE) injection 5 mg  5 mg Intravenous Q20 Min PRN Ulyses Amor, PA-C      . labetalol (NORMODYNE,TRANDATE) injection 10 mg  10 mg Intravenous Q10 min PRN Ulyses Amor, PA-C   10 mg at 09/10/2016 2033  . magnesium sulfate IVPB 2 g 50 mL  2 g Intravenous Once Brand Males, MD   2 g at 09/09/16 0947  . MEDLINE mouth rinse  15 mL Mouth Rinse QID Serafina Mitchell, MD   15 mL at 09/09/16 0500  . metoprolol (LOPRESSOR) injection 2-5 mg  2-5 mg Intravenous Q2H PRN Ulyses Amor, PA-C   5 mg at 09/07/16 2120  . midazolam (VERSED) injection 1-4 mg  1-4 mg Intravenous Q2H PRN Brand Males, MD      . ondansetron (ZOFRAN) injection 4 mg  4 mg Intravenous Q6H PRN Ulyses Amor, PA-C      . pantoprazole (PROTONIX) injection 40 mg  40 mg Intravenous Q12H Serafina Mitchell, MD   40 mg at 09/09/16 0949  . phenol (CHLORASEPTIC) mouth spray 1 spray  1 spray Mouth/Throat PRN Ulyses Amor, PA-C      . piperacillin-tazobactam (ZOSYN) IVPB 3.375 g  3.375 g Intravenous Q8H Serafina Mitchell, MD   3.375 g at 09/09/16 0500  . potassium chloride SA (K-DUR,KLOR-CON) CR tablet 20-40 mEq  20-40 mEq Oral Daily PRN Ulyses Amor, PA-C      . propofol (DIPRIVAN) 1000 MG/100ML infusion  5-80 mcg/kg/min Intravenous Titrated Brand Males, MD        Allergies: No Known Allergies  ROS: As per HPI. A full 14-point review of systems Could not be performed as the patient is presently intubated and therefore unable to provide.   PE:  BP 119/65   Pulse 95   Temp 98.9 F (37.2 C) (Axillary)   Resp (!) 23   Ht 6' 1"  (1.854 m)   Wt 108.8 kg (239 lb 13.8 oz)   SpO2 98%   BMI 31.65 kg/m   General: WDWN intubated in ICU. He opens his eyes to voice and will briefly fix and track. He is able to follow simple midline and appendicular commands. He responds to questions by nodding and shaking his  head though this is sometimes inconsistent.  HEENT: Normocephalic. Neck supple without LAD. ETT, OGT in place. Sclerae anicteric. No conjunctival injection.  CV: Regular, no murmur. Carotid pulses full and symmetric, no bruits. Distal pulses 2+ and symmetric.  Lungs: CTAB on anterior exam.  Abdomen: Soft, obese. Surgical incision is healing well with no discharge. Scattered areas of ecchymosis noted around the incision. Bowel sounds hypoactive.  Extremities: Edema noted in the right upper extremity. Neuro:  CN: Pupils are equal and round. They are symmetrically reactive from 3-->2 mm. He appears to blink to visual threat from all quadrants. Eyes are conjugate. He is able to track the examiner's finger with some breakup of smooth pursuit in all directions of gaze. This is effort dependent, however. No nystagmus. Corneals are intact. His grimace is asymmetric with decreased mobility of the right side of his face compared to the left. The remainder of his cranial nerves cannot be accurately assessed as he is unable to participate with the examination.  Motor: Normal bulk. Tone is mildly diminished throughout, right more than left. His strength examination is somewhat effort dependent. However, he clearly appears to have a right hemiparesis, possibly affecting the leg more than arm. No tremor or other abnormal movements.  Sensation: He indicates symmetric light touch and seems to grimace to noxious stimulation equally 4. DTRs: 2+ in the left arm and at both knees. DTRs are limited in the right upper extremity, possibly due to the edema in that limb. Ankle jerks are absent. Toes mute.  Coordination and gait: Deferred as the patient is unable to participate with these portions of the examination at this time.  Labs:  Lab Results  Component Value Date   WBC 13.4 (H) 09/09/2016   HGB 8.7 (L) 09/09/2016   HCT 27.1 (L) 09/09/2016   PLT 186  09/09/2016   GLUCOSE 130 (H) 09/09/2016   CHOL 152 05/01/2016    TRIG 220 (H) 05/01/2016   HDL 30 (L) 05/01/2016   LDLDIRECT 60 08/25/2016   LDLCALC 78 05/01/2016   ALT 35 09/09/2016   AST 82 (H) 09/09/2016   NA 142 09/09/2016   K 2.8 (L) 09/09/2016   CL 103 09/09/2016   CREATININE 1.88 (H) 09/09/2016   BUN 41 (H) 09/09/2016   CO2 31 09/09/2016   TSH 0.668 12/25/2015   INR 1.32 09/04/2016    Imaging:  I have personally and independently reviewed the MRI scan of the brain without contrast from 09/09/16. This shows several scattered areas of restricted diffusion. The largest of these involves the left frontal lobe, mainly confined to the white matter though with some cortical involvement of the superior aspect of the frontal lobe. Additional small areas of restricted diffusion noted in the anterior aspect of the left frontal lobe, the medial right temporal lobe, and the right cerebellar hemisphere. These are all consistent with acute ischemic infarction. There is a moderate underlying burden of chronic small vessel ischemic change in the bihemispheric white matter.  TTE from 09/04/16 showed mild concentric left ventricular hypertrophy with ejection fraction 60-65 percent. Grade 1 diastolic dysfunction was noted. There is mild dilation of the left atrium.  Assessment and Plan:  1. Acute Ischemic Stroke: This is an acute stroke involving multiple vascular territories and both the anterior and posterior circulations. The largest area of infarct in the right frontal lobe was suggestive of a borderzone infarct which may have occurred in the setting of the significant hypertension he experienced after his surgery. The other areas may represent embolic infarctions in the setting of aortic surgery. Known risk factors for cerebrovascular disease in this patient include obesity, hyperlipidemia, and hypertension. TEE has been ordered and is pending. I will order fasting lipids and hemoglobin a1c. I will check carotid Dopplers given apparent watershed infarction in the  right carotid territory. Further testing will be determined by results from these initial studies. Recommend antiplatelet therapy with aspirin 81 mg daily for secondary stroke prevention long-term. Resume outpatient statin when able with goal LDL less than 70. Ensure adequate glucose control. Avoid fever and hyperglycemia as these can extend the infarct. Avoid hypotonic IVF to minimize exacerbation of post-stroke edema. Initiate rehab services. DVT prophylaxis as needed.   2. Right hemiparesis: This is acute, consistent with ischemic stroke as noted above. He will need PT, OT, rehabilitation once he is extubated and able to participate.  This was discussed with the patient's wife at the bedside. She is in agreement with the plan as noted. She was given the opportunity to ask any questions and these were addressed to her satisfaction.  Thank you for this consultation. The stroke team will assume care of the patient beginning 09/10/16. Please feel free to call any questions or concerns.

## 2016-09-09 NOTE — Progress Notes (Signed)
*  Preliminary Results* Bilateral upper extremity venous duplex completed. Right upper extremity is negative for deep vein thrombosis, there is evidence of acute superficial vein thrombosis involving the right cephalic vein. Left upper extremity is negative for deep and superficial vein thrombosis.  Preliminary results discussed with Joni Reiningicole, RN.  09/09/2016 12:26 PM  Gertie FeyMichelle Donis Kotowski, BS, RVT, RDCS, RDMS

## 2016-09-09 NOTE — Progress Notes (Addendum)
vPt noted to be in atrial fib with RVR rate 140s at 2126.  12 lead EKG done to document  Rhythm .  Pt given Lopressor 5 mg IV per order with rate decreased to 110-120.  Dr Blanchard KelchAvlva notified of same.  Will continue rate control as needed with Lopressor per order.  BP 119/74.  Wife at bedside updated on event and plan of care.

## 2016-09-09 NOTE — Progress Notes (Addendum)
eLink Physician-Brief Progress Note Patient Name: Michael Heath DOB: [birth year] MRN: [id number]   Date of Service  09/09/2016  HPI/Events of Note  AFIB with RVR - Ventricular rate = 157. BP = 124/63. Hx of AFIB.  eICU Interventions  Will order: 1. BMP and Mg++ level STAT. 2. Cardizem IV infusion. Titrate to HR < 105.     Intervention Category Major Interventions: Arrhythmia - evaluation and management  Michael Heath 09/09/2016, 11:38 PM

## 2016-09-09 NOTE — Progress Notes (Signed)
eLink Physician-Brief Progress Note Patient Name: Amil AmenDavid T Bourgoin DOB: 1946-06-14 MRN: [id number]   Date of Service  09/09/2016  HPI/Events of Note  K 2.8 Ca 7.4- corrected Ca for low alb is normal at 9.5  eICU Interventions  Replete K Recheck labs     Intervention Category Intermediate Interventions: Electrolyte abnormality - evaluation and management  Andra Matsuo 09/09/2016, 5:56 AM

## 2016-09-09 NOTE — Progress Notes (Signed)
Peripherally Inserted Central Catheter/Midline Placement  The IV Nurse has discussed with the patient and/or persons authorized to consent for the patient, the purpose of this procedure and the potential benefits and risks involved with this procedure.  The benefits include less needle sticks, lab draws from the catheter, and the patient may be discharged home with the catheter. Risks include, but not limited to, infection, bleeding, blood clot (thrombus formation), and puncture of an artery; nerve damage and irregular heartbeat and possibility to perform a PICC exchange if needed/ordered by physician.  Alternatives to this procedure were also discussed.  Bard Power PICC patient education guide, fact sheet on infection prevention and patient information card has been provided to patient /or left at bedside.    PICC/Midline Placement Documentation        [name], [name] 09/09/2016, 11:41 AM Consent obtained by Arlina RobesLinda Alford, RN from wife at bedside.

## 2016-09-09 NOTE — Progress Notes (Signed)
PULMONARY / CRITICAL CARE MEDICINE   Name: Michael Heath MRN: [id number] DOB: [birth year]    ADMISSION DATE:  08/21/2016 CONSULTATION DATE:  09/01/2016  REFERRING MD:  Coral Else MD  CHIEF COMPLAINT:  Ruptured AAA aneurysm status post repair, acute hypoxic respiratory failure.  brief 71 year old with past medical history of hypertension, hyperlipidemia, CKD, stage III. Admitted today with right flank, back pain. A CTA which showed ruptured AAA. He was transferred emergently to [facility] for stent repair. In the OR he received 16 units PRBC, 10 units FFP, 3 units platelets, 1 unit cryo and 1 Cell Saver. He developed abdominal comparment syndrome in OR from massive RP bleed. He is brought back to the ICU with an open abdomen. PCCM consulted for vent, sedation management.  CT Angio chest, abdomen 09/13/2016- ruptured 7.8 cm abdominal aortic aneurysm with a large RP hematoma. Moderate emphysema. No focal pneumonia, consolidation, opacities. CXR 09/01/16- COPD, no active cardio pulmonary disease All images personally reviewed. Echo 2/15 > N EF, diastolic dysfxn  LINES/TUBES: ETT 2/13 Rt IJ cordis 2/13  SIGNIFICANT EVENTS: 2/13- Admit, OR for ruptured AAA. 2/14- Back to OR for abdomina closure.  2/18 -   Weaned off pressors 2/17 . B/p stable this am  .  Still not following commands, limited responsiveness  Versed stopped this am . Weaning fentanyl -increased wob  Wife at bedside , updated.  Trickle feeds started 2/17   X 2./19 - RUE weakness overnight; CT head negative. Off pressors. Volume overloaded. Wife and grandson at bedside. RASS -3 off sedation gtt x 1h    SUBJECTIVE/OVERNIGHT/INTERVAL HX 09/09/2016 - MRI with water shet infarct. Uanble to tolerate prn sedation. Diuresing. Still 11L positive.   VITAL SIGNS: BP 119/65   Pulse 95   Temp 99.6 F (37.6 C) (Oral)   Resp (!) 23   Ht 6\' 1"  (1.854 m)   Wt 108.8 kg (239 lb 13.8 oz)   SpO2 98%   BMI 31.65 kg/m   HEMODYNAMICS:     VENTILATOR SETTINGS: Vent Mode: PRVC FiO2 (%):  [60 %-80 %] 70 % Set Rate:  [22 bmp] 22 bmp Vt Set:  [640 mL] 640 mL PEEP:  [5 cmH20-12 cmH20] 12 cmH20 Plateau Pressure:  [20 cmH20-27 cmH20] 22 cmH20  INTAKE / OUTPUT: I/O last 3 completed shifts: In: 2838.7 [I.V.:803.7; NG/GT:1020; IV Piggyback:1015] Out: 7031 [Urine:7030; Stool:1]  PHYSICAL EXAMINATION:  General Appearance:    Looks criticall ill OBESE - NO  Head:    Normocephalic, without obvious abnormality, atraumatic  Eyes:    PERRL - yes, conjunctiva/corneas - clear      Ears:    Normal external ear canals, both ears  Nose:    has OG tube by GI. (GI has said that extubation  Throat:  ETT TUBE - yes   Neck:   Supple,  No enlargement/tenderness/nodules;     Lungs:     Clear to auscultation bilaterally, Ventilator   Synchrony -YES  Chest wall:    No deformity  Heart:    S1 and S2 normal, no murmur, CVP - n/a.  Pressors - none  Abdomen:     Soft, no masses, no organomegaly  Genitalia:    Not done  Rectal:   not done  Extremities:   Extremities - RUE more swollen 09/09/2016 warm with good capillary refill      Skin:   Intact in exposed areas . Sacral area - no reports of decub     Neurologic:   Sedation -  fent tt -> RASS - -3 . Moves all 4s - RUE weak. CAM-ICU - n/a . Orientation - n/a       LABS:   PULMONARY  Recent Labs Lab 08/28/2016 2042 09/08/2016 2146 08/26/2016 2339 09/04/16 0416 09/04/16 1500 09/06/16 0430  PHART 7.392 7.465* 7.385 7.478* 7.418 7.357  PCO2ART 52.2* 41.2 49.4* 42.1 47.4 48.0  PO2ART 59.0* 54.0* 81.0* 94.0 59.0* 66.3*  HCO3 31.7* 29.5* 29.2* 31.0* 30.6* 26.0  TCO2 33 31 31 32 32  --   O2SAT 89.0 89.0 95.0 98.0 90.0 90.9    CBC  Recent Labs Lab 09/07/16 0456 09/08/16 0318 09/09/16 0501  HGB 8.5* 9.0* 8.7*  HCT 26.8* 28.3* 27.1*  WBC 13.3* 14.5* 13.4*  PLT 134* 162 186    COAGULATION  Recent Labs Lab 09/14/2016 2007 09/10/2016 0842 08/24/2016 1630 09/10/2016 2055  09/04/16 1458  INR 1.45 1.39 1.22 1.41 1.32    CARDIAC    Recent Labs Lab 08/27/2016 1200 09/10/2016 2055 09/07/16 2128 09/08/16 0318 09/09/16 0501  TROPONINI 0.16* 0.11* <0.03 <0.03 <0.03   No results for input(s): PROBNP in the last 168 hours.   CHEMISTRY  Recent Labs Lab 09/05/16 0450 09/06/16 0430 09/07/16 0456 09/07/16 2128 09/08/16 0318 09/09/16 0501  NA 142 143 143 142 144 142  K 3.8 3.7 3.6 3.0* 3.6 2.8*  CL 110 109 109 103 106 103  CO2 26 25 28 28 29 31   GLUCOSE 109* 96 130* 121* 142* 130*  BUN 24* 28* 33* 33* 37* 41*  CREATININE 1.78* 1.91* 1.86* 1.81* 1.84* 1.88*  CALCIUM 7.0* 7.6* 7.8* 7.7* 7.8* 7.4*  MG 1.9 1.9 1.8 1.7 2.1 1.8  PHOS 2.8 3.1 3.1  --  2.7 2.9   Estimated Creatinine Clearance: 46.6 mL/min (by C-G formula based on SCr of 1.88 mg/dL (H)).   LIVER  Recent Labs Lab 08/31/2016 2007  09/09/2016 0842 08/21/2016 1630 09/09/2016 2055 09/04/16 0200 09/04/16 1458 09/08/16 0318 09/09/16 0501  AST  --   < >  --  36  --  28 29 85* 82*  ALT  --   < >  --  22  --  17 18 32 35  ALKPHOS  --   < >  --  33*  --  32* 38 42 50  BILITOT  --   < >  --  1.2  --  1.9* 3.7* 5.9* 4.7*  PROT  --   < >  --  4.1*  --  4.1* 4.9* 5.3* 5.4*  ALBUMIN  --   < >  --  2.1*  --  2.1* 2.0* 1.5* 1.4*  INR 1.45  --  1.39 1.22 1.41  --  1.32  --   --   < > = values in this interval not displayed.   INFECTIOUS  Recent Labs Lab 09/04/16 1459 09/04/16 2115 09/08/16 0320  LATICACIDVEN 1.4 1.4 1.7     ENDOCRINE CBG (last 3)   Recent Labs  09/08/16 1514 09/08/16 1950 09/08/16 2359  GLUCAP 145* 144* 134*         IMAGING x48h  - image(s) personally visualized  -   highlighted in bold Ct Head Wo Contrast  Result Date: 09/07/2016 CLINICAL DATA:  71 year old male with acute encephalopathy. EXAM: CT HEAD WITHOUT CONTRAST TECHNIQUE: Contiguous axial images were obtained from the base of the skull through the vertex without intravenous contrast. COMPARISON:   Brain MRI dated 08/25/2016 FINDINGS: Brain: The ventricles and sulci appropriate in size for  patient's age. Mild to moderate periventricular and deep white matter chronic microvascular ischemic changes primarily involving the left frontoparietal centrum semiovale. There is no acute intracranial hemorrhage. No mass effect or midline shift noted. No extra-axial fluid collection. Vascular: No hyperdense vessel or unexpected calcification. Skull: Normal. Negative for fracture or focal lesion. Sinuses/Orbits: There is diffuse mucoperiosteal thickening of paranasal sinuses with near complete opacification of the right maxillary sinus partial opacification of the ethmoid air cells and frontal sinuses. No air-fluid levels. There is also diffuse mucoperiosteal thickening of the mastoid air cells, right greater than left. Other: Partially calcified ovoid soft tissue density in the subcutaneous soft tissues of the left occiput. IMPRESSION: 1. No acute intracranial hemorrhage. 2. Age-related atrophy and chronic microvascular ischemic changes. If symptoms persist, and there are no contraindications, MRI may provide better evaluation if clinically indicated. Electronically Signed   By: Elgie Collard M.D.   On: 09/07/2016 22:01   Mr Brain Wo Contrast  Result Date: 09/09/2016 CLINICAL DATA:  Acute hypoxic respiratory failure EXAM: MRI HEAD WITHOUT CONTRAST TECHNIQUE: Multiplanar, multiecho pulse sequences of the brain and surrounding structures were obtained without intravenous contrast. COMPARISON:  Brain MRI 08/25/2016 FINDINGS: Brain: There are multiple areas of diffusion restriction. The largest is in the superior left frontal white matter, in a watershed distribution. The other foci are punctate, suggesting an embolic origin. These are located in the right frontal white matter, left inferior frontal white matter, right medial temporal lobe and right cerebellum. There is no associated hemorrhage or mass effect. There is  beginning confluent hyperintense T2-weighted signal within the periventricular white matter, most often seen in the setting of chronic microvascular ischemia. No mass lesion or midline shift. No hydrocephalus or extra-axial fluid collection. The midline structures are normal. No age advanced or lobar predominant atrophy. Vascular: Major intracranial arterial and venous sinus flow voids are preserved. No evidence of chronic microhemorrhage or amyloid angiopathy. Skull and upper cervical spine: The visualized skull base, calvarium, upper cervical spine and extracranial soft tissues are normal. Sinuses/Orbits: Complete opacification the right maxillary sinus and partial opacification of the ethmoid sinuses, sphenoid sinuses and right frontal sinus. Bilateral mastoid effusions. Normal orbits. IMPRESSION: 1. Multifocal acute ischemia, including the left frontal watershed region, possibly secondary to an acute hypotensive episode. The other scattered punctate foci of ischemia are more suggestive of a central cardio embolic process. 2. No acute hemorrhage or mass effect. 3. Electronically Signed   By: Deatra Robinson M.D.   On: 09/09/2016 03:45   Mr Cervical Spine Wo Contrast  Result Date: 09/09/2016 CLINICAL DATA:  Right upper extremity weakness EXAM: MRI CERVICAL SPINE WITHOUT CONTRAST TECHNIQUE: Multiplanar, multisequence MR imaging of the cervical spine was performed. No intravenous contrast was administered. COMPARISON:  None. FINDINGS: Alignment: Normal Vertebrae: There are large anterior osteophytes at C3-C6. No acute compression fracture, discitis-osteomyelitis, facet edema or other focal marrow lesion. No epidural collection. Cord: No focal signal abnormality. Posterior Fossa, vertebral arteries, paraspinal tissues: Visualized posterior fossa is normal. Vertebral artery flow voids are preserved. Normal visualized paraspinal soft tissues. Disc levels: C1-C2: Normal. C2-C3: Normal disc space and facets. No spinal  canal or neuroforaminal stenosis. C3-C4: Right-greater-than-left uncovertebral hypertrophy with mild right foraminal stenosis. C4-C5: Bilateral mild uncovertebral hypertrophy with mild narrowing of the left neural foramen. C5-C6: Large left eccentric disc osteophyte complex causing severe spinal canal and left neural foraminal stenosis. Moderate right foraminal stenosis. C6-C7: Normal disc space and facets. No spinal canal or neuroforaminal stenosis. C7-T1: Normal disc space  and facets. No spinal canal or neuroforaminal stenosis. IMPRESSION: 1. 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 2. Mild narrowing at the right C4 and left C5 neural foramina. Electronically Signed   By: Deatra Robinson M.D.   On: 09/09/2016 04:17   Dg Chest Port 1 View  Result Date: 09/08/2016 CLINICAL DATA:  Followup ventilator support. EXAM: PORTABLE CHEST 1 VIEW COMPARISON:  09/07/2016 FINDINGS: Endotracheal tube tip is 4 cm above the carina. Nasogastric tube enters the abdomen. Right internal jugular venous access sheath remains in place. Persistent bilateral effusions with lower lobe volume loss, left more than right. Persistent fluid overload/ edema. IMPRESSION: No change since yesterday. Lines and tubes well positioned. Persistent effusions, dependent atelectasis and edema. Electronically Signed   By: Paulina Fusi M.D.   On: 09/08/2016 07:11     STUDIES:   CULTURES: MRSA 2/14 > (-) Sputum 2/14 >norm flora  Blood 2/14 >   ANTIBIOTICS: Cefuroxime 2/13> 2/14 Periop Vanc 2/15 > Zosyn 2/15 >    DISCUSSION: Patient admitted  for acute AAA rupture and had endovascular repair of ruptured AAA and ex-lap for abdominal compartment syndrome. Abdomen was kept open after procedure then kept intubated post op. Patient with hypoxemia and resp failure  after receiveing significant amounts of blood products. Pt went back to OR on 2/14 for abdominal closure.  Remains on the vent.    ASSESSMENT / PLAN:  PULMONARY A: #baseline:  reports that he has been smoking Cigarettes.  He has a 82.50 pack-year smoking history. He uses smokeless tobacco. l likely has copd  #current: acute resp failure to AAA rupture  09/09/2016 -> Does not meet sbt criteira due to sedation gtt and high fio2 /peep  P:   Full vent support  CARDIOVASCULAR A:  #baseline: CAD risk factors + #current AAA ruopture this admit and repair  09/09/2016 -> Off pressors but concern for RUE DVT  P:  MAP > 65 Get UE venous duple  RENAL A:   #baseline: ckd #current Maintains Creat 1.8  09/09/2016 -> nil acute but mag < 2  P:   moitor  GASTROINTESTINAL A:   Hx of OG tube insertin by GI with backround dysphagia as outpatint  P:   OG to continue - apparently removal needs to be careful  HEMATOLOGIC  Recent Labs Lab 09/07/16 0456 09/08/16 0318 09/09/16 0501  HGB 8.5* 9.0* 8.7*  HCT 26.8* 28.3* 27.1*  WBC 13.3* 14.5* 13.4*  PLT 134* 162 186    A:   #RBC: anemia critical illness - strble #Platelet - ok #WBC  Stable leukycytosis  P:  - PRBC for hgb </= 6.9gm%    - exceptions are   -  if ACS susepcted/confirmed then transfuse for hgb </= 8.0gm%,  or    -  active bleeding with hemodynamic instability, then transfuse regardless of hemoglobin value   At at all times try to transfuse 1 unit prbc as possible with exception of active hemorrhage  Cotnnue Lovenox 40mg  SQ   INFECTIOUS Results for orders placed or performed during the hospital encounter of 09/08/2016  MRSA PCR Screening     Status: None   Collection Time: 08/26/2016  8:47 PM  Result Value Ref Range Status   MRSA by PCR NEGATIVE NEGATIVE Final    Comment:        The GeneXpert MRSA Assay (FDA approved for NASAL specimens only), is one component of a comprehensive MRSA colonization surveillance program.  It is not intended to diagnose MRSA infection nor to guide or monitor treatment for MRSA  infections.   Culture, respiratory (NON-Expectorated)     Status: None   Collection Time: 09/10/2016 12:24 PM  Result Value Ref Range Status   Specimen Description TRACHEAL ASPIRATE  Final   Special Requests Normal  Final   Gram Stain   Final    FEW WBC PRESENT,BOTH PMN AND MONONUCLEAR FEW SQUAMOUS EPITHELIAL CELLS PRESENT NO ORGANISMS SEEN    Culture Consistent with normal respiratory flora.  Final   Report Status 09/06/2016 FINAL  Final  Culture, blood (routine x 2)     Status: None (Preliminary result)   Collection Time: 09/04/16  4:11 PM  Result Value Ref Range Status   Specimen Description BLOOD LEFT HAND  Final   Special Requests IN PEDIATRIC BOTTLE 0.5CC  Final   Culture NO GROWTH 4 DAYS  Final   Report Status PENDING  Incomplete  Culture, blood (routine x 2)     Status: None (Preliminary result)   Collection Time: 09/04/16  4:26 PM  Result Value Ref Range Status   Specimen Description BLOOD RIGHT ANTECUBITAL  Final   Special Requests BOTTLES DRAWN AEROBIC ONLY 5CC  Final   Culture NO GROWTH 4 DAYS  Final   Report Status PENDING  Incomplete   No results for input(s): PROCALCITON in the last 168 hours.   A:   Possible aspiration v HCAP P:   Will dc vanc Anti-infectives    Start     Dose/Rate Route Frequency Ordered Stop   09/04/16 1600  vancomycin (VANCOCIN) IVPB 750 mg/150 ml premix     750 mg 150 mL/hr over 60 Minutes Intravenous Every 12 hours 09/04/16 1451     09/04/16 1500  piperacillin-tazobactam (ZOSYN) IVPB 3.375 g     3.375 g 12.5 mL/hr over 240 Minutes Intravenous Every 8 hours 09/04/16 1451     09/14/2016 1900  cefUROXime (ZINACEF) 1.5 g in dextrose 5 % 50 mL IVPB     1.5 g 100 mL/hr over 30 Minutes Intravenous  Once 09/08/2016 1849     Sep 28, 2016 2015  cefUROXime (ZINACEF) 1.5 g in dextrose 5 % 50 mL IVPB     1.5 g 100 mL/hr over 30 Minutes Intravenous Every 12 hours September 28, 2016 2006 09/05/2016 1013       ENDOCRINE A:   At risk hyperglycemia P:   ICU  hyperglycemia protocol  NEUROLOGIC A:   #Baseline : chronic hycodan but otherwise negative hx  #Current: Agitrated on wUA   09/09/2016 -> RASS -3 on sedation gtt P:   RASS goal: 0 to -2 contue sedation gtt fent Add dioprivan to come off fent   FAMILY  - Updates: 09/09/2016 --> wife updated at bedside  - Inter-disciplinary family meet or Palliative Care meeting due by:  DAy 7 which is 09/09/2016  Current LOS is LOS 7 days   DISPO Keep in icu   The patient is critically ill with multiple organ systems failure and requires high complexity decision making for assessment and support, frequent evaluation and titration of therapies, application of advanced monitoring technologies and extensive interpretation of multiple databases.   Critical Care Time devoted to patient care services described in this note is  30  Minutes. This time reflects time of care of this signee Dr Kalman Shan. This critical care time does not reflect procedure time, or teaching time or supervisory time of PA/NP/Med student/Med Resident etc but could involve care discussion time  Dr. Kalman Shan, M.D., [facility].C.P Pulmonary and Critical Care Medicine Staff Physician [place] [facility] [place] [facility] Pager: [phone], If no answer or between  15:00h - 7:00h: call [phone]  09/09/2016 8:40 AM         ASSESSMENT / PLAN:  PULMONARY A: Acute resp failure   2/19 - high peep and fio2 prevents sbt/extubation P:   Full vent support  CARDIOVASCULAR A:  S/p endovascular rrepair of ruptured AAA 2/13 and abd closure 2/14  2/19  - off pressors. Had A Fib RVR but now resolved P:  Dc cardizem  RENAL A:   Volume overloaded  P:   Diureses aggresively  GASTROINTESTINAL A:   On TF P:   Continue tf Continue ppi  HEMATOLOGIC A:   Anemia of critical illness P:  - PRBC for hgb </= 6.9gm%    - exceptions are   -  if ACS susepcted/confirmed then transfuse  for hgb </= 8.0gm%,  or    - active bleeding with hemodynamic instability, then transfuse regardless of hemoglobin value   At at all times try to transfuse 1 unit prbc as possible with exception of active hemorrhage    INFECTIOUS A:   Possible HCAP P:   Anti-infectives    Start     Dose/Rate Route Frequency Ordered Stop   09/04/16 1600  vancomycin (VANCOCIN) IVPB 750 mg/150 ml premix     750 mg 150 mL/hr over 60 Minutes Intravenous Every 12 hours 09/04/16 1451     09/04/16 1500  piperacillin-tazobactam (ZOSYN) IVPB 3.375 g     3.375 g 12.5 mL/hr over 240 Minutes Intravenous Every 8 hours 09/04/16 1451     08/24/2016 1900  cefUROXime (ZINACEF) 1.5 g in dextrose 5 % 50 mL IVPB     1.5 g 100 mL/hr over 30 Minutes Intravenous  Once 09/17/2016 1849     09/06/2016 2015  cefUROXime (ZINACEF) 1.5 g in dextrose 5 % 50 mL IVPB     1.5 g 100 mL/hr over 30 Minutes Intravenous Every 12 hours 08/24/2016 2006 09/06/2016 1013       ENDOCRINE A:   hyperglycemia P:   icu hyperglycemia protocol  NEUROLOGIC A:   RASS -3 off fent gtt x 1 h - 2/19 New onset RUE weakness 2/18; CT h ead negative P:   RASS goal: 0 Dc sedation gtt Change to fent prn Dependinn on WUA - will consider MRI   FAMILY  - Updates: wife and grandson updated 09/09/2016   - Inter-disciplinary family meet or Palliative Care meeting due by day 7 which is 09/09/16     The patient is critically ill with multiple organ systems failure and requires high complexity decision making for assessment and support, frequent evaluation and titration of therapies, application of advanced monitoring technologies and extensive interpretation of multiple databases.   Critical Care Time devoted to patient care services described in this note is  30  Minutes. This time reflects time of care of this signee Dr Kalman Shan. This critical care time does not reflect procedure time, or teaching time or supervisory time of PA/NP/Med student/Med  Resident etc but could involve care discussion time    Dr. Kalman Shan, M.D., [facility].C.P Pulmonary and Critical Care Medicine Staff Physician [place] [facility] [place] [facility] Pager: [phone], If no answer or between  15:00h - 7:00h: call [phone]  09/09/2016 8:33 AM

## 2016-09-09 NOTE — Progress Notes (Signed)
Pharmacy Antibiotic Note  Michael Heath is a 71 y.o. male continues on day#6 zosyn for aspiration pneumonia. WBC has trended down from 22>14 and patient has remained afebrile for approximately 24 hours. Renal function remains stable with sCr 1.7-1.9. Vancomycin has been discontinued as of today.   Plan: Continue zosyn 3.375g IV q8 (EI)  Height: 6\' 1"  (185.4 cm) Weight: 239 lb 13.8 oz (108.8 kg) IBW/kg (Calculated) : 79.9  Temp (24hrs), Avg:99.6 F (37.6 C), Min:98.9 F (37.2 C), Max:100.8 F (38.2 C)   Recent Labs Lab 12-17-2016 1509  09/12/2016 0842  09/04/16 1459 09/04/16 2115 09/05/16 0450 09/06/16 0430 09/06/16 1530 09/07/16 0456 09/07/16 2128 09/08/16 0318 09/08/16 0320 09/09/16 0501  WBC  --   < >  --   < >  --   --  20.2* 18.4*  --  13.3*  --  14.5*  --  13.4*  CREATININE  --   < > 1.85*  < >  --   --  1.78* 1.91*  --  1.86* 1.81* 1.84*  --  1.88*  LATICACIDVEN 9.75*  --  1.3  --  1.4 1.4  --   --   --   --   --   --  1.7  --   VANCOTROUGH  --   --   --   --   --   --   --   --  15  --   --   --   --   --   < > = values in this interval not displayed.  Estimated Creatinine Clearance: 46.6 mL/min (by C-G formula based on SCr of 1.88 mg/dL (H)).    No Known Allergies  Antimicrobials this admission: 2/15 Vancomycin >>2/20 2/15 Zosyn >>  Dose adjustments this admission:  Microbiology results: 2/14 resp>> normal flora 2/15 blood x2>> ngtd  Thank you for allowing pharmacy to be a part of this patient's care.  Michael Heath, PharmD Clinical Pharmacist Pager: [phone] 09/09/2016 12:43 PM

## 2016-09-10 ENCOUNTER — Inpatient Hospital Stay (HOSPITAL_COMMUNITY): Payer: Medicare Other

## 2016-09-10 DIAGNOSIS — I480 Paroxysmal atrial fibrillation: Secondary | ICD-10-CM

## 2016-09-10 DIAGNOSIS — I638 Other cerebral infarction: Secondary | ICD-10-CM

## 2016-09-10 LAB — BASIC METABOLIC PANEL
Anion gap: 8 (ref 5–15)
Anion gap: 9 (ref 5–15)
BUN: 47 mg/dL — AB (ref 6–20)
BUN: 47 mg/dL — ABNORMAL HIGH (ref 6–20)
CALCIUM: 8 mg/dL — AB (ref 8.9–10.3)
CO2: 34 mmol/L — AB (ref 22–32)
CO2: 34 mmol/L — AB (ref 22–32)
CREATININE: 2.01 mg/dL — AB (ref 0.61–1.24)
Calcium: 7.9 mg/dL — ABNORMAL LOW (ref 8.9–10.3)
Chloride: 103 mmol/L (ref 101–111)
Chloride: 103 mmol/L (ref 101–111)
Creatinine, Ser: 2 mg/dL — ABNORMAL HIGH (ref 0.61–1.24)
GFR calc Af Amer: 37 mL/min — ABNORMAL LOW (ref >60)
GFR calc non Af Amer: 32 mL/min — ABNORMAL LOW (ref >60)
GFR calc non Af Amer: 32 mL/min — ABNORMAL LOW (ref >60)
GFR, EST AFRICAN AMERICAN: 37 mL/min — AB (ref >60)
GLUCOSE: 138 mg/dL — AB (ref 65–99)
Glucose, Bld: 139 mg/dL — ABNORMAL HIGH (ref 65–99)
POTASSIUM: 3.4 mmol/L — AB (ref 3.5–5.1)
Potassium: 3.1 mmol/L — ABNORMAL LOW (ref 3.5–5.1)
Sodium: 145 mmol/L (ref 135–145)
Sodium: 146 mmol/L — ABNORMAL HIGH (ref 135–145)

## 2016-09-10 LAB — CBC WITH DIFFERENTIAL/PLATELET
Basophils Absolute: 0 10*3/uL (ref 0.0–0.1)
Basophils Relative: 0 %
EOS PCT: 1 %
Eosinophils Absolute: 0.1 10*3/uL (ref 0.0–0.7)
HEMATOCRIT: 29.1 % — AB (ref 39.0–52.0)
Hemoglobin: 9.1 g/dL — ABNORMAL LOW (ref 13.0–17.0)
LYMPHS ABS: 2 10*3/uL (ref 0.7–4.0)
Lymphocytes Relative: 14 %
MCH: 29.9 pg (ref 26.0–34.0)
MCHC: 31.3 g/dL (ref 30.0–36.0)
MCV: 95.7 fL (ref 78.0–100.0)
MONO ABS: 1.3 10*3/uL — AB (ref 0.1–1.0)
Monocytes Relative: 9 %
NEUTROS ABS: 11 10*3/uL — AB (ref 1.7–7.7)
Neutrophils Relative %: 76 %
Platelets: 212 10*3/uL (ref 150–400)
RBC: 3.04 MIL/uL — ABNORMAL LOW (ref 4.22–5.81)
RDW: 17.4 % — AB (ref 11.5–15.5)
WBC: 14.4 10*3/uL — ABNORMAL HIGH (ref 4.0–10.5)

## 2016-09-10 LAB — GLUCOSE, CAPILLARY
Glucose-Capillary: 135 mg/dL — ABNORMAL HIGH (ref 65–99)
Glucose-Capillary: 135 mg/dL — ABNORMAL HIGH (ref 65–99)
Glucose-Capillary: 145 mg/dL — ABNORMAL HIGH (ref 65–99)
Glucose-Capillary: 135 mg/dL — ABNORMAL HIGH (ref 65–99)
Glucose-Capillary: 137 mg/dL — ABNORMAL HIGH (ref 65–99)

## 2016-09-10 LAB — LIPID PANEL
CHOLESTEROL: 106 mg/dL (ref 0–200)
HDL: <10 mg/dL — ABNORMAL LOW (ref >40)
Triglycerides: 231 mg/dL — ABNORMAL HIGH (ref <150)
VLDL: 46 mg/dL — AB (ref 0–40)

## 2016-09-10 LAB — MAGNESIUM
Magnesium: 2.2 mg/dL (ref 1.7–2.4)
Magnesium: 2.2 mg/dL (ref 1.7–2.4)

## 2016-09-10 LAB — PHOSPHORUS: Phosphorus: 2.9 mg/dL (ref 2.5–4.6)

## 2016-09-10 LAB — PROCALCITONIN: PROCALCITONIN: 0.91 ng/mL

## 2016-09-10 MED ORDER — PRO-STAT SUGAR FREE PO LIQD
60.0000 mL | Freq: Two times a day (BID) | ORAL | Status: DC
  Administered 2016-09-10 – 2016-09-11 (×3): 60 mL
  Filled 2016-09-10 (×4): qty 60

## 2016-09-10 MED ORDER — VITAL AF 1.2 CAL PO LIQD: 1000.0000 mL | ORAL | Status: DC

## 2016-09-10 MED ORDER — DEXMEDETOMIDINE HCL IN NACL 200 MCG/50ML IV SOLN: 0.4000 ug/kg/h | INTRAVENOUS | Status: DC

## 2016-09-10 MED ORDER — VITAL 1.5 CAL PO LIQD
1000.0000 mL | ORAL | Status: DC
  Administered 2016-09-10 – 2016-09-11 (×2): 1000 mL
  Filled 2016-09-10 (×5): qty 1000

## 2016-09-10 MED ORDER — METOPROLOL TARTRATE 25 MG PO TABS
25.0000 mg | ORAL_TABLET | Freq: Two times a day (BID) | ORAL | Status: DC
Start: 2016-09-10 — End: 2016-09-11
  Administered 2016-09-10 – 2016-09-11 (×3): 25 mg via NASOGASTRIC
  Filled 2016-09-10 (×3): qty 1

## 2016-09-10 MED ORDER — DILTIAZEM HCL-DEXTROSE 100-5 MG/100ML-% IV SOLN (PREMIX)
5.0000 mg/h | INTRAVENOUS | Status: DC
  Administered 2016-09-10: 15 mg/h via INTRAVENOUS
  Administered 2016-09-10 – 2016-09-11 (×3): 10 mg/h via INTRAVENOUS
  Administered 2016-09-11 – 2016-09-14 (×6): 15 mg/h via INTRAVENOUS
  Filled 2016-09-10 (×12): qty 100

## 2016-09-10 MED ORDER — ASPIRIN 81 MG PO CHEW
81.0000 mg | CHEWABLE_TABLET | Freq: Once | ORAL | Status: AC
  Administered 2016-09-10: 81 mg via ORAL
  Filled 2016-09-10: qty 1

## 2016-09-10 MED ORDER — POTASSIUM CHLORIDE 2 MEQ/ML IV SOLN
30.0000 meq | Freq: Once | INTRAVENOUS | Status: AC
  Administered 2016-09-10: 30 meq via INTRAVENOUS
  Filled 2016-09-10: qty 15

## 2016-09-10 MED ORDER — POTASSIUM CHLORIDE 20 MEQ/15ML (10%) PO SOLN
40.0000 meq | Freq: Once | ORAL | Status: AC
  Administered 2016-09-10: 40 meq
  Filled 2016-09-10: qty 30

## 2016-09-10 NOTE — Progress Notes (Signed)
Nutrition Follow Up  DOCUMENTATION CODES:   Not applicable  INTERVENTION:    D/C Vital High Protein   Initiate Vital 1.5 formula at 20 ml/hr and increase by 10 ml every 6 hours to goal rate of 50 ml/hr   Prostat liquid protein 60 ml BID   Total TF regimen to provide 2200 kcals, 141 gm protein, 917 ml of free water  NUTRITION DIAGNOSIS:   Inadequate oral intake related to inability to eat as evidenced by NPO status, ongoing  GOAL:   Patient will meet greater than or equal to 90% of their needs, currently unmet  MONITOR:   TF tolerance, Vent status, Labs, Weight trends, Skin, I & O's  ASSESSMENT:   [name] wiht PMH of HTN, HLD, CKD, stage III. Admitted with right flank, back pain. A CTA which showed ruptured AAA. He was [facility] emerg[facility] stent [facility].    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 S/p abdominal washout and closure 2/15. Vital High Protein started 2/17 via OGT and currently infusing at trickle rate of 20 ml/hr. Labs and medications reviewed. CBG's [id number].  Verbal with Read Back order received per Dr. Myra GianottiBrabham to manage TF regimen.  Diet Order:  Diet NPO time specified  Skin:  Reviewed, no issues  Last BM:  2/20  Height:   Ht Readings from Last 1 Encounters:  09/10/16 6\' 1"  (1.854 m)    Weight:   Wt Readings from Last 1 Encounters:  09/10/16 239 lb 10.2 oz (108.7 kg)  Admit wt         222 lb (100.7 kg)  Ideal Body Weight:  83.6 kg  BMI:  Body mass index is 31.62 kg/m.  Estimated Nutritional Needs:   Kcal:  2254  Protein:  140-150 gm  Fluid:  per MD  EDUCATION NEEDS:   No education  needs identified at this time  Maureen ChattersKatie Valita Righter, RD, LDN Pager #: [phone] After-Hours Pager #: [phone]

## 2016-09-10 NOTE — Care Management Note (Signed)
Case Management Note  Patient Details  Name: Michael Heath MRN: [id number] Date of Birth: [birth year]  Subjective/Objective:   Lives with spouse - discussed discharge options with her per request.  She thinks that rehab @ SNF will be best option for pt.  Pt is currently vented and on cardizem gtt.           Expected Discharge Plan:  Skilled Nursing Facility  In-House Referral:  Clinical Social Work  Discharge planning Services  CM Consult  Status of Service:  In process, will continue to follow  Jamelle RushingMayo, Juwon Scripter T, RN 09/10/2016, 2:15 PM

## 2016-09-10 NOTE — Progress Notes (Signed)
PULMONARY / CRITICAL CARE MEDICINE   Name: Michael Heath MRN: [id number] DOB: 11-20-1945    ADMISSION DATE:  Sep 21, 2016 CONSULTATION DATE:  Sep 21, 2016  REFERRING MD:  Coral Else MD  CHIEF COMPLAINT:  Ruptured AAA aneurysm status post repair, acute hypoxic respiratory failure.  brief 71 year old with past medical history of hypertension, hyperlipidemia, CKD, stage III. Admitted today with right flank, back pain. A CTA which showed ruptured AAA. He was transferred emergently to [facility] for stent repair. In the OR he received 16 units PRBC, 10 units FFP, 3 units platelets, 1 unit cryo and 1 Cell Saver. He developed abdominal comparment syndrome in OR from massive RP bleed. He is brought back to the ICU with an open abdomen. PCCM consulted for vent, sedation management.  CT Angio chest, abdomen 2016/09/21- ruptured 7.8 cm abdominal aortic aneurysm with a large RP hematoma. Moderate emphysema. No focal pneumonia, consolidation, opacities. CXR 09/01/16- COPD, no active cardio pulmonary disease All images personally reviewed. Echo 2/15 > N EF, diastolic dysfxn  LINES/TUBES: ETT 2/13 Rt IJ cordis 2/13  SIGNIFICANT EVENTS: 2/13- Admit, OR for ruptured AAA. 2/14- Back to OR for abdomina closure.  2/18 -   Weaned off pressors 2/17 . B/p stable this am  .  Still not following commands, limited responsiveness  Versed stopped this am . Weaning fentanyl -increased wob  Wife at bedside , updated.  Trickle feeds started 2/17  2./19 - RUE weakness overnight; CT head negative. Off pressors. Volume overloaded. Wife and grandson at bedside. RASS -3 off sedation gtt x 1h   SUBJECTIVE/OVERNIGHT/INTERVAL HX 2/20 - down to 60% fio2, peep 10. RUE improved. Coming down in sedation gtt. Less volume overloaded   SUBJECTIVE/OVERNIGHT/INTERVAL HX 09/10/2016 - MRI with water shet infarct. Uanble to tolerate prn sedation. Diuresing. Still 11L positive. Back in A Fib RVR and on cadizem  VITAL SIGNS: BP 129/60  (BP Location: Left Leg)   Pulse 83   Temp 100 F (37.8 C) (Oral)   Resp (!) 22   Ht 6\' 1"  (1.854 m)   Wt 108.7 kg (239 lb 10.2 oz)   SpO2 93%   BMI 31.62 kg/m   HEMODYNAMICS:    VENTILATOR SETTINGS: Vent Mode: PRVC FiO2 (%):  [50 %-60 %] 60 % Set Rate:  [22 bmp] 22 bmp Vt Set:  [640 mL] 640 mL PEEP:  [10 cmH20] 10 cmH20 Plateau Pressure:  [20 cmH20-25 cmH20] 21 cmH20  INTAKE / OUTPUT: I/O last 3 completed shifts: In: 3385.5 [I.V.:1660.5; NG/GT:1060; IV Piggyback:665] Out: 7820 [Urine:7820]  PHYSICAL EXAMINATION:   General Appearance:    Looks criticall ill OBESE - no  Head:    Normocephalic, without obvious abnormality, atraumatic  Eyes:    PERRL - yes, conjunctiva/corneas -muddy      Ears:    Normal external ear canals, both ears  Nose:   NG tube - no  Throat:  ETT TUBE - yes , OG tube - YES  Neck:   Supple,  No enlargement/tenderness/nodules     Lungs:     Clear to auscultation bilaterally, Ventilator   Synchrony - yes on 60% fio2/peep 10 -> pulse ox 92%  Chest wall:    No deformity  Heart:    S1 and S2 normal, no murmur, CVP - na.  Pressors - none  Abdomen:     Soft, no masses, no organomegaly  Genitalia:    Not done  Rectal:   not done  Extremities:   Extremities- edema improved. RUE less  swwolloen (has SVT)     Skin:   Intact in exposed areas . Sacral area - none per rn     Neurologic:   Sedation - fent gtt at 75 -> RASS - 0 . Moves all 4s - yes though rue weakest. CAM-ICU - neg for delirum . Orientation - n/a        LABS:   PULMONARY  Recent Labs Lab 09/11/2016 2042 09/08/2016 2146 09/11/2016 2339 09/04/16 0416 09/04/16 1500 09/06/16 0430  PHART 7.392 7.465* 7.385 7.478* 7.418 7.357  PCO2ART 52.2* 41.2 49.4* 42.1 47.4 48.0  PO2ART 59.0* 54.0* 81.0* 94.0 59.0* 66.3*  HCO3 31.7* 29.5* 29.2* 31.0* 30.6* 26.0  TCO2 33 31 31 32 32  --   O2SAT 89.0 89.0 95.0 98.0 90.0 90.9    CBC  Recent Labs Lab 09/08/16 0318 09/09/16 0501 09/10/16 0539   HGB 9.0* 8.7* 9.1*  HCT 28.3* 27.1* 29.1*  WBC 14.5* 13.4* 14.4*  PLT 162 186 212    COAGULATION  Recent Labs Lab 09/14/2016 0842 09/11/2016 1630 08/24/2016 2055 09/04/16 1458  INR 1.39 1.22 1.41 1.32    CARDIAC    Recent Labs Lab 08/30/2016 1200 09/08/2016 2055 09/07/16 2128 09/08/16 0318 09/09/16 0501  TROPONINI 0.16* 0.11* <0.03 <0.03 <0.03   No results for input(s): PROBNP in the last 168 hours.   CHEMISTRY  Recent Labs Lab 09/06/16 0430 09/07/16 0456 09/07/16 2128 09/08/16 0318 09/09/16 0501 09/09/16 1720 09/09/16 2338 09/10/16 0537 09/10/16 0539  NA 143 143 142 144 142 145 145 146*  --   K 3.7 3.6 3.0* 3.6 2.8* 3.4* 3.1* 3.4*  --   CL 109 109 103 106 103 102 103 103  --   CO2 25 28 28 29 31  33* 34* 34*  --   GLUCOSE 96 130* 121* 142* 130* 140* 139* 138*  --   BUN 28* 33* 33* 37* 41* 46* 47* 47*  --   CREATININE 1.91* 1.86* 1.81* 1.84* 1.88* 1.98* 2.01* 2.00*  --   CALCIUM 7.6* 7.8* 7.7* 7.8* 7.4* 8.0* 8.0* 7.9*  --   MG 1.9 1.8 1.7 2.1 1.8  --  2.2  --  2.2  PHOS 3.1 3.1  --  2.7 2.9  --   --   --  2.9   Estimated Creatinine Clearance: 43.8 mL/min (by C-G formula based on SCr of 2 mg/dL (H)).   LIVER  Recent Labs Lab 09/13/2016 0842 09/12/2016 1630 08/31/2016 2055 09/04/16 0200 09/04/16 1458 09/08/16 0318 09/09/16 0501  AST  --  36  --  28 29 85* 82*  ALT  --  22  --  17 18 32 35  ALKPHOS  --  33*  --  32* 38 42 50  BILITOT  --  1.2  --  1.9* 3.7* 5.9* 4.7*  PROT  --  4.1*  --  4.1* 4.9* 5.3* 5.4*  ALBUMIN  --  2.1*  --  2.1* 2.0* 1.5* 1.4*  INR 1.39 1.22 1.41  --  1.32  --   --      INFECTIOUS  Recent Labs Lab 09/04/16 1459 09/04/16 2115 09/08/16 0320  LATICACIDVEN 1.4 1.4 1.7     ENDOCRINE CBG (last 3)   Recent Labs  09/09/16 1936 09/09/16 2342 09/10/16 0400  GLUCAP 128* 123* 135*         IMAGING x48h  - image(s) personally visualized  -   highlighted in bold Mr Brain Wo Contrast  Result Date: 09/09/2016 CLINICAL  DATA:  Acute hypoxic respiratory failure EXAM: MRI HEAD WITHOUT CONTRAST TECHNIQUE: Multiplanar, multiecho pulse sequences of the brain and surrounding structures were obtained without intravenous contrast. COMPARISON:  Brain MRI 08/25/2016 FINDINGS: Brain: There are multiple areas of diffusion restriction. The largest is in the superior left frontal white matter, in a watershed distribution. The other foci are punctate, suggesting an embolic origin. These are located in the right frontal white matter, left inferior frontal white matter, right medial temporal lobe and right cerebellum. There is no associated hemorrhage or mass effect. There is beginning confluent hyperintense T2-weighted signal within the periventricular white matter, most often seen in the setting of chronic microvascular ischemia. No mass lesion or midline shift. No hydrocephalus or extra-axial fluid collection. The midline structures are normal. No age advanced or lobar predominant atrophy. Vascular: Major intracranial arterial and venous sinus flow voids are preserved. No evidence of chronic microhemorrhage or amyloid angiopathy. Skull and upper cervical spine: The visualized skull base, calvarium, upper cervical spine and extracranial soft tissues are normal. Sinuses/Orbits: Complete opacification the right maxillary sinus and partial opacification of the ethmoid sinuses, sphenoid sinuses and right frontal sinus. Bilateral mastoid effusions. Normal orbits. IMPRESSION: 1. Multifocal acute ischemia, including the left frontal watershed region, possibly secondary to an acute hypotensive episode. The other scattered punctate foci of ischemia are more suggestive of a central cardio embolic process. 2. No acute hemorrhage or mass effect. 3. Electronically Signed   By: Deatra Robinson M.D.   On: 09/09/2016 03:45   Mr Cervical Spine Wo Contrast  Result Date: 09/09/2016 CLINICAL DATA:  Right upper extremity weakness EXAM: MRI CERVICAL SPINE WITHOUT  CONTRAST TECHNIQUE: Multiplanar, multisequence MR imaging of the cervical spine was performed. No intravenous contrast was administered. COMPARISON:  None. FINDINGS: Alignment: Normal Vertebrae: There are large anterior osteophytes at C3-C6. No acute compression fracture, discitis-osteomyelitis, facet edema or other focal marrow lesion. No epidural collection. Cord: No focal signal abnormality. Posterior Fossa, vertebral arteries, paraspinal tissues: Visualized posterior fossa is normal. Vertebral artery flow voids are preserved. Normal visualized paraspinal soft tissues. Disc levels: C1-C2: Normal. C2-C3: Normal disc space and facets. No spinal canal or neuroforaminal stenosis. C3-C4: Right-greater-than-left uncovertebral hypertrophy with mild right foraminal stenosis. C4-C5: Bilateral mild uncovertebral hypertrophy with mild narrowing of the left neural foramen. C5-C6: Large left eccentric disc osteophyte complex causing severe spinal canal and left neural foraminal stenosis. Moderate right foraminal stenosis. C6-C7: Normal disc space and facets. No spinal canal or neuroforaminal stenosis. C7-T1: Normal disc space and facets. No spinal canal or neuroforaminal stenosis. IMPRESSION: 1. 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 2. Mild narrowing at the right C4 and left C5 neural foramina. Electronically Signed   By: Deatra Robinson M.D.   On: 09/09/2016 04:17   Dg Chest Port 1 View  Result Date: 09/10/2016 CLINICAL DATA:  Followup ventilator support. EXAM: PORTABLE CHEST 1 VIEW COMPARISON:  09/08/2016 FINDINGS: Endotracheal tube tip is 4 cm above the carina. Nasogastric tube enters the abdomen. There is less edema. Lower lobe volume loss left more than right persists. Left arm PICC tip again seen at the SVC RA junction. IMPRESSION: Less edema.  Persistent lower lobe volume loss left more than right. Electronically Signed   By: Paulina Fusi M.D.   On: 09/10/2016 07:43     STUDIES:   CULTURES: MRSA 2/14 > (-) Sputum 2/14 >norm flora  Blood 2/14 >   ANTIBIOTICS: Cefuroxime 2/13> 2/14 Periop Vanc 2/15 >  Zosyn 2/15 >    DISCUSSION: Patient admitted  for acute AAA rupture and had endovascular repair of ruptured AAA and ex-lap for abdominal compartment syndrome. Abdomen was kept open after procedure then kept intubated post op. Patient with hypoxemia and resp failure  after receiveing significant amounts of blood products. Pt went back to OR on 2/14 for abdominal closure. Remains on the vent.    ASSESSMENT / PLAN:  ASSESSMENT / PLAN:  PULMONARY A: #baseline: heavy smoker, likely has copd #current: Acute resp failure, following aaa rutpture  09/10/2016 -> does not meet sbt criteria  P:   Full vent support  CARDIOVASCULAR A:  #baseline: cad risk factors + #current AAA ruprutre at this admit with repair and closure 2/20 - RUE SVT + and new recurrent A Fibon cardizemg tt  09/10/2016 -> off peroessr  P:  Map goal > 65 Continue cardizem gtt Consider IV heparin gtt 09/11/16   RENAL  Intake/Output Summary (Last 24 hours) at 09/10/16 0846 Last data filed at 09/10/16 0700  Gross per 24 hour  Intake          2224.91 ml  Output             5170 ml  Net         -2945.09 ml    Recent Labs Lab 09/08/16 0318 09/09/16 0501 09/09/16 1720 09/09/16 2338 09/10/16 0537  CREATININE 1.84* 1.88* 1.98* 2.01* 2.00*     A:   #baseline: ckd #current Creat 2.0mg  and rising with lasix. Mild low k repleted  09/10/2016 -> nil acute  P:   Monitor with lasix  GASTROINTESTINAL A:   On TF   P:   Continue TF PPI  HEMATOLOGIC  Recent Labs Lab 09/08/16 0318 09/09/16 0501 09/10/16 0539  HGB 9.0* 8.7* 9.1*  HCT 28.3* 27.1* 29.1*  WBC 14.5* 13.4* 14.4*  PLT 162 186 212    A:   #RBC: anemia of critical illness #Platelet ok #WBC stable leukocytosis  P:  - PRBC for hgb </= 6.9gm%    -  exceptions are   -  if ACS susepcted/confirmed then transfuse for hgb </= 8.0gm%,  or    -  active bleeding with hemodynamic instability, then transfuse regardless of hemoglobin value   At at all times try to transfuse 1 unit prbc as possible with exception of active hemorrhage  - Continue lovenox 40mg  SQ  INFECTIOUS No results for input(s): PROCALCITON in the last 168 hours.  Results for orders placed or performed during the hospital encounter of September 17, 2016  MRSA PCR Screening     Status: None   Collection Time: 09/17/16  8:47 PM  Result Value Ref Range Status   MRSA by PCR NEGATIVE NEGATIVE Final    Comment:        The GeneXpert MRSA Assay (FDA approved for NASAL specimens only), is one component of a comprehensive MRSA colonization surveillance program. It is not intended to diagnose MRSA infection nor to guide or monitor treatment for MRSA infections.   Culture, respiratory (NON-Expectorated)     Status: None   Collection Time: 08/24/2016 12:24 PM  Result Value Ref Range Status   Specimen Description TRACHEAL ASPIRATE  Final   Special Requests Normal  Final   Gram Stain   Final    FEW WBC PRESENT,BOTH PMN AND MONONUCLEAR FEW SQUAMOUS EPITHELIAL CELLS PRESENT NO ORGANISMS SEEN    Culture Consistent with normal respiratory flora.  Final   Report Status 09/06/2016 FINAL  Final  Culture, blood (routine x 2)     Status: None   Collection Time: 09/04/16  4:11 PM  Result Value Ref Range Status   Specimen Description BLOOD LEFT HAND  Final   Special Requests IN PEDIATRIC BOTTLE 0.5CC  Final   Culture NO GROWTH 5 DAYS  Final   Report Status 09/09/2016 FINAL  Final  Culture, blood (routine x 2)     Status: None   Collection Time: 09/04/16  4:26 PM  Result Value Ref Range Status   Specimen Description BLOOD RIGHT ANTECUBITAL  Final   Special Requests BOTTLES DRAWN AEROBIC ONLY 5CC  Final   Culture NO GROWTH 5 DAYS  Final   Report Status 09/09/2016 FINAL  Final    A:    Possible Aspiration v HCAP  2/21- low grade fever + P:   chjeck PCT Anti-infectives    Start     Dose/Rate Route Frequency Ordered Stop   09/04/16 1600  vancomycin (VANCOCIN) IVPB 750 mg/150 ml premix  Status:  Discontinued     750 mg 150 mL/hr over 60 Minutes Intravenous Every 12 hours 09/04/16 1451 09/09/16 0850   09/04/16 1500  piperacillin-tazobactam (ZOSYN) IVPB 3.375 g     3.375 g 12.5 mL/hr over 240 Minutes Intravenous Every 8 hours 09/04/16 1451     08/28/2016 1900  cefUROXime (ZINACEF) 1.5 g in dextrose 5 % 50 mL IVPB     1.5 g 100 mL/hr over 30 Minutes Intravenous  Once 09/02/2016 1849     09/01/2016 2015  cefUROXime (ZINACEF) 1.5 g in dextrose 5 % 50 mL IVPB     1.5 g 100 mL/hr over 30 Minutes Intravenous Every 12 hours 09/06/2016 2006 08/29/2016 1013       ENDOCRINE A:   hyoperglicyema P:   ICU hyperglycemia protocol  NEUROLOGIC A:   #Baseline : negative CNS/Psych hx  #Current  - acute agitated encephalopathy post intubation  - New RUE weakness with watershed infacrt in Rt carotid terroty   09/10/2016 -> improving rue weakness and encephaloapthy P:   RASS goal: 0  Wean off fent gtt Start precedex gtt Asa 81mg     FAMILY  - Updates: 09/10/2016 --> wife updated 09/10/2016   - Inter-disciplinary family meet or Palliative Care meeting due by:  DAy 7. Current LOS is LOS 8 days - sdone yesterday 2/20 - full medical care and full code   DISPO Keep in ICU   The patient is critically ill with multiple organ systems failure and requires high complexity decision making for assessment and support, frequent evaluation and titration of therapies, application of advanced monitoring technologies and extensive interpretation of multiple databases.   Critical Care Time devoted to patient care services described in this note is  30  Minutes. This time reflects time of care of this signee Dr Kalman Shan. This critical care time does not reflect procedure time, or teaching  time or supervisory time of PA/NP/Med student/Med Resident etc but could involve care discussion time    Dr. Kalman Shan, M.D., [facility].C.P Pulmonary and Critical Care Medicine Staff Physician [facility] Pulmonary and Critical Care Pager: [phone], If no answer or between  15:00h - 7:00h: call [phone]  09/10/2016 8:46 AM          PULMONARY A: #baseline:  reports that he has been smoking Cigarettes.  He has a 82.50 pack-year smoking history. He uses smokeless tobacco. l likely has copd  #current: acute resp failure  to AAA rupture  09/10/2016 -> Does not meet sbt criteira due to sedation gtt and high fio2 /peep  P:   Full vent support  CARDIOVASCULAR A:  #baseline: CAD risk factors + #current AAA ruopture this admit and repair  09/10/2016 -> Off pressors but concern for RUE DVT  P:  MAP > 65 Get UE venous duple  RENAL A:   #baseline: ckd #current Maintains Creat 1.8  09/10/2016 -> nil acute but mag < 2  P:   moitor  GASTROINTESTINAL A:   Hx of OG tube insertin by GI with backround dysphagia as outpatint  P:   OG to continue - apparently removal needs to be careful  HEMATOLOGIC  Recent Labs Lab 09/08/16 0318 09/09/16 0501 09/10/16 0539  HGB 9.0* 8.7* 9.1*  HCT 28.3* 27.1* 29.1*  WBC 14.5* 13.4* 14.4*  PLT 162 186 212    A:   #RBC: anemia critical illness - strble #Platelet - ok #WBC  Stable leukycytosis  P:  - PRBC for hgb </= 6.9gm%    - exceptions are   -  if ACS susepcted/confirmed then transfuse for hgb </= 8.0gm%,  or    -  active bleeding with hemodynamic instability, then transfuse regardless of hemoglobin value   At at all times try to transfuse 1 unit prbc as possible with exception of active hemorrhage  Cotnnue Lovenox 40mg  SQ   INFECTIOUS Results for orders placed or performed during the hospital encounter of 09/13/2016  MRSA PCR Screening     Status: None   Collection Time: 08/26/2016  8:47 PM   Result Value Ref Range Status   MRSA by PCR NEGATIVE NEGATIVE Final    Comment:        The GeneXpert MRSA Assay (FDA approved for NASAL specimens only), is one component of a comprehensive MRSA colonization surveillance program. It is not intended to diagnose MRSA infection nor to guide or monitor treatment for MRSA infections.   Culture, respiratory (NON-Expectorated)     Status: None   Collection Time: 09/15/2016 12:24 PM  Result Value Ref Range Status   Specimen Description TRACHEAL ASPIRATE  Final   Special Requests Normal  Final   Gram Stain   Final    FEW WBC PRESENT,BOTH PMN AND MONONUCLEAR FEW SQUAMOUS EPITHELIAL CELLS PRESENT NO ORGANISMS SEEN    Culture Consistent with normal respiratory flora.  Final   Report Status 09/06/2016 FINAL  Final  Culture, blood (routine x 2)     Status: None   Collection Time: 09/04/16  4:11 PM  Result Value Ref Range Status   Specimen Description BLOOD LEFT HAND  Final   Special Requests IN PEDIATRIC BOTTLE 0.5CC  Final   Culture NO GROWTH 5 DAYS  Final   Report Status 09/09/2016 FINAL  Final  Culture, blood (routine x 2)     Status: None   Collection Time: 09/04/16  4:26 PM  Result Value Ref Range Status   Specimen Description BLOOD RIGHT ANTECUBITAL  Final   Special Requests BOTTLES DRAWN AEROBIC ONLY 5CC  Final   Culture NO GROWTH 5 DAYS  Final   Report Status 09/09/2016 FINAL  Final   No results for input(s): PROCALCITON in the last 168 hours.   A:   Possible aspiration v HCAP P:   Will dc vanc Anti-infectives    Start     Dose/Rate Route Frequency Ordered Stop   09/04/16 1600  vancomycin (VANCOCIN) IVPB 750 mg/150 ml premix  Status:  Discontinued  750 mg 150 mL/hr over 60 Minutes Intravenous Every 12 hours 09/04/16 1451 09/09/16 0850   09/04/16 1500  piperacillin-tazobactam (ZOSYN) IVPB 3.375 g     3.375 g 12.5 mL/hr over 240 Minutes Intravenous Every 8 hours 09/04/16 1451     09/17/2016 1900  cefUROXime (ZINACEF)  1.5 g in dextrose 5 % 50 mL IVPB     1.5 g 100 mL/hr over 30 Minutes Intravenous  Once 09/09/2016 1849     09/06/2016 2015  cefUROXime (ZINACEF) 1.5 g in dextrose 5 % 50 mL IVPB     1.5 g 100 mL/hr over 30 Minutes Intravenous Every 12 hours 08/21/2016 2006 08/23/2016 1013       ENDOCRINE A:   At risk hyperglycemia P:   ICU hyperglycemia protocol  NEUROLOGIC A:   #Baseline : chronic hycodan but otherwise negative hx  #Current: Agitrated on wUA   09/10/2016 -> RASS -3 on sedation gtt P:   RASS goal: 0 to -2 contue sedation gtt fent Add dioprivan to come off fent   FAMILY  - Updates: 09/10/2016 --> wife updated at bedside  - Inter-disciplinary family meet or Palliative Care meeting due by:  DAy 7 which is 09/10/2016  Current LOS is LOS 8 days   DISPO Keep in icu   The patient is critically ill with multiple organ systems failure and requires high complexity decision making for assessment and support, frequent evaluation and titration of therapies, application of advanced monitoring technologies and extensive interpretation of multiple databases.   Critical Care Time devoted to patient care services described in this note is  30  Minutes. This time reflects time of care of this signee Dr Kalman Shan. This critical care time does not reflect procedure time, or teaching time or supervisory time of PA/NP/Med student/Med Resident etc but could involve care discussion time    Dr. Kalman Shan, M.D., [facility].C.P Pulmonary and Critical Care Medicine Staff Physician [facility] [place] Pulmonary and Critical Care Pager: [phone], If no answer or between  15:00h - 7:00h: call [phone]  09/10/2016 8:40 AM         ASSESSMENT / PLAN:  PULMONARY A: Acute resp failure   2/19 - high peep and fio2 prevents sbt/extubation P:   Full vent support  CARDIOVASCULAR A:  S/p endovascular rrepair of ruptured AAA 2/13 and abd closure 2/14  2/19  - off  pressors. Had A Fib RVR but now resolved P:  Dc cardizem  RENAL A:   Volume overloaded  P:   Diureses aggresively  GASTROINTESTINAL A:   On TF P:   Continue tf Continue ppi  HEMATOLOGIC A:   Anemia of critical illness P:  - PRBC for hgb </= 6.9gm%    - exceptions are   -  if ACS susepcted/confirmed then transfuse for hgb </= 8.0gm%,  or    - active bleeding with hemodynamic instability, then transfuse regardless of hemoglobin value   At at all times try to transfuse 1 unit prbc as possible with exception of active hemorrhage    INFECTIOUS A:   Possible HCAP P:   Anti-infectives    Start     Dose/Rate Route Frequency Ordered Stop   09/04/16 1600  vancomycin (VANCOCIN) IVPB 750 mg/150 ml premix  Status:  Discontinued     750 mg 150 mL/hr over 60 Minutes Intravenous Every 12 hours 09/04/16 1451 09/09/16 0850   09/04/16 1500  piperacillin-tazobactam (ZOSYN) IVPB 3.375 g  3.375 g 12.5 mL/hr over 240 Minutes Intravenous Every 8 hours 09/04/16 1451     08/23/2016 1900  cefUROXime (ZINACEF) 1.5 g in dextrose 5 % 50 mL IVPB     1.5 g 100 mL/hr over 30 Minutes Intravenous  Once 08/28/2016 1849     08/28/2016 2015  cefUROXime (ZINACEF) 1.5 g in dextrose 5 % 50 mL IVPB     1.5 g 100 mL/hr over 30 Minutes Intravenous Every 12 hours 08/31/2016 2006 09/01/2016 1013       ENDOCRINE A:   hyperglycemia P:   icu hyperglycemia protocol  NEUROLOGIC A:   RASS -3 off fent gtt x 1 h - 2/19 New onset RUE weakness 2/18; CT h ead negative P:   RASS goal: 0 Dc sedation gtt Change to fent prn Dependinn on WUA - will consider MRI   FAMILY  - Updates: wife and grandson updated 09/10/2016   - Inter-disciplinary family meet or Palliative Care meeting due by day 7 which is 09/09/16     The patient is critically ill with multiple organ systems failure and requires high complexity decision making for assessment and support, frequent evaluation and titration of therapies, application  of advanced monitoring technologies and extensive interpretation of multiple databases.   Critical Care Time devoted to patient care services described in this note is  30  Minutes. This time reflects time of care of this signee Dr Kalman Shan. This critical care time does not reflect procedure time, or teaching time or supervisory time of PA/NP/Med student/Med Resident etc but could involve care discussion time    Dr. Kalman Shan, M.D., [facility].C.P Pulmonary and Critical Care Medicine Staff Physician [facility] [place] Pulmonary and Critical Care Pager: [phone], If no answer or between  15:00h - 7:00h: call [phone]  09/10/2016 8:40 AM

## 2016-09-10 NOTE — Progress Notes (Signed)
Progress Note  Patient Name: Michael Heath Date of Encounter: 09/10/2016  Primary Cardiologist: Linard Millers   Subjective   71 yo with hx of CAD , HTN and ruptured AAA Now , s/p repair Developed atrial fib overnight    Inpatient Medications    Scheduled Meds: . cefUROXime (ZINACEF)  IV  1.5 g Intravenous Once  . chlorhexidine gluconate (MEDLINE KIT)  15 mL Mouth Rinse BID  . Chlorhexidine Gluconate Cloth  6 each Topical Daily  . docusate  100 mg Oral Daily  . enoxaparin (LOVENOX) injection  40 mg Subcutaneous Q24H  . feeding supplement (PRO-STAT SUGAR FREE 64)  30 mL Per Tube BID  . feeding supplement (VITAL HIGH PROTEIN)  1,000 mL Per Tube Q24H  . furosemide  80 mg Intravenous Q8H  . mouth rinse  15 mL Mouth Rinse QID  . pantoprazole (PROTONIX) IV  40 mg Intravenous Q12H  . piperacillin-tazobactam (ZOSYN)  IV  3.375 g Intravenous Q8H  . sodium chloride flush  10-40 mL Intracatheter Q12H   Continuous Infusions: . sodium chloride Stopped (09/10/16 0022)  . dexmedetomidine    . dilTIAZem HCl-Dextrose 15 mg/hr (09/10/16 0700)  . fentaNYL infusion INTRAVENOUS 50 mcg/hr (09/10/16 0800)   PRN Meds: sodium chloride, albuterol, artificial tears, bisacodyl, fentaNYL (SUBLIMAZE) injection, hydrALAZINE, labetalol, midazolam, ondansetron, phenol, potassium chloride, sodium chloride flush   Vital Signs    Vitals:   09/10/16 0800 09/10/16 0900 09/10/16 0950 09/10/16 1000  BP: (!) 145/77 (!) 149/79  (!) 144/81  Pulse: 86 88  86  Resp: (!) 22 20  (!) 23  Temp:  99.7 F (37.6 C)    TempSrc:  Oral    SpO2: 93% 94% 93% 93%  Weight:      Height:        Intake/Output Summary (Last 24 hours) at 09/10/16 1121 Last data filed at 09/10/16 1000  Gross per 24 hour  Intake          1822.41 ml  Output             5170 ml  Net         -3347.59 ml   Filed Weights   09/08/16 0304 09/09/16 0600 09/10/16 0500  Weight: 259 lb 0.7 oz (117.5 kg) 239 lb 13.8 oz (108.8 kg) 239 lb 10.2 oz  (108.7 kg)    Telemetry    NSR - Personally Reviewed  ECG    NSR  - Personally Reviewed  Physical Exam   GEN:  intubated, asleep No acute distress.   Neck: No JVD Cardiac: RRR, no murmurs, rubs, or gallops.  Respiratory: rhonchi and wheezes bilaterally , on the vent  GI: Soft, nontender, non-distended  MS: No edema; No deformity. Neuro:  unable to assess  Psych: unable to assess   Labs    Chemistry Recent Labs Lab 09/04/16 1458  09/08/16 0318 09/09/16 0501 09/09/16 1720 09/09/16 2338 09/10/16 0537  NA 142  < > 144 142 145 145 146*  K 4.0  < > 3.6 2.8* 3.4* 3.1* 3.4*  CL 107  < > 106 103 102 103 103  CO2 27  < > 29 31 33* 34* 34*  GLUCOSE 111*  < > 142* 130* 140* 139* 138*  BUN 22*  < > 37* 41* 46* 47* 47*  CREATININE 1.70*  < > 1.84* 1.88* 1.98* 2.01* 2.00*  CALCIUM 7.0*  < > 7.8* 7.4* 8.0* 8.0* 7.9*  PROT 4.9*  --  5.3* 5.4*  --   --   --  ALBUMIN 2.0*  --  1.5* 1.4*  --   --   --   AST 29  --  85* 82*  --   --   --   ALT 18  --  32 35  --   --   --   ALKPHOS 38  --  42 50  --   --   --   BILITOT 3.7*  --  5.9* 4.7*  --   --   --   GFRNONAA 39*  < > 35* 34* 32* 32* 32*  GFRAA 45*  < > 41* 40* 37* 37* 37*  ANIONGAP 8  < > _0 < > = values in this interval not displayed.   Hematology Recent Labs Lab 09/08/16 0318 09/09/16 0501 09/10/16 0539  WBC 14.5* 13.4* 14.4*  RBC 3.08* 2.92* 3.04*  HGB 9.0* 8.7* 9.1*  HCT 28.3* 27.1* 29.1*  MCV 91.9 92.8 95.7  MCH 29.2 29.8 29.9  MCHC 31.8 32.1 31.3  RDW 16.4* 16.9* 17.4*  PLT 162 186 212    Cardiac Enzymes Recent Labs Lab 08/25/2016 2055 09/07/16 2128 09/08/16 0318 09/09/16 0501  TROPONINI 0.11* <0.03 <0.03 <0.03   No results for input(s): TROPIPOC in the last 168 hours.   BNPNo results for input(s): BNP, PROBNP in the last 168 hours.   DDimer  Recent Labs Lab 08/23/2016 1630  DDIMER >20.00*     Radiology    Mr Brain Wo Contrast  Result Date: 09/09/2016 CLINICAL DATA:  Acute hypoxic  respiratory failure EXAM: MRI HEAD WITHOUT CONTRAST TECHNIQUE: Multiplanar, multiecho pulse sequences of the brain and surrounding structures were obtained without intravenous contrast. COMPARISON:  Brain MRI 08/25/2016 FINDINGS: Brain: There are multiple areas of diffusion restriction. The largest is in the superior left frontal white matter, in a watershed distribution. The other foci are punctate, suggesting an embolic origin. These are located in the right frontal white matter, left inferior frontal white matter, right medial temporal lobe and right cerebellum. There is no associated hemorrhage or mass effect. There is beginning confluent hyperintense T2-weighted signal within the periventricular white matter, most often seen in the setting of chronic microvascular ischemia. No mass lesion or midline shift. No hydrocephalus or extra-axial fluid collection. The midline structures are normal. No age advanced or lobar predominant atrophy. Vascular: Major intracranial arterial and venous sinus flow voids are preserved. No evidence of chronic microhemorrhage or amyloid angiopathy. Skull and upper cervical spine: The visualized skull base, calvarium, upper cervical spine and extracranial soft tissues are normal. Sinuses/Orbits: Complete opacification the right maxillary sinus and partial opacification of the ethmoid sinuses, sphenoid sinuses and right frontal sinus. Bilateral mastoid effusions. Normal orbits. IMPRESSION: 1. Multifocal acute ischemia, including the left frontal watershed region, possibly secondary to an acute hypotensive episode. The other scattered punctate foci of ischemia are more suggestive of a central cardio embolic process. 2. No acute hemorrhage or mass effect. 3. Electronically Signed   By: Ulyses Jarred M.D.   On: 09/09/2016 03:45   Mr Cervical Spine Wo Contrast  Result Date: 09/09/2016 CLINICAL DATA:  Right upper extremity weakness EXAM: MRI CERVICAL SPINE WITHOUT CONTRAST TECHNIQUE:  Multiplanar, multisequence MR imaging of the cervical spine was performed. No intravenous contrast was administered. COMPARISON:  None. FINDINGS: Alignment: Normal Vertebrae: There are large anterior osteophytes at C3-C6. No acute compression fracture, discitis-osteomyelitis, facet edema or other focal marrow lesion. No epidural collection. Cord: No focal signal abnormality. Posterior Fossa, vertebral arteries, paraspinal tissues:  Visualized posterior fossa is normal. Vertebral artery flow voids are preserved. Normal visualized paraspinal soft tissues. Disc levels: C1-C2: Normal. C2-C3: Normal disc space and facets. No spinal canal or neuroforaminal stenosis. C3-C4: Right-greater-than-left uncovertebral hypertrophy with mild right foraminal stenosis. C4-C5: Bilateral mild uncovertebral hypertrophy with mild narrowing of the left neural foramen. C5-C6: Large left eccentric disc osteophyte complex causing severe spinal canal and left neural foraminal stenosis. Moderate right foraminal stenosis. C6-C7: Normal disc space and facets. No spinal canal or neuroforaminal stenosis. C7-T1: Normal disc space and facets. No spinal canal or neuroforaminal stenosis. IMPRESSION: 1. 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 2. Mild narrowing at the right C4 and left C5 neural foramina. Electronically Signed   By: Ulyses Jarred M.D.   On: 09/09/2016 04:17   Dg Chest Port 1 View  Result Date: 09/10/2016 CLINICAL DATA:  Followup ventilator support. EXAM: PORTABLE CHEST 1 VIEW COMPARISON:  09/08/2016 FINDINGS: Endotracheal tube tip is 4 cm above the carina. Nasogastric tube enters the abdomen. There is less edema. Lower lobe volume loss left more than right persists. Left arm PICC tip again seen at the SVC RA junction. IMPRESSION: Less edema.  Persistent lower lobe volume loss left more than right. Electronically Signed   By: Nelson Chimes M.D.   On:  09/10/2016 07:43    Cardiac Studies   Tele :   Paroxysmal atrial fib   Patient Profile     71 y.o. male with ruptured AAA .     Assessment & Plan    1. PAF :  Has had some atrial fib at night. Has resolved with IV dilt Will start metoprolol Will need to discuss anticoagulation after he improves if he continues to have episodes of AF     Signed, Mertie Moores, MD  09/10/2016, 11:21 AM

## 2016-09-10 NOTE — Progress Notes (Addendum)
AAA Progress Note    09/10/2016 8:17 AM 7 Days Post-Op  Subjective:  intubated  Tm 100 HR 80's-130's Afib with RVR last night now NSR (Cardizem gtt) 100's-140's systolic 90% .[id number]  Gtts:  Cardizem Fentanyl  Abx:   Vanc/Zosyn  Vitals:   09/10/16 0530 09/10/16 0600  BP: 137/62 129/60  Pulse: 85 83  Resp: (!) 23 (!) 22  Temp:      Physical Exam: Cardiac:  regular Lungs:  Decreased BS right base otherwise clear Abdomen:  Soft, NT/ND; -BM since 2/19 Incisions:  Clean and dry Extremities:  2-3+ palpable DP pulses bilaterally Neuro:  Awake on the vent; following commands; able to softly squeeze right hand today.   CBC    Component Value Date/Time   WBC 14.4 (H) 09/10/2016 0539   RBC 3.04 (L) 09/10/2016 0539   HGB 9.1 (L) 09/10/2016 0539   HCT 29.1 (L) 09/10/2016 0539   HCT 47.6 05/01/2016 1001   PLT 212 09/10/2016 0539   PLT 230 05/01/2016 1001   MCV 95.7 09/10/2016 0539   MCV 93 05/01/2016 1001   MCH 29.9 09/10/2016 0539   MCHC 31.3 09/10/2016 0539   RDW 17.4 (H) 09/10/2016 0539   RDW 13.0 05/01/2016 1001   LYMPHSABS 2.0 09/10/2016 0539   LYMPHSABS 2.6 05/01/2016 1001   MONOABS 1.3 (H) 09/10/2016 0539   EOSABS 0.1 09/10/2016 0539   EOSABS 0.6 (H) 05/01/2016 1001   BASOSABS 0.0 09/10/2016 0539   BASOSABS 0.1 05/01/2016 1001    BMET    Component Value Date/Time   NA 146 (H) 09/10/2016 0537   NA 134 08/25/2016 1335   K 3.4 (L) 09/10/2016 0537   CL 103 09/10/2016 0537   CO2 34 (H) 09/10/2016 0537   GLUCOSE 138 (H) 09/10/2016 0537   BUN 47 (H) 09/10/2016 0537   BUN 26 08/25/2016 1335   CREATININE 2.00 (H) 09/10/2016 0537   CALCIUM 7.9 (L) 09/10/2016 0537   GFRNONAA 32 (L) 09/10/2016 0537   GFRAA 37 (L) 09/10/2016 0537    INR    Component Value Date/Time   INR 1.32 09/04/2016 1458     Intake/Output Summary (Last 24 hours) at 09/10/16 0817 Last data filed at 09/10/16 0700  Gross per 24 hour  Intake          2224.91 ml  Output              5170 ml  Net         -2945.09 ml    Venous duplex BUE:   Bilateral upper extremity venous duplex completed. Right upper extremity is negative for deep vein thrombosis, there is evidence of acute superficial vein thrombosis involving the right cephalic vein. Left upper extremity is negative for deep and superficial vein thrombosis.   Assessment/Plan:  71 y.o. male is s/p  #1: Endovascular repair of ruptured abdominal aortic aneurysm #2: Ultrasound-guided access, bilateral common femoral artery #3: Abdominal aortogram #4: Catheter aorta 2 #5: Bilateral iliac extension #6: Exploratory laparotomy #7: Evacuation of abdominal retroperitoneal hematoma #8: Placement of abdominal wound VAC 7Days Post-Op And  Abdominal exploration and abdominal wall closure 7 Days Post-Op  -Cardiac:  Had Afib with AVR last evening and placed on Cardizem gtt-now in NSR Pulmonary:  Vent wean per CCM Neuro:  Awake on vent; able to softly squeeze right hand today, which is improvement from yesterday.  Following commands and moving all other extremities; TEE pending (ordered) GI:  Continue TF's as tolerated; IV Protonix; -BM since 2/19  Renal:  Creatinine is up to 2 from 1.88; pt has good UOP ID:  Tm 100; still with elevated WBC; Blood cx x 2 no growth x 5 days (final) DVT prophylaxis: SCD's/Lovenox   [name], PA-C [facility] [phone] 09/10/2016 8:17 AM \ Agree with the above Squeezes right hand to command and moves right toes Continue with vent wean AFIB now in NSR, cardiology following Advance TF to goal  Durene Cal

## 2016-09-10 NOTE — Progress Notes (Signed)
*  PRELIMINARY RESULTS* Vascular Ultrasound Carotid Duplex (Doppler) has been completed.  Preliminary findings: Bilateral 1-39% ICA stenosis, antegrade vertebral flow.   Michael FischerCharlotte C Giana Heath 09/10/2016, 3:05 PM

## 2016-09-10 NOTE — Progress Notes (Signed)
eLink Physician-Brief Progress Note Patient Name: Michael AmenDavid T Heath DOB: Aug 13, 1945 MRN: [id number]   Date of Service  09/10/2016  HPI/Events of Note  K+ = 3.0 and Creatinine = 2.0.  eICU Interventions  Will replace K+.     Intervention Category Intermediate Interventions: Electrolyte abnormality - evaluation and management  Sommer,Steven Eugene 09/10/2016, 1:27 AM

## 2016-09-10 NOTE — Progress Notes (Signed)
STROKE TEAM PROGRESS NOTE   SUBJECTIVE (INTERVAL HISTORY) His wife and school friend are is at the bedside.  He remains intubated, though is able to understand conversation.   OBJECTIVE Temp:  [97.6 F (36.4 C)-100 F (37.8 C)] 99.7 F (37.6 C) (02/21 0900) Pulse Rate:  [47-144] 86 (02/21 1000) Cardiac Rhythm: Normal sinus rhythm (02/21 0800) Resp:  [15-24] 23 (02/21 1000) BP: (89-157)/(50-84) 144/81 (02/21 1000) SpO2:  [92 %-96 %] 93 % (02/21 1000) FiO2 (%):  [50 %-60 %] 50 % (02/21 0950) Weight:  [108.7 kg (239 lb 10.2 oz)] 108.7 kg (239 lb 10.2 oz) (02/21 0500)  CBC:  Recent Labs Lab 09/09/16 0501 09/10/16 0539  WBC 13.4* 14.4*  NEUTROABS 10.6* 11.0*  HGB 8.7* 9.1*  HCT 27.1* 29.1*  MCV 92.8 95.7  PLT 186 212    Basic Metabolic Panel:  Recent Labs Lab 09/09/16 0501  09/09/16 2338 09/10/16 0537 09/10/16 0539  NA 142  < > 145 146*  --   K 2.8*  < > 3.1* 3.4*  --   CL 103  < > 103 103  --   CO2 31  < > 34* 34*  --   GLUCOSE 130*  < > 139* 138*  --   BUN 41*  < > 47* 47*  --   CREATININE 1.88*  < > 2.01* 2.00*  --   CALCIUM 7.4*  < > 8.0* 7.9*  --   MG 1.8  --  2.2  --  2.2  PHOS 2.9  --   --   --  2.9  < > = values in this interval not displayed.   HgbA1c: No results found for: HGBA1C   PHYSICAL EXAM Elderly male intubated. Not sedated. . Afebrile. Head is nontraumatic. Neck is supple without bruit.    Cardiac exam no murmur or gallop. Lungs are clear to auscultation. Distal pulses are well felt. Neurological Exam ;  Intubated. Awake alert follows simple midline and one-step body commands. Eyes are in primary position. Follows gaze in all directions. Pupils equal reactive. Fundi could not be visualized. Blinks to threat bilaterally. Right lower facial weakness. Tongue is deviated to the right. Right hemiparesis grade 2/5 strength. Normal strength on the left side. Diminished on the right. Sensation appears to be preserved bilaterally. Deep tendon reflexes  depressed on the right compared to the left. Right plantar equivocal left downgoing. Gait not tested. ASSESSMENT/PLAN [name] is a 71 y.o. male with history of HTN, CKD, HLD who initially present to [facility] with flank pain. Taken to OR for ruptured AAA repair. Developed subsequent retroperitoneal bleeding requiring transfusion of pressure support. R sided weakness was noted as sedation was lifted. MRI revealed acute stroke. He was not a IV t-PA candidate due to unknown LKW, recent surgery.   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 punctate scattered R frontal, L inferior frontal, R medial temporal and R cerebellar infarcts.  MR CSpine C5-6 osteophyte complex with severe spinal canal stenosis and severe left and moderate right foraminal narrowing.  MRA - ? consider  Carotid Doppler  No significant stenosis, 1-39%   2D Echo  pending   TEE pending   LDL not calculated. Chol 106. TG 231  HgbA1c pending  Lovenox 40 mg sq daily for VTE prophylaxis  Diet NPO time specified  aspirin 81 mg daily prior to admission, now on No antithrombotic consider aspirin (multiple transfusions during OR, Hgb 9.1)  Therapy recommendations:  pending   Disposition:  pending   Atrial Fibrillation, new onset during hospitalization  Cardiology following. Resolved with IV diltiazem   Recommend aspirin when okay from surgical standpoint   Consider anticoagulation in the future for secondary stroke prevention  Hypertension  Stable  Permissive hypertension (OK if < 220/120) but gradually normalize in 5-7 days  Long-term BP goal normotensive  Hyperlipidemia  Home meds:  Zocor 20  Resume statin when able  LDL not calculated. Chol 106. TG 231  Continue statin at discharge  Other Stroke Risk Factors  Advanced  age  Cigarette smoker  Obesity, Body mass index is 31.62 kg/m.  Other Active Problems  Okay. Acute respiratory failure.  VDRF.   Likely COPD  Chronic kidney disease  Anemia of critical illness  Hospital day # 8  BIBY,SHARON  [facility] See Amion for Pager information 09/10/2016 3:33 PM passes rapidly through or so severe I have personally examined this patient, reviewed notes, independently viewed imaging studies, participated in medical decision making and plan of care.ROS completed by me personally and pertinent positives fully documented  I have made any additions or clarifications directly to the above note. Agree with note above. The patient has developed perioperative by cerebral infarcts with the largest one being in the left brain watershed distribution with resultant right hemiparesis. I expect slow improvement over the next few weeks to months. The patient has atrial fibrillation but is clearly not an anticoagulation candidate at the moment given his recent major surgery. Recommend start aspirin when safe as per vascular surgery team and may consider switching to anticoagulation later I had a long discussion of the bedside with the patient's wife and answered questions about prognosis and plan of care. Greater than 50% time during this 35 minute visit was spent on counseling and coordination of care about his stroke, evaluation and treatment plan and answering questions.  Delia Heady, MD Medical Director [facility] Pager: [phone] 09/10/2016 3:48 PM  To contact Stroke Continuity provider, please refer to [email or web address]. After hours, contact General Neurology

## 2016-09-11 ENCOUNTER — Inpatient Hospital Stay (HOSPITAL_COMMUNITY): Payer: Medicare Other

## 2016-09-11 DIAGNOSIS — I6330 Cerebral infarction due to thrombosis of unspecified cerebral artery: Secondary | ICD-10-CM | POA: Insufficient documentation

## 2016-09-11 LAB — CBC WITH DIFFERENTIAL/PLATELET
Basophils Absolute: 0 10*3/uL (ref 0.0–0.1)
Basophils Relative: 0 %
Eosinophils Absolute: 0.1 10*3/uL (ref 0.0–0.7)
Eosinophils Relative: 1 %
HCT: 27.4 % — ABNORMAL LOW (ref 39.0–52.0)
Hemoglobin: 8.6 g/dL — ABNORMAL LOW (ref 13.0–17.0)
Lymphocytes Relative: 12 %
Lymphs Abs: 1.5 10*3/uL (ref 0.7–4.0)
MCH: 30.6 pg (ref 26.0–34.0)
MCHC: 31.4 g/dL (ref 30.0–36.0)
MCV: 97.5 fL (ref 78.0–100.0)
Monocytes Absolute: 1.5 10*3/uL — ABNORMAL HIGH (ref 0.1–1.0)
Monocytes Relative: 12 %
Neutro Abs: 9.4 10*3/uL — ABNORMAL HIGH (ref 1.7–7.7)
Neutrophils Relative %: 75 %
Platelets: 224 10*3/uL (ref 150–400)
RBC: 2.81 MIL/uL — ABNORMAL LOW (ref 4.22–5.81)
RDW: 18.3 % — ABNORMAL HIGH (ref 11.5–15.5)
WBC: 12.5 10*3/uL — ABNORMAL HIGH (ref 4.0–10.5)

## 2016-09-11 LAB — GLUCOSE, CAPILLARY
GLUCOSE-CAPILLARY: 143 mg/dL — AB (ref 65–99)
GLUCOSE-CAPILLARY: 149 mg/dL — AB (ref 65–99)
GLUCOSE-CAPILLARY: 152 mg/dL — AB (ref 65–99)
GLUCOSE-CAPILLARY: 140 mg/dL — AB (ref 65–99)
Glucose-Capillary: 126 mg/dL — ABNORMAL HIGH (ref 65–99)
Glucose-Capillary: 127 mg/dL — ABNORMAL HIGH (ref 65–99)
Glucose-Capillary: 162 mg/dL — ABNORMAL HIGH (ref 65–99)
Glucose-Capillary: 169 mg/dL — ABNORMAL HIGH (ref 65–99)

## 2016-09-11 LAB — VAS US CAROTID
LCCADDIAS: -16 cm/s
LCCADSYS: -64 cm/s
LEFT ECA DIAS: -16 cm/s
LEFT VERTEBRAL DIAS: 17 cm/s
LICADSYS: -118 cm/s
LICAPSYS: -118 cm/s
Left CCA prox dias: 17 cm/s
Left CCA prox sys: 93 cm/s
Left ICA dist dias: -31 cm/s
Left ICA prox dias: -24 cm/s
RCCADSYS: -96 cm/s
RCCAPSYS: 107 cm/s
RIGHT ECA DIAS: -11 cm/s
RIGHT VERTEBRAL DIAS: -19 cm/s
Right CCA prox dias: 17 cm/s

## 2016-09-11 LAB — BASIC METABOLIC PANEL
ANION GAP: 8 (ref 5–15)
BUN: 53 mg/dL — ABNORMAL HIGH (ref 6–20)
CALCIUM: 8 mg/dL — AB (ref 8.9–10.3)
CHLORIDE: 105 mmol/L (ref 101–111)
CO2: 36 mmol/L — ABNORMAL HIGH (ref 22–32)
CREATININE: 2.03 mg/dL — AB (ref 0.61–1.24)
GFR calc non Af Amer: 31 mL/min — ABNORMAL LOW (ref >60)
GFR, EST AFRICAN AMERICAN: 36 mL/min — AB (ref >60)
Glucose, Bld: 156 mg/dL — ABNORMAL HIGH (ref 65–99)
Potassium: 2.6 mmol/L — CL (ref 3.5–5.1)
SODIUM: 149 mmol/L — AB (ref 135–145)

## 2016-09-11 LAB — PHOSPHORUS: Phosphorus: 2.8 mg/dL (ref 2.5–4.6)

## 2016-09-11 LAB — HEMOGLOBIN A1C
HEMOGLOBIN A1C: 5.2 % (ref 4.8–5.6)
Mean Plasma Glucose: 103

## 2016-09-11 LAB — MAGNESIUM: Magnesium: 2.3 mg/dL (ref 1.7–2.4)

## 2016-09-11 LAB — PROCALCITONIN: Procalcitonin: 0.54 ng/mL

## 2016-09-11 MED ORDER — DEXTROSE 5 % IV SOLN
2.0000 g | INTRAVENOUS | Status: DC
  Administered 2016-09-11 – 2016-09-12 (×2): 2 g via INTRAVENOUS
  Filled 2016-09-11 (×3): qty 2

## 2016-09-11 MED ORDER — ASPIRIN 325 MG PO TABS
325.0000 mg | ORAL_TABLET | Freq: Every day | ORAL | Status: DC
  Administered 2016-09-11: 325 mg
  Filled 2016-09-11 (×2): qty 1

## 2016-09-11 MED ORDER — SODIUM CHLORIDE 0.9 % IV SOLN
30.0000 meq | Freq: Once | INTRAVENOUS | Status: AC
  Administered 2016-09-11: 30 meq via INTRAVENOUS
  Filled 2016-09-11: qty 15

## 2016-09-11 MED ORDER — METOPROLOL TARTRATE 25 MG PO TABS
25.0000 mg | ORAL_TABLET | Freq: Three times a day (TID) | ORAL | Status: DC
  Administered 2016-09-11 (×2): 25 mg via NASOGASTRIC
  Filled 2016-09-11 (×3): qty 1

## 2016-09-11 NOTE — Progress Notes (Signed)
Pharmacy Antibiotic Note  Michael Heath is a 71 y.o. male continues on antibiotics for pneumonia. WBC has trended down from 22>12.5 and patient has remained afebrile for approximately 72 hours. Renal function remains stable with sCr 1.7-1.9. Vancomycin has been discontinued as of today.   Plan: Cefepime 2G IV q24 hours F/U length of therapy   Height: 6\' 1"  (185.4 cm) Weight: 231 lb 0.7 oz (104.8 kg) IBW/kg (Calculated) : 79.9  Temp (24hrs), Avg:99.6 F (37.6 C), Min:98.9 F (37.2 C), Max:100.3 F (37.9 C)   Recent Labs Lab 09/04/16 1459 09/04/16 2115  09/06/16 1530 09/07/16 0456  09/08/16 0318 09/08/16 0320 09/09/16 0501 09/09/16 1720 09/09/16 2338 09/10/16 0537 09/10/16 0539 09/11/16 0400  WBC  --   --   < >  --  13.3*  --  14.5*  --  13.4*  --   --   --  14.4* 12.5*  CREATININE  --   --   < >  --  1.86*  < > 1.84*  --  1.88* 1.98* 2.01* 2.00*  --   --   LATICACIDVEN 1.4 1.4  --   --   --   --   --  1.7  --   --   --   --   --   --   VANCOTROUGH  --   --   --  15  --   --   --   --   --   --   --   --   --   --   < > = values in this interval not displayed.  Estimated Creatinine Clearance: 43.1 mL/min (by C-G formula based on SCr of 2 mg/dL (H)).    No Known Allergies  Antimicrobials this admission: 2/15 Vanc>>2/20 2/17 VT = 15 on 750mg  q12 2/15 Zosyn>>2/22 2/22 Cefepime>>  Dose adjustments this admission:  Microbiology results: 2/14 resp>> normal flora 2/15 blood x2>> ngtd  Thank you for allowing pharmacy to be a part of this patient's care.  Ruben Imony Alexismarie Flaim, PharmD Clinical Pharmacist Pager: [phone] 09/11/2016 10:20 AM

## 2016-09-11 NOTE — Progress Notes (Signed)
CRITICAL VALUE ALERT  Critical value received:  Potassium 2.6  Date of notification:  09/11/2016  Time of notification:  1600  Critical value read back: yes  Nurse who received alert: Andreas Bloweraroline Fowler RN  MD notified (1st page): Dr. Craige CottaSood  Time of first page: 1610  Responding MD:  Dr. Craige CottaSood  Time MD responded:  1610  Per MD will put the orders in.

## 2016-09-11 NOTE — Progress Notes (Signed)
STROKE TEAM PROGRESS NOTE   SUBJECTIVE (INTERVAL HISTORY) His wife  is at the bedside.  He remains intubated,but  is able to understand conversation and follow commands. He is back in afib with RVR.   OBJECTIVE Temp:  [98 F (36.7 C)-100.3 F (37.9 C)] 98 F (36.7 C) (02/22 1143) Pulse Rate:  [69-109] 109 (02/22 1200) Cardiac Rhythm: Atrial fibrillation (02/22 1200) Resp:  [15-32] 27 (02/22 1200) BP: (103-156)/(56-96) 125/62 (02/22 1200) SpO2:  [86 %-100 %] 95 % (02/22 1200) FiO2 (%):  [40 %-50 %] 40 % (02/22 1059) Weight:  [231 lb 0.7 oz (104.8 kg)] 231 lb 0.7 oz (104.8 kg) (02/22 0500)  CBC:   Recent Labs Lab 09/10/16 0539 09/11/16 0400  WBC 14.4* 12.5*  NEUTROABS 11.0* 9.4*  HGB 9.1* 8.6*  HCT 29.1* 27.4*  MCV 95.7 97.5  PLT 212 224    Basic Metabolic Panel:   Recent Labs Lab 09/09/16 2338 09/10/16 0537 09/10/16 0539 09/11/16 0400  NA 145 146*  --   --   K 3.1* 3.4*  --   --   CL 103 103  --   --   CO2 34* 34*  --   --   GLUCOSE 139* 138*  --   --   BUN 47* 47*  --   --   CREATININE 2.01* 2.00*  --   --   CALCIUM 8.0* 7.9*  --   --   MG 2.2  --  2.2 2.3  PHOS  --   --  2.9 2.8     HgbA1c:  Lab Results  Component Value Date   HGBA1C 5.2 09/10/2016     PHYSICAL EXAM Elderly male intubated. Not sedated. . Afebrile. Head is nontraumatic. Neck is supple without bruit.    Cardiac exam no murmur or gallop. Lungs are clear to auscultation. Distal pulses are well felt. Neurological Exam ;  Intubated. Awake alert follows simple midline and one-step body commands. Eyes are in primary position. Follows gaze in all directions. Pupils equal reactive. Fundi could not be visualized. Blinks to threat bilaterally. Right lower facial weakness. Tongue is deviated to the right. Right hemiparesis grade 2/5 strength. Normal strength on the left side. Diminished on the right. Sensation appears to be preserved bilaterally. Deep tendon reflexes depressed on the right compared  to the left. Right plantar equivocal left downgoing. Gait not tested. ASSESSMENT/PLAN [name] is a 71 y.o. male with history of HTN, CKD, HLD who initially present to [facility] with flank pain. Taken to OR for ruptured AAA repair. Developed subsequent retroperitoneal bleeding requiring transfusion of pressure support. R sided weakness was noted as sedation was lifted. MRI revealed acute stroke. He was not a IV t-PA candidate due to unknown LKW, recent surgery.   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 punctate scattered R frontal, L inferior frontal, R medial temporal and R cerebellar infarcts.  MR CSpine C5-6 osteophyte complex with severe spinal canal stenosis and severe left and moderate right foraminal narrowing.  MRA - ? consider  Carotid Doppler  No significant stenosis, 1-39%  2D Echo  2/15/18Left ventricle: The cavity size was normal. There was mild   concentric hypertrophy. Systolic function was normal. The   estimated ejection fraction was in the range of 60% to 65%. Wall   motion was normal;  there were no regional wall motion    abnormalities   TEE pending   LDL not calculated. Chol 106. TG 231  HgbA1c 5.2  Lovenox 40 mg sq daily for VTE prophylaxis Diet NPO time specified  aspirin 81 mg daily prior to admission, now on No antithrombotic Could not be calculated aspirin if ok with Vascular surgery for 1 wek then eliquis when able to swallow for long term anticoagulation  Therapy recommendations:  pending   Disposition:  pending   Atrial Fibrillation, new onset during hospitalization  Cardiology following. Resolved with IV diltiazem   Recommend aspirin when okay from surgical standpoint   Consider anticoagulation in the future for secondary stroke  prevention  Hypertension  Stable  Permissive hypertension (OK if < 220/120) but gradually normalize in 5-7 days  Long-term BP goal normotensive  Hyperlipidemia  Home meds:  Zocor 20  Resume statin when able  LDL not calculated. Chol 106. TG 231  Continue statin at discharge  Other Stroke Risk Factors  Advanced age  Cigarette smoker  Obesity, Body mass index is 30.48 kg/m.  Other Active Problems  Okay. Acute respiratory failure.  VDRF.   Likely COPD  Chronic kidney disease  Anemia of critical illness  Hospital day # 9    I have personally examined this patient, reviewed notes, independently viewed imaging studies, participated in medical decision making and plan of care.ROS completed by me personally and pertinent positives fully documented  I have made any additions or clarifications directly to the above note. Agree with note above. The patient has developed perioperative by cerebral infarcts with the largest one being in the left brain watershed distribution with resultant right hemiparesis. I expect slow improvement over the next few weeks to months. The patient has atrial fibrillation but is clearly not an anticoagulation candidate at the moment given his recent major surgery. Recommend start aspirin after d/w   vascular surgery team and  consider switching to anticoagulation 1 week  later I had a long discussion of the bedside with the patient's wife and answered questions about prognosis and plan of care. Dr. Myra Gianotti who is in agreement. Greater than 50% time during this 25 minute visit was spent on counseling and coordination of care about his stroke, evaluation and treatment plan and answering questions. Stroke team will sign off. Kindly call for questions. Follow-up as an outpatient in stroke clinic in 6 weeks  Delia Heady, MD Medical Director [facility] Pager: [phone] 09/11/2016 12:56 PM  To contact Stroke Continuity provider, please refer  to [email or web address]. After hours, contact General Neurology

## 2016-09-11 NOTE — Progress Notes (Signed)
Progress Note  Patient Name: Michael Heath Date of Encounter: 09/11/2016  Primary Cardiologist: Linard Millers   Subjective   71 yo with hx of CAD , HTN and ruptured AAA Now , s/p repair Developed atrial fib     Inpatient Medications    Scheduled Meds: . aspirin  325 mg Per Tube Daily  . ceFEPime (MAXIPIME) IV  2 g Intravenous Q24H  . chlorhexidine gluconate (MEDLINE KIT)  15 mL Mouth Rinse BID  . Chlorhexidine Gluconate Cloth  6 each Topical Daily  . docusate  100 mg Oral Daily  . enoxaparin (LOVENOX) injection  40 mg Subcutaneous Q24H  . feeding supplement (PRO-STAT SUGAR FREE 64)  60 mL Per Tube BID  . furosemide  80 mg Intravenous Q8H  . mouth rinse  15 mL Mouth Rinse QID  . metoprolol tartrate  25 mg Per NG tube BID  . pantoprazole (PROTONIX) IV  40 mg Intravenous Q12H  . sodium chloride flush  10-40 mL Intracatheter Q12H   Continuous Infusions: . sodium chloride 10 mL/hr at 09/11/16 0800  . dilTIAZem HCl-Dextrose 10 mg/hr (09/11/16 0800)  . feeding supplement (VITAL 1.5 CAL) 1,000 mL (09/11/16 0800)   PRN Meds: sodium chloride, albuterol, artificial tears, bisacodyl, fentaNYL (SUBLIMAZE) injection, hydrALAZINE, labetalol, midazolam, ondansetron, phenol, potassium chloride, sodium chloride flush   Vital Signs    Vitals:   09/11/16 1005 09/11/16 1059 09/11/16 1100 09/11/16 1143  BP: (!) 122/96  (!) 116/57   Pulse: 95  90   Resp: (!) 30  (!) 24   Temp:    98 F (36.7 C)  TempSrc:    Oral  SpO2: 92% 92% 93%   Weight:      Height:        Intake/Output Summary (Last 24 hours) at 09/11/16 1213 Last data filed at 09/11/16 1102  Gross per 24 hour  Intake          1752.58 ml  Output             4050 ml  Net         -2297.42 ml   Filed Weights   09/09/16 0600 09/10/16 0500 09/11/16 0500  Weight: 239 lb 13.8 oz (108.8 kg) 239 lb 10.2 oz (108.7 kg) 231 lb 0.7 oz (104.8 kg)    Telemetry    NSR - Personally Reviewed  ECG    NSR  - Personally  Reviewed  Physical Exam   GEN:  intubated, asleep No acute distress.   Neck: No JVD Cardiac: irreg. Irreg. , no murmurs, rubs, or gallops.  Respiratory: rhonchi and wheezes bilaterally , on the vent  GI: Soft, nontender, non-distended  MS: No edema; No deformity. Neuro:  unable to assess  Psych: unable to assess   Labs    Chemistry Recent Labs Lab 09/04/16 1458  09/08/16 0318 09/09/16 0501 09/09/16 1720 09/09/16 2338 09/10/16 0537  NA 142  < > 144 142 145 145 146*  K 4.0  < > 3.6 2.8* 3.4* 3.1* 3.4*  CL 107  < > 106 103 102 103 103  CO2 27  < > 29 31 33* 34* 34*  GLUCOSE 111*  < > 142* 130* 140* 139* 138*  BUN 22*  < > 37* 41* 46* 47* 47*  CREATININE 1.70*  < > 1.84* 1.88* 1.98* 2.01* 2.00*  CALCIUM 7.0*  < > 7.8* 7.4* 8.0* 8.0* 7.9*  PROT 4.9*  --  5.3* 5.4*  --   --   --  ALBUMIN 2.0*  --  1.5* 1.4*  --   --   --   AST 29  --  85* 82*  --   --   --   ALT 18  --  32 35  --   --   --   ALKPHOS 38  --  42 50  --   --   --   BILITOT 3.7*  --  5.9* 4.7*  --   --   --   GFRNONAA 39*  < > 35* 34* 32* 32* 32*  GFRAA 45*  < > 41* 40* 37* 37* 37*  ANIONGAP 8  < > _0 < > = values in this interval not displayed.   Hematology  Recent Labs Lab 09/09/16 0501 09/10/16 0539 09/11/16 0400  WBC 13.4* 14.4* 12.5*  RBC 2.92* 3.04* 2.81*  HGB 8.7* 9.1* 8.6*  HCT 27.1* 29.1* 27.4*  MCV 92.8 95.7 97.5  MCH 29.8 29.9 30.6  MCHC 32.1 31.3 31.4  RDW 16.9* 17.4* 18.3*  PLT 186 212 224    Cardiac Enzymes  Recent Labs Lab 09/07/16 2128 09/08/16 0318 09/09/16 0501  TROPONINI <0.03 <0.03 <0.03   No results for input(s): TROPIPOC in the last 168 hours.   BNPNo results for input(s): BNP, PROBNP in the last 168 hours.   DDimer No results for input(s): DDIMER in the last 168 hours.   Radiology    Dg Chest Port 1 View  Result Date: 09/10/2016 CLINICAL DATA:  Followup ventilator support. EXAM: PORTABLE CHEST 1 VIEW COMPARISON:  09/08/2016 FINDINGS: Endotracheal  tube tip is 4 cm above the carina. Nasogastric tube enters the abdomen. There is less edema. Lower lobe volume loss left more than right persists. Left arm PICC tip again seen at the SVC RA junction. IMPRESSION: Less edema.  Persistent lower lobe volume loss left more than right. Electronically Signed   By: Nelson Chimes M.D.   On: 09/10/2016 07:43    Cardiac Studies   Tele :   Paroxysmal atrial fib   Patient Profile     71 y.o. male with ruptured AAA .     Assessment & Plan    1. PAF :  Has had some atrial fib at night. Has resolved with IV dilt But is now back in AF  Will increase metoprolol to 25 mg per tube TID  Continue dilt, increase drip as needed     Signed, Mertie Moores, MD  09/11/2016, 12:13 PM

## 2016-09-11 NOTE — Progress Notes (Signed)
eLink Physician-Brief Progress Note Patient Name: Michael AmenDavid T Beitz DOB: 1945/11/08 MRN: [id number]   Date of Service  09/11/2016  HPI/Events of Note  BMP Latest Ref Rng & Units 09/11/2016 09/10/2016 09/09/2016  Glucose 65 - 99 mg/dL 119(J156(H) 478(G138(H) 956(O139(H)  BUN 6 - 20 mg/dL 13(Y53(H) 86(V47(H) 78(I47(H)  Creatinine 0.61 - 1.24 mg/dL 6.96(E2.03(H) 9.52(W2.00(H) 4.13(K2.01(H)  BUN/Creat Ratio 10 - 24 - - -  Sodium 135 - 145 mmol/L 149(H) 146(H) 145  Potassium 3.5 - 5.1 mmol/L 2.6(LL) 3.4(L) 3.1(L)  Chloride 101 - 111 mmol/L 105 103 103  CO2 22 - 32 mmol/L 36(H) 34(H) 34(H)  Calcium 8.9 - 10.3 mg/dL 8.0(L) 7.9(L) 8.0(L)     eICU Interventions  Replace K IV.     Intervention Category Major Interventions: Other:  Britten Parady 09/11/2016, 4:12 PM

## 2016-09-11 NOTE — Progress Notes (Signed)
Wasted 100 of Fentanyl in sink with Art therapistJamie RN.

## 2016-09-11 NOTE — Progress Notes (Signed)
[facility] Progress Note  Subjective    Awake on vent. Pointing towards his left groin.   Objective Vitals:   09/11/16 0831 09/11/16 0900  BP:  (!) 156/66  Pulse:  80  Resp:  (!) 24  Temp: 99.1 F (37.3 C)     Intake/Output Summary (Last 24 hours) at 09/11/16 0956 Last data filed at 09/11/16 0900  Gross per 24 hour  Intake          1632.58 ml  Output             3600 ml  Net         -1967.42 ml   Moving right arm.  Abdomen soft.  Serous drainage left groin.  Wiggling toes bilaterally.  2+ DP pulses bilaterally.    Assessment/Planning: 71 y.o. male is s/p: endovascular repair of ruptured aneurysm, exploratory laparotomy 8 Days Post-Op   CVA: Starting to move right upper extremity. Moves toes on right.  Was in afib, now in NSR.  Anticoagulation plans per neuro, cardiology.  No carotid stenosis on duplex. Continue tube feeds.  Vent wean per PCCM.   Appreciate CCM, neurology and cardiology assistance with this patient.   Michael Heath 09/11/2016 9:56 AM --  Laboratory CBC    Component Value Date/Time   WBC 12.5 (H) 09/11/2016 0400   HGB 8.6 (L) 09/11/2016 0400   HCT 27.4 (L) 09/11/2016 0400   HCT 47.6 05/01/2016 1001   PLT 224 09/11/2016 0400   PLT 230 05/01/2016 1001    BMET    Component Value Date/Time   NA 146 (H) 09/10/2016 0537   NA 134 08/25/2016 1335   K 3.4 (L) 09/10/2016 0537   CL 103 09/10/2016 0537   CO2 34 (H) 09/10/2016 0537   GLUCOSE 138 (H) 09/10/2016 0537   BUN 47 (H) 09/10/2016 0537   BUN 26 08/25/2016 1335   CREATININE 2.00 (H) 09/10/2016 0537   CALCIUM 7.9 (L) 09/10/2016 0537   GFRNONAA 32 (L) 09/10/2016 0537   GFRAA 37 (L) 09/10/2016 0537    COAG Lab Results  Component Value Date   INR 1.32 09/04/2016   INR 1.41 02-Oct-2016   INR 1.22 10-02-16   No results found for: PTT  Antibiotics Anti-infectives    Start     Dose/Rate Route Frequency Ordered Stop   09/04/16 1600  vancomycin (VANCOCIN)  IVPB 750 mg/150 ml premix  Status:  Discontinued     750 mg 150 mL/hr over 60 Minutes Intravenous Every 12 hours 09/04/16 1451 09/09/16 0850   09/04/16 1500  piperacillin-tazobactam (ZOSYN) IVPB 3.375 g  Status:  Discontinued     3.375 g 12.5 mL/hr over 240 Minutes Intravenous Every 8 hours 09/04/16 1451 09/11/16 0908   Oct 02, 2016 1900  cefUROXime (ZINACEF) 1.5 g in dextrose 5 % 50 mL IVPB  Status:  Discontinued     1.5 g 100 mL/hr over 30 Minutes Intravenous  Once Oct 02, 2016 1849 09/11/16 0908   09/15/2016 2015  cefUROXime (ZINACEF) 1.5 g in dextrose 5 % 50 mL IVPB     1.5 g 100 mL/hr over 30 Minutes Intravenous Every 12 hours 09/13/2016 2006 10-02-16 1013       Michael Berger, PA-C [facility] Office: [phone] Pager: [phone] 09/11/2016 9:56 AM    Agree with the above.  I have seen and examined the patient GI: TF to goal Pulm:  CCM managing vent.  Discussed possibility of trach with wife if he can not wean.  PEEP down to 8.  Did not tolerate wean yesterday. CV:  AFIB now in sinus, cardiology increasing Beta blocker CVA:  Appreciate neurology assistance.  Discussed with Dr. Pearlean Michael Heath.  Will add ASA and plan for NOAC in about 1 week.  May require heparin IV at that time if not appropriate to start NOAC ID:  On Abx for pulm issues/pneumonia Prophylaxis:  PPI and SQ lovenox  Wells Michael Heath

## 2016-09-11 NOTE — Progress Notes (Signed)
PULMONARY / CRITICAL CARE MEDICINE   Name: Michael Heath MRN: [id number] DOB: 1945/08/19    ADMISSION DATE:  08/29/2016 CONSULTATION DATE:  09/01/2016  REFERRING MD:  Coral Else MD  CHIEF COMPLAINT:  Ruptured AAA aneurysm status post repair, acute hypoxic respiratory failure.  brief 71 year old with past medical history of hypertension, hyperlipidemia, CKD, stage III. Admitted today with right flank, back pain. A CTA which showed ruptured AAA. He was transferred emergently to [facility] for stent repair. In the OR he received 16 units PRBC, 10 units FFP, 3 units platelets, 1 unit cryo and 1 Cell Saver. He developed abdominal comparment syndrome in OR from massive RP bleed. He is brought back to the ICU with an open abdomen. PCCM consulted for vent, sedation management.  CT Angio chest, abdomen 09/04/2016- ruptured 7.8 cm abdominal aortic aneurysm with a large RP hematoma. Moderate emphysema. No focal pneumonia, consolidation, opacities. CXR 09/01/16- COPD, no active cardio pulmonary disease All images personally reviewed. Echo 2/15 > N EF, diastolic dysfxn  LINES/TUBES: ETT 2/13 Rt IJ cordis 2/13  SIGNIFICANT EVENTS: 2/13- Admit, OR for ruptured AAA. 2/14- Back to OR for abdomina closure.  2/18 -   Weaned off pressors 2/17 . B/p stable this am  .  Still not following commands, limited responsiveness  Versed stopped this am . Weaning fentanyl -increased wob  Wife at bedside , updated.  Trickle feeds started 2/17  2./19 - RUE weakness overnight; CT head negative. Off pressors. Volume overloaded. Wife and grandson at bedside. RASS -3 off sedation gtt x 1h   2/20 - down to 60% fio2, peep 10. RUE improved. Coming down in sedation gtt. Less volume overloaded. SVT + in RUE  09/10/2016 - MRI with water shet infarct. Uanble to tolerate prn sedation. Diuresing. Still 11L positive. Back in A Fib RVR and on cadizem. Cards/neuro recommendeding anticooagulation when more stable after surgery. ASA  only now. Bialteral 1-39% CEA stenosis on [place]   SUBJECTIVE/OVERNIGHT/INTERVAL HX 09/11/16 - volumes status down to 4L Positive   VITAL SIGNS: BP 130/60 (BP Location: Left Leg)   Pulse 69   Temp 99.1 F (37.3 C) (Oral)   Resp (!) 23   Ht 6\' 1"  (1.854 m)   Wt 104.8 kg (231 lb 0.7 oz)   SpO2 92%   BMI 30.48 kg/m   HEMODYNAMICS:    VENTILATOR SETTINGS: Vent Mode: PRVC FiO2 (%):  [40 %-50 %] 40 % Set Rate:  [22 bmp] 22 bmp Vt Set:  [640 mL] 640 mL PEEP:  [8 cmH20-10 cmH20] 8 cmH20 Pressure Support:  [10 cmH20] 10 cmH20 Plateau Pressure:  [18 cmH20-21 cmH20] 20 cmH20  INTAKE / OUTPUT: I/O last 3 completed shifts: In: 2705 [I.V.:913.3; NG/GT:1326.7; IV Piggyback:465] Out: 6600 [Urine:6600]  PHYSICAL EXAMINATION:   General Appearance:    Looks criticall ill OBESE - no  Head:    Normocephalic, without obvious abnormality, atraumatic  Eyes:    PERRL - yes, conjunctiva/corneas - muddy as alway      Ears:    Normal external ear canals, both ears  Nose:   NG tube - no  Throat:  ETT TUBE - yes , OG tube - yes   Neck:   Supple,  No enlargement/tenderness/nodules     Lungs:     Clear to auscultation bilaterally, Ventilator   Synchrony - yes on 40% and peep 8  Chest wall:    No deformity  Heart:    S1 and S2 normal, no murmur, CVP -  na.  Pressors - off  Abdomen:     Soft, no masses, no organomegaly  Genitalia:    Not done  Rectal:   not done  Extremities:   Extremities- improved edema . RUE more swollent     Skin:   Intact in exposed areas . Sacral area - none     Neurologic:   Sedation - prn -> RASS - +1 . Moves all 4s - yes but RUE weaker. CAM-ICU - neg for delirium. Orientation - appears to be        LABS:   PULMONARY  Recent Labs Lab 09/04/16 1500 09/06/16 0430  PHART 7.418 7.357  PCO2ART 47.4 48.0  PO2ART 59.0* 66.3*  HCO3 30.6* 26.0  TCO2 32  --   O2SAT 90.0 90.9    CBC  Recent Labs Lab 09/09/16 0501 09/10/16 0539 09/11/16 0400  HGB 8.7* 9.1*  8.6*  HCT 27.1* 29.1* 27.4*  WBC 13.4* 14.4* 12.5*  PLT 186 212 224    COAGULATION  Recent Labs Lab 09/04/16 1458  INR 1.32    CARDIAC    Recent Labs Lab 09/07/16 2128 09/08/16 0318 09/09/16 0501  TROPONINI <0.03 <0.03 <0.03   No results for input(s): PROBNP in the last 168 hours.   CHEMISTRY  Recent Labs Lab 09/07/16 0456  09/08/16 1610 09/09/16 0501 09/09/16 1720 09/09/16 2338 09/10/16 0537 09/10/16 0539 09/11/16 0400  NA 143  < > 144 142 145 145 146*  --   --   K 3.6  < > 3.6 2.8* 3.4* 3.1* 3.4*  --   --   CL 109  < > 106 103 102 103 103  --   --   CO2 28  < > 29 31 33* 34* 34*  --   --   GLUCOSE 130*  < > 142* 130* 140* 139* 138*  --   --   BUN 33*  < > 37* 41* 46* 47* 47*  --   --   CREATININE 1.86*  < > 1.84* 1.88* 1.98* 2.01* 2.00*  --   --   CALCIUM 7.8*  < > 7.8* 7.4* 8.0* 8.0* 7.9*  --   --   MG 1.8  < > 2.1 1.8  --  2.2  --  2.2 2.3  PHOS 3.1  --  2.7 2.9  --   --   --  2.9 2.8  < > = values in this interval not displayed. Estimated Creatinine Clearance: 43.1 mL/min (by C-G formula based on SCr of 2 mg/dL (H)).   LIVER  Recent Labs Lab 09/04/16 1458 09/08/16 0318 09/09/16 0501  AST 29 85* 82*  ALT 18 32 35  ALKPHOS 38 42 50  BILITOT 3.7* 5.9* 4.7*  PROT 4.9* 5.3* 5.4*  ALBUMIN 2.0* 1.5* 1.4*  INR 1.32  --   --      INFECTIOUS  Recent Labs Lab 09/04/16 1459 09/04/16 2115 09/08/16 0320 09/10/16 0539 09/11/16 0400  LATICACIDVEN 1.4 1.4 1.7  --   --   PROCALCITON  --   --   --  0.91 0.54     ENDOCRINE CBG (last 3)   Recent Labs  09/11/16 0003 09/11/16 0401 09/11/16 0828  GLUCAP 162* 143* 149*         IMAGING x48h  - image(s) personally visualized  -   highlighted in bold Dg Chest Port 1 View  Result Date: 09/10/2016 CLINICAL DATA:  Followup ventilator support. EXAM: PORTABLE CHEST 1 VIEW COMPARISON:  09/08/2016  FINDINGS: Endotracheal tube tip is 4 cm above the carina. Nasogastric tube enters the abdomen.  There is less edema. Lower lobe volume loss left more than right persists. Left arm PICC tip again seen at the SVC RA junction. IMPRESSION: Less edema.  Persistent lower lobe volume loss left more than right. Electronically Signed   By: Paulina Fusi M.D.   On: 09/10/2016 07:43     DISCUSSION: Patient admitted  for acute AAA rupture and had endovascular repair of ruptured AAA and ex-lap for abdominal compartment syndrome. Abdomen was kept open after procedure then kept intubated post op. Patient with hypoxemia and resp failure  after receiveing significant amounts of blood products. Pt went back to OR on 2/14 for abdominal closure. Remains on the vent.    Patient Active Problem List   Diagnosis Date Noted  . Acute hypoxemic respiratory failure (HCC)   . AKI (acute kidney injury) (HCC)   . Dysphagia   . Elevated troponin   . Coronary artery calcification seen on CAT scan   . Ruptured abdominal aortic aneurysm (AAA) (HCC) 08/23/2016  . TIA (transient ischemic attack) 09/01/2016  . CKD (chronic kidney disease), stage III 01/28/2016  . HLD (hyperlipidemia) 12/27/2015  . Vitamin D deficiency 12/27/2015  . Elevated blood pressure reading without diagnosis of hypertension 12/25/2015  . Obesity 12/25/2015  . Healthcare maintenance 12/25/2015      ASSESSMENT / PLAN:  PULMONARY A: #baseline: heavy smoker, likely has copd #current: Acute resp failure, following aaa rutpture  09/11/2016 -> begins to meet sbt criteria with help of diuresis  P:   SBT but no extubation Full vent support otherwise Check CXR  Post et tube advancement  CARDIOVASCULAR A:  #baseline: cad risk factors + #current AAA ruprutre at this admit with repair and closure 2/20 - RUE SVT + and new recurrent A Fibon cardizemg tt  09/11/2016 -> off peroessr. AFib but now in NSR  P:  Map goal > 65 Continue cardizem gtt; cards to decide on dc IV heparin gtt; cards deciding on timing  RENAL  Intake/Output Summary  (Last 24 hours) at 09/11/16 0856 Last data filed at 09/11/16 0847  Gross per 24 hour  Intake          1622.58 ml  Output             3600 ml  Net         -1977.42 ml    Recent Labs Lab 09/08/16 0318 09/09/16 0501 09/09/16 1720 09/09/16 2338 09/10/16 0537  CREATININE 1.84* 1.88* 1.98* 2.01* 2.00*     A:   #baseline: ckd #current Creat 2.0mg  and rising with lasix. Mild low k repleted  09/11/2016 - only 4L volume positive.   P:   Monitor with lasix  GASTROINTESTINAL A:   On TF Had difficulty with placement of OG tube   P:   Continue TF PPI   HEMATOLOGIC  Recent Labs Lab 09/09/16 0501 09/10/16 0539 09/11/16 0400  HGB 8.7* 9.1* 8.6*  HCT 27.1* 29.1* 27.4*  WBC 13.4* 14.4* 12.5*  PLT 186 212 224    A:   #RBC: anemia of critical illness - stable anemia #Platelet ok #WBC stable leukocytosis  P:  - PRBC for hgb </= 6.9gm%    - exceptions are   -  if ACS susepcted/confirmed then transfuse for hgb </= 8.0gm%,  or    -  active bleeding with hemodynamic instability, then transfuse regardless of hemoglobin value   At at all times  try to transfuse 1 unit prbc as possible with exception of active hemorrhage  - Continue lovenox 40mg  SQ  INFECTIOUS  Recent Labs Lab 09/10/16 0539 09/11/16 0400  PROCALCITON 0.91 0.54    Results for orders placed or performed during the hospital encounter of 09/06/2016  MRSA PCR Screening     Status: None   Collection Time: 08/26/2016  8:47 PM  Result Value Ref Range Status   MRSA by PCR NEGATIVE NEGATIVE Final    Comment:        The GeneXpert MRSA Assay (FDA approved for NASAL specimens only), is one component of a comprehensive MRSA colonization surveillance program. It is not intended to diagnose MRSA infection nor to guide or monitor treatment for MRSA infections.   Culture, respiratory (NON-Expectorated)     Status: None   Collection Time: 08/26/2016 12:24 PM  Result Value Ref Range Status   Specimen Description  TRACHEAL ASPIRATE  Final   Special Requests Normal  Final   Gram Stain   Final    FEW WBC PRESENT,BOTH PMN AND MONONUCLEAR FEW SQUAMOUS EPITHELIAL CELLS PRESENT NO ORGANISMS SEEN    Culture Consistent with normal respiratory flora.  Final   Report Status 09/06/2016 FINAL  Final  Culture, blood (routine x 2)     Status: None   Collection Time: 09/04/16  4:11 PM  Result Value Ref Range Status   Specimen Description BLOOD LEFT HAND  Final   Special Requests IN PEDIATRIC BOTTLE 0.5CC  Final   Culture NO GROWTH 5 DAYS  Final   Report Status 09/09/2016 FINAL  Final  Culture, blood (routine x 2)     Status: None   Collection Time: 09/04/16  4:26 PM  Result Value Ref Range Status   Specimen Description BLOOD RIGHT ANTECUBITAL  Final   Special Requests BOTTLES DRAWN AEROBIC ONLY 5CC  Final   Culture NO GROWTH 5 DAYS  Final   Report Status 09/09/2016 FINAL  Final    A:   Possible Aspiration v HCAP  2/21- low grade fever + 2/22/ - fever improving P:   chjeck PCT Anti-infectives    Start     Dose/Rate Route Frequency Ordered Stop   09/04/16 1600  vancomycin (VANCOCIN) IVPB 750 mg/150 ml premix  Status:  Discontinued     750 mg 150 mL/hr over 60 Minutes Intravenous Every 12 hours 09/04/16 1451 09/09/16 0850   09/04/16 1500  piperacillin-tazobactam (ZOSYN) IVPB 3.375 g  Status:  Discontinued     3.375 g 12.5 mL/hr over 240 Minutes Intravenous Every 8 hours 09/04/16 1451 09/11/16 0908   09/02/2016 1900  cefUROXime (ZINACEF) 1.5 g in dextrose 5 % 50 mL IVPB  Status:  Discontinued     1.5 g 100 mL/hr over 30 Minutes Intravenous  Once 09/01/2016 1849 09/11/16 0908   09/12/2016 2015  cefUROXime (ZINACEF) 1.5 g in dextrose 5 % 50 mL IVPB     1.5 g 100 mL/hr over 30 Minutes Intravenous Every 12 hours 09/05/2016 2006 09/03/16 1013       ENDOCRINE A:   hyoperglicyema P:   ICU hyperglycemia protocol  NEUROLOGIC A:   #Baseline : negative CNS/Psych hx  #Current  - acute agitated  encephalopathy post intubation - resolved 09/10/16  - New RUE weakness with watershed infacrt in Rt carotid terroty 09/09/16   09/11/2016 -> improving rue weakness and resp;ved encephaloapthy. Doing well off sedation gtt P:   RASS goal: 0  dc fent gtt Start precedex gtt if needed Jonne Ply  81mg   Anticiog for a fib - timing per cards/neuro Versed orn   FAMILY  - Updates: 09/11/2016 --> wife will be  updated 09/11/2016   - Inter-disciplinary family meet or Palliative Care meeting due by:  DAy 7. Current LOS is LOS 9 days - sdone 2/201/8 whic was day 7 - full medical care and full code   DISPO Keep in ICU   The patient is critically ill with multiple organ systems failure and requires high complexity decision making for assessment and support, frequent evaluation and titration of therapies, application of advanced monitoring technologies and extensive interpretation of multiple databases.   Critical Care Time devoted to patient care services described in this note is  30  Minutes. This time reflects time of care of this signee Dr Kalman ShanMurali Shanah Guimaraes. This critical care time does not reflect procedure time, or teaching time or supervisory time of PA/NP/Med student/Med Resident etc but could involve care discussion time    Dr. Kalman ShanMurali Jaquawn Saffran, M.D., [facility].P Pulmonary and Critical Care Medicine Staff Physician [facility] [place] [facility] Pager: [phone], If no answer or between  15:00h - 7:00h: call [phone]  09/11/2016 8:56 AM          PULMONARY A: #baseline:  reports that he has been smoking Cigarettes.  He has a 82.50 pack-year smoking history. He uses smokeless tobacco. l likely has copd  #current: acute resp failure to AAA rupture  09/11/2016 -> Does not meet sbt criteira due to sedation gtt and high fio2 /peep  P:   Full vent support  CARDIOVASCULAR A:  #baseline: CAD risk factors + #current AAA ruopture this admit and  repair  09/11/2016 -> Off pressors but concern for RUE DVT  P:  MAP > 65 Get UE venous duple  RENAL A:   #baseline: ckd #current Maintains Creat 1.8  09/11/2016 -> nil acute but mag < 2  P:   moitor  GASTROINTESTINAL A:   Hx of OG tube insertin by GI with backround dysphagia as outpatint  P:   OG to continue - apparently removal needs to be careful  HEMATOLOGIC  Recent Labs Lab 09/09/16 0501 09/10/16 0539 09/11/16 0400  HGB 8.7* 9.1* 8.6*  HCT 27.1* 29.1* 27.4*  WBC 13.4* 14.4* 12.5*  PLT 186 212 224    A:   #RBC: anemia critical illness - strble #Platelet - ok #WBC  Stable leukycytosis  P:  - PRBC for hgb </= 6.9gm%    - exceptions are   -  if ACS susepcted/confirmed then transfuse for hgb </= 8.0gm%,  or    -  active bleeding with hemodynamic instability, then transfuse regardless of hemoglobin value   At at all times try to transfuse 1 unit prbc as possible with exception of active hemorrhage  Cotnnue Lovenox 40mg  SQ   INFECTIOUS Results for orders placed or performed during the hospital encounter of 09/11/2016  MRSA PCR Screening     Status: None   Collection Time: 09/14/2016  8:47 PM  Result Value Ref Range Status   MRSA by PCR NEGATIVE NEGATIVE Final    Comment:        The GeneXpert MRSA Assay (FDA approved for NASAL specimens only), is one component of a comprehensive MRSA colonization surveillance program. It is not intended to diagnose MRSA infection nor to guide or monitor treatment for MRSA infections.   Culture, respiratory (NON-Expectorated)     Status: None   Collection Time:  08/21/2016 12:24 PM  Result Value Ref Range Status   Specimen Description TRACHEAL ASPIRATE  Final   Special Requests Normal  Final   Gram Stain   Final    FEW WBC PRESENT,BOTH PMN AND MONONUCLEAR FEW SQUAMOUS EPITHELIAL CELLS PRESENT NO ORGANISMS SEEN    Culture Consistent with normal respiratory flora.  Final   Report Status 09/06/2016 FINAL  Final   Culture, blood (routine x 2)     Status: None   Collection Time: 09/04/16  4:11 PM  Result Value Ref Range Status   Specimen Description BLOOD LEFT HAND  Final   Special Requests IN PEDIATRIC BOTTLE 0.5CC  Final   Culture NO GROWTH 5 DAYS  Final   Report Status 09/09/2016 FINAL  Final  Culture, blood (routine x 2)     Status: None   Collection Time: 09/04/16  4:26 PM  Result Value Ref Range Status   Specimen Description BLOOD RIGHT ANTECUBITAL  Final   Special Requests BOTTLES DRAWN AEROBIC ONLY 5CC  Final   Culture NO GROWTH 5 DAYS  Final   Report Status 09/09/2016 FINAL  Final    Recent Labs Lab 09/10/16 0539 09/11/16 0400  PROCALCITON 0.91 0.54     A:   Possible aspiration v HCAP P:   Will dc vanc Anti-infectives    Start     Dose/Rate Route Frequency Ordered Stop   09/04/16 1600  vancomycin (VANCOCIN) IVPB 750 mg/150 ml premix  Status:  Discontinued     750 mg 150 mL/hr over 60 Minutes Intravenous Every 12 hours 09/04/16 1451 09/09/16 0850   09/04/16 1500  piperacillin-tazobactam (ZOSYN) IVPB 3.375 g     3.375 g 12.5 mL/hr over 240 Minutes Intravenous Every 8 hours 09/04/16 1451     08/26/2016 1900  cefUROXime (ZINACEF) 1.5 g in dextrose 5 % 50 mL IVPB     1.5 g 100 mL/hr over 30 Minutes Intravenous  Once 08/24/2016 1849     09/04/2016 2015  cefUROXime (ZINACEF) 1.5 g in dextrose 5 % 50 mL IVPB     1.5 g 100 mL/hr over 30 Minutes Intravenous Every 12 hours 08/29/2016 2006 09/04/2016 1013       ENDOCRINE A:   At risk hyperglycemia P:   ICU hyperglycemia protocol  NEUROLOGIC A:   #Baseline : chronic hycodan but otherwise negative hx  #Current: Agitrated on wUA   09/11/2016 -> RASS -3 on sedation gtt P:   RASS goal: 0 to -2 contue sedation gtt fent Add dioprivan to come off fent   FAMILY  - Updates: 09/11/2016 --> wife updated at bedside  - Inter-disciplinary family meet or Palliative Care meeting due by:  DAy 7 which is 09/11/2016  Current LOS is LOS 9  days   DISPO Keep in icu   The patient is critically ill with multiple organ systems failure and requires high complexity decision making for assessment and support, frequent evaluation and titration of therapies, application of advanced monitoring technologies and extensive interpretation of multiple databases.   Critical Care Time devoted to patient care services described in this note is  30  Minutes. This time reflects time of care of this signee Dr Kalman Shan. This critical care time does not reflect procedure time, or teaching time or supervisory time of PA/NP/Med student/Med Resident etc but could involve care discussion time    Dr. Kalman Shan, M.D., [facility].C.P Pulmonary and Critical Care Medicine Staff Physician [facility] [place] [facility] Critical [facility] Pager: [phone]  [phone], If no answer or between  15:00h - 7:00h: call [phone]  09/11/2016 8:56 AM         ASSESSMENT / PLAN:  PULMONARY A: Acute resp failure   2/19 - high peep and fio2 prevents sbt/extubation P:   Full vent support  CARDIOVASCULAR A:  S/p endovascular rrepair of ruptured AAA 2/13 and abd closure 2/14  2/19  - off pressors. Had A Fib RVR but now resolved P:  Dc cardizem  RENAL A:   Volume overloaded  P:   Diureses aggresively  GASTROINTESTINAL A:   On TF P:   Continue tf Continue ppi  HEMATOLOGIC A:   Anemia of critical illness P:  - PRBC for hgb </= 6.9gm%    - exceptions are   -  if ACS susepcted/confirmed then transfuse for hgb </= 8.0gm%,  or    - active bleeding with hemodynamic instability, then transfuse regardless of hemoglobin value   At at all times try to transfuse 1 unit prbc as possible with exception of active hemorrhage    INFECTIOUS A:   Possible HCAP P:   Anti-infectives    Start     Dose/Rate Route Frequency Ordered Stop   09/04/16 1600  vancomycin (VANCOCIN) IVPB 750 mg/150 ml premix  Status:  Discontinued     750  mg 150 mL/hr over 60 Minutes Intravenous Every 12 hours 09/04/16 1451 09/09/16 0850   09/04/16 1500  piperacillin-tazobactam (ZOSYN) IVPB 3.375 g     3.375 g 12.5 mL/hr over 240 Minutes Intravenous Every 8 hours 09/04/16 1451     09/02/2016 1900  cefUROXime (ZINACEF) 1.5 g in dextrose 5 % 50 mL IVPB     1.5 g 100 mL/hr over 30 Minutes Intravenous  Once 08/21/2016 1849     09/21/2016 2015  cefUROXime (ZINACEF) 1.5 g in dextrose 5 % 50 mL IVPB     1.5 g 100 mL/hr over 30 Minutes Intravenous Every 12 hours September 21, 2016 2006 09/04/2016 1013       ENDOCRINE A:   hyperglycemia P:   icu hyperglycemia protocol  NEUROLOGIC A:   RASS -3 off fent gtt x 1 h - 2/19 New onset RUE weakness 2/18; CT h ead negative P:   RASS goal: 0 Dc sedation gtt Change to fent prn Dependinn on WUA - will consider MRI   FAMILY  - Updates: wife and grandson updated 09/11/2016   - Inter-disciplinary family meet or Palliative Care meeting due by day 7 which is 09/09/16     The patient is critically ill with multiple organ systems failure and requires high complexity decision making for assessment and support, frequent evaluation and titration of therapies, application of advanced monitoring technologies and extensive interpretation of multiple databases.   Critical Care Time devoted to patient care services described in this note is  30  Minutes. This time reflects time of care of this signee Dr Kalman Shan. This critical care time does not reflect procedure time, or teaching time or supervisory time of PA/NP/Med student/Med Resident etc but could involve care discussion time    Dr. Kalman Shan, M.D., [facility].C.P Pulmonary and Critical Care Medicine Staff Physician [facility] [place] [facility] and Critical [facility] Pager: [phone], If no answer or between  15:00h - 7:00h: call [phone]  09/11/2016 8:56 AM

## 2016-09-12 ENCOUNTER — Inpatient Hospital Stay (HOSPITAL_COMMUNITY): Payer: Medicare Other

## 2016-09-12 DIAGNOSIS — I481 Persistent atrial fibrillation: Secondary | ICD-10-CM

## 2016-09-12 LAB — BASIC METABOLIC PANEL
Anion gap: 8 (ref 5–15)
BUN: 54 mg/dL — ABNORMAL HIGH (ref 6–20)
CHLORIDE: 104 mmol/L (ref 101–111)
CO2: 33 mmol/L — AB (ref 22–32)
CREATININE: 1.89 mg/dL — AB (ref 0.61–1.24)
Calcium: 7.9 mg/dL — ABNORMAL LOW (ref 8.9–10.3)
GFR calc non Af Amer: 34 mL/min — ABNORMAL LOW (ref >60)
GFR, EST AFRICAN AMERICAN: 40 mL/min — AB (ref >60)
GLUCOSE: 189 mg/dL — AB (ref 65–99)
Potassium: 2.6 mmol/L — CL (ref 3.5–5.1)
Sodium: 145 mmol/L (ref 135–145)

## 2016-09-12 LAB — CBC WITH DIFFERENTIAL/PLATELET
BASOS ABS: 0 10*3/uL (ref 0.0–0.1)
Basophils Relative: 0 %
EOS ABS: 0.2 10*3/uL (ref 0.0–0.7)
Eosinophils Relative: 1 %
HCT: 28.6 % — ABNORMAL LOW (ref 39.0–52.0)
HEMOGLOBIN: 8.9 g/dL — AB (ref 13.0–17.0)
LYMPHS ABS: 1.6 10*3/uL (ref 0.7–4.0)
Lymphocytes Relative: 10 %
MCH: 30 pg (ref 26.0–34.0)
MCHC: 31.1 g/dL (ref 30.0–36.0)
MCV: 96.3 fL (ref 78.0–100.0)
MONO ABS: 1.4 10*3/uL — AB (ref 0.1–1.0)
Monocytes Relative: 9 %
NEUTROS PCT: 80 %
Neutro Abs: 12.9 10*3/uL — ABNORMAL HIGH (ref 1.7–7.7)
Platelets: 270 10*3/uL (ref 150–400)
RBC: 2.97 MIL/uL — ABNORMAL LOW (ref 4.22–5.81)
RDW: 18 % — AB (ref 11.5–15.5)
WBC: 16.1 10*3/uL — AB (ref 4.0–10.5)

## 2016-09-12 LAB — GLUCOSE, CAPILLARY
GLUCOSE-CAPILLARY: 158 mg/dL — AB (ref 65–99)
GLUCOSE-CAPILLARY: 164 mg/dL — AB (ref 65–99)
GLUCOSE-CAPILLARY: 171 mg/dL — AB (ref 65–99)
Glucose-Capillary: 136 mg/dL — ABNORMAL HIGH (ref 65–99)
Glucose-Capillary: 181 mg/dL — ABNORMAL HIGH (ref 65–99)
Glucose-Capillary: 144 mg/dL — ABNORMAL HIGH (ref 65–99)

## 2016-09-12 LAB — PROCALCITONIN: Procalcitonin: 0.71 ng/mL

## 2016-09-12 LAB — MAGNESIUM: Magnesium: 2.2 mg/dL (ref 1.7–2.4)

## 2016-09-12 LAB — PHOSPHORUS: PHOSPHORUS: 3.1 mg/dL (ref 2.5–4.6)

## 2016-09-12 MED ORDER — METOPROLOL TARTRATE 5 MG/5ML IV SOLN
5.0000 mg | Freq: Four times a day (QID) | INTRAVENOUS | Status: DC
  Administered 2016-09-12 – 2016-09-17 (×14): 5 mg via INTRAVENOUS
  Filled 2016-09-12 (×14): qty 5

## 2016-09-12 MED ORDER — FUROSEMIDE 10 MG/ML IJ SOLN
80.0000 mg | Freq: Two times a day (BID) | INTRAMUSCULAR | Status: DC
  Administered 2016-09-12 – 2016-09-13 (×2): 80 mg via INTRAVENOUS
  Filled 2016-09-12 (×3): qty 8

## 2016-09-12 MED ORDER — FENTANYL CITRATE (PF) 100 MCG/2ML IJ SOLN
25.0000 ug | INTRAMUSCULAR | Status: DC | PRN
  Administered 2016-09-14: 25 ug via INTRAVENOUS
  Filled 2016-09-12: qty 2

## 2016-09-12 MED ORDER — CHLORHEXIDINE GLUCONATE 0.12 % MT SOLN
15.0000 mL | Freq: Two times a day (BID) | OROMUCOSAL | Status: DC
  Administered 2016-09-12 – 2016-09-15 (×8): 15 mL via OROMUCOSAL
  Filled 2016-09-12 (×3): qty 15

## 2016-09-12 MED ORDER — POTASSIUM CHLORIDE 20 MEQ/15ML (10%) PO SOLN
40.0000 meq | ORAL | Status: AC
  Administered 2016-09-12: 40 meq
  Filled 2016-09-12 (×2): qty 30

## 2016-09-12 MED ORDER — ORAL CARE MOUTH RINSE
15.0000 mL | Freq: Two times a day (BID) | OROMUCOSAL | Status: DC
  Administered 2016-09-12 – 2016-09-14 (×6): 15 mL via OROMUCOSAL

## 2016-09-12 NOTE — Progress Notes (Signed)
eLink Physician-Brief Progress Note Patient Name: Michael AmenDavid T Heath DOB: 1945-12-20 MRN: [id number]   Date of Service  09/12/2016  HPI/Events of Note  hypokalemia  eICU Interventions  Potassium replaced     Intervention Category Intermediate Interventions: Electrolyte abnormality - evaluation and management  [name] 09/12/2016, 5:42 AM

## 2016-09-12 NOTE — Progress Notes (Signed)
CRITICAL VALUE ALERT  Critical value received:  K 2.6  Date of notification:  09/12/2016   Time of notification:  0530  Critical value read back:Yes.    Nurse who received alert:  Modena JanskyKevin Rilda Bulls   MD notified (1st page):  [facility]  Time of first page:  0540  MD notified (2nd page): NA  Time of second page: NA  Responding MD:  Deterding  Time MD responded:  [phone]

## 2016-09-12 NOTE — Progress Notes (Signed)
RN called d/t pt desat 82% on 6 lpm [place].  Pt placed on 15 LPM HFNC, pt tol well so far, sat improved to 92%.  No distress currently noted.

## 2016-09-12 NOTE — Procedures (Signed)
Extubation Procedure Note  Patient Details:   Name: Michael Heath DOB: [birth year] MRN: [id number]   Airway Documentation:     Evaluation  O2 sats: stable throughout Complications: No apparent complications Patient did tolerate procedure well. Bilateral Breath Sounds: Clear   Yes   Patient extubated to bipap per MD order.  Positive cuff leak noted.  No evidence of stridor.  Patient able to speak post extubation.  Sats currently 92%.  Vitals are stable.  No complications noted.  Patient tolerating bipap well at this time.  Will continue to monitor.   Durwin GlazeBrown, Tamar Miano N 09/12/2016, 9:46 AM

## 2016-09-12 NOTE — Progress Notes (Addendum)
  AAA Progress Note    09/12/2016 8:20 AM 9 Days Post-Op  Subjective:  Intubated but awake-following commands.  Getting ready to get extubated today.  Tm 100 now 99.2 HR 70's-150's  110's-150's systolic 90% .40FiO2   Gtts:   Cardiezem TF's  Vitals:   09/12/16 0700 09/12/16 0721  BP: 133/62   Pulse: 87   Resp: (!) 22   Temp:  99.2 F (37.3 C)    Physical Exam: Lungs:  intubated Abdomen:  Soft, no pain to palpation; non distended; +BM yesterday Incisions:  Midline incision is clean and healing nicely.  Left groin serous drainage appears to be slowing down Extremities:  Easily palpable bilateral DP pulses; pt able to squeeze right hand. Moves all other extremities  Neuro:  Following commands RUE 1-2/5; LUE 4/5 BLE 4/5  CBC    Component Value Date/Time   WBC 16.1 (H) 09/12/2016 0410   RBC 2.97 (L) 09/12/2016 0410   HGB 8.9 (L) 09/12/2016 0410   HCT 28.6 (L) 09/12/2016 0410   HCT 47.6 05/01/2016 1001   PLT 270 09/12/2016 0410   PLT 230 05/01/2016 1001   MCV 96.3 09/12/2016 0410   MCV 93 05/01/2016 1001   MCH 30.0 09/12/2016 0410   MCHC 31.1 09/12/2016 0410   RDW 18.0 (H) 09/12/2016 0410   RDW 13.0 05/01/2016 1001   LYMPHSABS 1.6 09/12/2016 0410   LYMPHSABS 2.6 05/01/2016 1001   MONOABS 1.4 (H) 09/12/2016 0410   EOSABS 0.2 09/12/2016 0410   EOSABS 0.6 (H) 05/01/2016 1001   BASOSABS 0.0 09/12/2016 0410   BASOSABS 0.1 05/01/2016 1001    BMET    Component Value Date/Time   NA 145 09/12/2016 0410   NA 134 08/25/2016 1335   K 2.6 (LL) 09/12/2016 0410   CL 104 09/12/2016 0410   CO2 33 (H) 09/12/2016 0410   GLUCOSE 189 (H) 09/12/2016 0410   BUN 54 (H) 09/12/2016 0410   BUN 26 08/25/2016 1335   CREATININE 1.89 (H) 09/12/2016 0410   CALCIUM 7.9 (L) 09/12/2016 0410   GFRNONAA 34 (L) 09/12/2016 0410   GFRAA 40 (L) 09/12/2016 0410    INR    Component Value Date/Time   INR 1.32 09/04/2016 1458     Intake/Output Summary (Last 24 hours) at 09/12/16  0820 Last data filed at 09/12/16 0600  Gross per 24 hour  Intake          1983.33 ml  Output             2700 ml  Net          -716.67 ml     Assessment/Plan:  71 y.o. male is s/p  endovascular repair of ruptured aneurysm, exploratory laparotomy 9 Days Post-Op  -pt doing well this morning and following commands & more alert -pulmonary planning on extubating pt this morning -squeezing right hand more so today -creatinine decreased today at 1.89-good UOP -leukocytosis with mild fever:  Blood cx and resp cx negative; serous drainage from left groin slowing down. -anticoagulation per neuro/cards Hypokalemia supplemented Tolerating TF's-BM yesterday  Appreciate CCM, neuro and cards assistance with this pt.    [name], PA-C [facility] [phone] 09/12/2016 8:20 AM  Addendum  I agree with the physician assistant's findings.  Hopefully extubation today.  Leonides Sake, MD, FACS [facility] [place] Office: [phone] Pager: [phone]  09/12/2016, 6:30 PM

## 2016-09-12 NOTE — Progress Notes (Signed)
PULMONARY / CRITICAL CARE MEDICINE   Name: Michael Heath MRN: [id number] DOB: 31-Aug-1945    ADMISSION DATE:  08/21/2016 CONSULTATION DATE:  08/28/2016  REFERRING MD:  Coral Else MD  CHIEF COMPLAINT:  Ruptured AAA aneurysm status post repair, acute hypoxic respiratory failure.  brief 71 year old with past medical history of hypertension, hyperlipidemia, CKD, stage III. Admitted today with right flank, back pain. A CTA which showed ruptured AAA. He was transferred emergently to [facility] for stent repair. In the OR he received 16 units PRBC, 10 units FFP, 3 units platelets, 1 unit cryo and 1 Cell Saver. He developed abdominal comparment syndrome in OR from massive RP bleed. He is brought back to the ICU with an open abdomen. PCCM consulted for vent, sedation management.  CT Angio chest, abdomen 08/23/2016- ruptured 7.8 cm abdominal aortic aneurysm with a large RP hematoma. Moderate emphysema. No focal pneumonia, consolidation, opacities. CXR 09/01/16- COPD, no active cardio pulmonary disease All images personally reviewed. Echo 2/15 > N EF, diastolic dysfxn  LINES/TUBES: ETT 2/13 Rt IJ cordis 2/13  SIGNIFICANT EVENTS: 2/13- Admit, OR for ruptured AAA. 2/14- Back to OR for abdomina closure.  2/18 -   Weaned off pressors 2/17 . B/p stable this am  .  Still not following commands, limited responsiveness  Versed stopped this am . Weaning fentanyl -increased wob  Wife at bedside , updated.  Trickle feeds started 2/17  2./19 - RUE weakness overnight; CT head negative. Off pressors. Volume overloaded. Wife and grandson at bedside. RASS -3 off sedation gtt x 1h   2/20 - down to 60% fio2, peep 10. RUE improved. Coming down in sedation gtt. Less volume overloaded. SVT + in RUE  09/10/2016 - MRI with water shet infarct. Uanble to tolerate prn sedation. Diuresing. Still 11L positive. Back in A Fib RVR and on cadizem. Cards/neuro recommendeding anticooagulation when more stable after surgery. ASA  only now. Bialteral 1-39% CEA stenosis on [place]   09/11/16 - volumes status down to 4L Positive    SUBJECTIVE/OVERNIGHT/INTERVAL HX 2./23 - thirstay. Did SBT yesterday . Meets extubation criteria 09/12/2016 . Desires to hve ETT tube removed.   VITAL SIGNS: BP 133/62   Pulse 87   Temp 99.2 F (37.3 C) (Oral)   Resp (!) 22   Ht 6\' 1"  (1.854 m)   Wt 101.7 kg (224 lb 3.3 oz)   SpO2 97%   BMI 29.58 kg/m   HEMODYNAMICS:    VENTILATOR SETTINGS: Vent Mode: PRVC FiO2 (%):  [40 %-50 %] 40 % Set Rate:  [22 bmp] 22 bmp Vt Set:  [640 mL] 640 mL PEEP:  [5 cmH20-8 cmH20] 5 cmH20 Pressure Support:  [10 cmH20] 10 cmH20 Plateau Pressure:  [18 cmH20-20 cmH20] 18 cmH20  INTAKE / OUTPUT: I/O last 3 completed shifts: In: 3101.7 [I.V.:846.7; NG/GT:1840; IV Piggyback:415] Out: 5550 [Urine:5550]  PHYSICAL EXAMINATION:   General Appearance:    Looks criticall ill OBESE - no  Head:    Normocephalic, without obvious abnormality, atraumatic  Eyes:    PERRL - no, conjunctiva/corneas - muddy      Ears:    Normal external ear canals, both ears  Nose:   NG tube - no  Throat:  ETT TUBE - yes , OG tube - yes  Neck:   Supple,  No enlargement/tenderness/nodules     Lungs:     Clear to auscultation bilaterally, Ventilator   Synchrony - yes on PSV  Chest wall:    No deformity  Heart:  S1 and S2 normal, no murmur, CVP - na.  Pressors - none  Abdomen:     Soft, no masses, no organomegaly  Genitalia:    Not done  Rectal:   not done  Extremities:   Extremities- drier. RUE improved     Skin:   Intact in exposed areas . Sacral area - none     Neurologic:   Sedation - none -> RASS - +1 . Moves all 4s - yues with rUE weaker. CAM-ICU - negative . Orientation - appear oriented      LABS:   PULMONARY  Recent Labs Lab 09/06/16 0430  PHART 7.357  PCO2ART 48.0  PO2ART 66.3*  HCO3 26.0  O2SAT 90.9    CBC  Recent Labs Lab 09/10/16 0539 09/11/16 0400 09/12/16 0410  HGB 9.1* 8.6* 8.9*   HCT 29.1* 27.4* 28.6*  WBC 14.4* 12.5* 16.1*  PLT 212 224 270    COAGULATION No results for input(s): INR in the last 168 hours.  CARDIAC    Recent Labs Lab 09/07/16 2128 09/08/16 0318 09/09/16 0501  TROPONINI <0.03 <0.03 <0.03   No results for input(s): PROBNP in the last 168 hours.   CHEMISTRY  Recent Labs Lab 09/08/16 0318 09/09/16 0501 09/09/16 1720 09/09/16 2338 09/10/16 0537 09/10/16 0539 09/11/16 0400 09/11/16 1500 09/12/16 0410  NA 144 142 145 145 146*  --   --  149* 145  K 3.6 2.8* 3.4* 3.1* 3.4*  --   --  2.6* 2.6*  CL 106 103 102 103 103  --   --  105 104  CO2 29 31 33* 34* 34*  --   --  36* 33*  GLUCOSE 142* 130* 140* 139* 138*  --   --  156* 189*  BUN 37* 41* 46* 47* 47*  --   --  53* 54*  CREATININE 1.84* 1.88* 1.98* 2.01* 2.00*  --   --  2.03* 1.89*  CALCIUM 7.8* 7.4* 8.0* 8.0* 7.9*  --   --  8.0* 7.9*  MG 2.1 1.8  --  2.2  --  2.2 2.3  --  2.2  PHOS 2.7 2.9  --   --   --  2.9 2.8  --  3.1   Estimated Creatinine Clearance: 44.9 mL/min (by C-G formula based on SCr of 1.89 mg/dL (H)).   LIVER  Recent Labs Lab 09/08/16 0318 09/09/16 0501  AST 85* 82*  ALT 32 35  ALKPHOS 42 50  BILITOT 5.9* 4.7*  PROT 5.3* 5.4*  ALBUMIN 1.5* 1.4*     INFECTIOUS  Recent Labs Lab 09/08/16 0320 09/10/16 0539 09/11/16 0400 09/12/16 0410  LATICACIDVEN 1.7  --   --   --   PROCALCITON  --  0.91 0.54 0.71     ENDOCRINE CBG (last 3)   Recent Labs  09/11/16 2323 09/12/16 0429 09/12/16 0718  GLUCAP 127* 136* 158*         IMAGING x48h  - image(s) personally visualized  -   highlighted in bold Dg Chest Port 1 View  Result Date: 09/12/2016 CLINICAL DATA:  Endotracheal tube.  Respiratory failure EXAM: PORTABLE CHEST 1 VIEW COMPARISON:  Yesterday FINDINGS: Endotracheal tube tip between the clavicular heads and carina. An orogastric tube reaches the diaphragm at least. Left upper extremity PICC with tip at the upper cavoatrial junction.  Diffuse increased opacity at the bases with hazy appearance. No pneumothorax. Stable heart size. IMPRESSION: 1. Stable positioning of tubes and central line. 2. Generalized increased opacity  favoring progressive pulmonary edema. There is underlying pneumonia or atelectasis based on previous exams. Electronically Signed   By: Marnee Spring M.D.   On: 09/12/2016 07:15   Dg Chest Port 1 View  Result Date: 09/11/2016 CLINICAL DATA:  71 year old male, endotracheal tube advancement. Ruptured abdominal aortic aneurysm. Respiratory failure. Cerebral infarcts. Initial encounter. EXAM: PORTABLE CHEST 1 VIEW COMPARISON:  07/10/2017 and earlier. FINDINGS: Portable AP semi upright view at 1619 hours. Endotracheal tube tip in good position between the level the clavicles and carina. Enteric tube courses to the abdomen, tip not included. Stable left PICC line. Increased patchy and streaky opacity at the right lung base, while bilateral veiling opacity is less apparent. No pneumothorax or pulmonary edema. Stable cardiac size and mediastinal contours. Calcified aortic atherosclerosis. IMPRESSION: 1. Endotracheal tube tip in good position. Otherwise stable lines and tubes. 2. Mildly increased patchy right lung base opacity. Consider acute pneumonia. 3. Small bilateral pleural effusions and bibasilar atelectasis suspected. Electronically Signed   By: Odessa Fleming M.D.   On: 09/11/2016 16:31     DISCUSSION: Patient admitted  for acute AAA rupture and had endovascular repair of ruptured AAA and ex-lap for abdominal compartment syndrome. Abdomen was kept open after procedure then kept intubated post op. Patient with hypoxemia and resp failure  after receiveing significant amounts of blood products. Pt went back to OR on 2/14 for abdominal closure. Remains on the vent.    Patient Active Problem List   Diagnosis Date Noted  . Cerebral thrombosis with cerebral infarction 09/11/2016  . Acute hypoxemic respiratory failure (HCC)    . AKI (acute kidney injury) (HCC)   . Dysphagia   . Elevated troponin   . Coronary artery calcification seen on CAT scan   . Ruptured abdominal aortic aneurysm (AAA) (HCC) 08/24/2016  . TIA (transient ischemic attack) 09/01/2016  . CKD (chronic kidney disease), stage III 01/28/2016  . HLD (hyperlipidemia) 12/27/2015  . Vitamin D deficiency 12/27/2015  . Elevated blood pressure reading without diagnosis of hypertension 12/25/2015  . Obesity 12/25/2015  . Healthcare maintenance 12/25/2015      ASSESSMENT / PLAN:  PULMONARY A: #baseline: heavy smoker, likely has copd #current: Acute resp failure, following aaa rutpture  09/12/2016 -> meets extubation criteria  P:   Extubate to bipap  CARDIOVASCULAR A:  #baseline: cad risk factors + #current AAA ruprutre at this admit with repair and closure 2/20 - RUE SVT + and new recurrent A Fibon cardizemg tt  09/12/2016 -> On going PAF . Still on cardizem P:  Map goal > 65 cardizem per cards IV heparin gtt; cards and neuro deciding on timing in consult with VVS  RENAL  Intake/Output Summary (Last 24 hours) at 09/12/16 0824 Last data filed at 09/12/16 0600  Gross per 24 hour  Intake          1983.33 ml  Output             2700 ml  Net          -716.67 ml    Recent Labs Lab 09/09/16 1720 09/09/16 2338 09/10/16 0537 09/11/16 1500 09/12/16 0410  CREATININE 1.98* 2.01* 2.00* 2.03* 1.89*     A:   #baseline: ckd #current Creat 2.0mg  and rising with lasix. Mild low k repleted  09/12/2016 -improved volume overload; nearly resolved. Severe Low K replaced P:   Monitor with lasixl reduce lasix  GASTROINTESTINAL A:   On TF Had difficulty with placement of OG tube  P:  Dc  TF Remove OG sitting position per RN PPI   HEMATOLOGIC  Recent Labs Lab 09/10/16 0539 09/11/16 0400 09/12/16 0410  HGB 9.1* 8.6* 8.9*  HCT 29.1* 27.4* 28.6*  WBC 14.4* 12.5* 16.1*  PLT 212 224 270    A:   #RBC: anemia of critical  illness - stable anemia #Platelet ok #WBC stable leukocytosis  P:  - PRBC for hgb </= 6.9gm%    - exceptions are   -  if ACS susepcted/confirmed then transfuse for hgb </= 8.0gm%,  or    -  active bleeding with hemodynamic instability, then transfuse regardless of hemoglobin value   At at all times try to transfuse 1 unit prbc as possible with exception of active hemorrhage  - Continue lovenox 40mg  SQ  INFECTIOUS  Recent Labs Lab 09/10/16 0539 09/11/16 0400 09/12/16 0410  PROCALCITON 0.91 0.54 0.71    Results for orders placed or performed during the hospital encounter of 09/01/2016  MRSA PCR Screening     Status: None   Collection Time: 08/23/2016  8:47 PM  Result Value Ref Range Status   MRSA by PCR NEGATIVE NEGATIVE Final    Comment:        The GeneXpert MRSA Assay (FDA approved for NASAL specimens only), is one component of a comprehensive MRSA colonization surveillance program. It is not intended to diagnose MRSA infection nor to guide or monitor treatment for MRSA infections.   Culture, respiratory (NON-Expectorated)     Status: None   Collection Time: 09/12/2016 12:24 PM  Result Value Ref Range Status   Specimen Description TRACHEAL ASPIRATE  Final   Special Requests Normal  Final   Gram Stain   Final    FEW WBC PRESENT,BOTH PMN AND MONONUCLEAR FEW SQUAMOUS EPITHELIAL CELLS PRESENT NO ORGANISMS SEEN    Culture Consistent with normal respiratory flora.  Final   Report Status 09/06/2016 FINAL  Final  Culture, blood (routine x 2)     Status: None   Collection Time: 09/04/16  4:11 PM  Result Value Ref Range Status   Specimen Description BLOOD LEFT HAND  Final   Special Requests IN PEDIATRIC BOTTLE 0.5CC  Final   Culture NO GROWTH 5 DAYS  Final   Report Status 09/09/2016 FINAL  Final  Culture, blood (routine x 2)     Status: None   Collection Time: 09/04/16  4:26 PM  Result Value Ref Range Status   Specimen Description BLOOD RIGHT ANTECUBITAL  Final   Special  Requests BOTTLES DRAWN AEROBIC ONLY 5CC  Final   Culture NO GROWTH 5 DAYS  Final   Report Status 09/09/2016 FINAL  Final    A:   Possible Aspiration v HCAP  2/21- low grade fever + /223/28 - fever improved P:   chjeck PCT Anti-infectives    Start     Dose/Rate Route Frequency Ordered Stop   09/11/16 1200  ceFEPIme (MAXIPIME) 2 g in dextrose 5 % 50 mL IVPB     2 g 100 mL/hr over 30 Minutes Intravenous Every 24 hours 09/11/16 1014     09/04/16 1600  vancomycin (VANCOCIN) IVPB 750 mg/150 ml premix  Status:  Discontinued     750 mg 150 mL/hr over 60 Minutes Intravenous Every 12 hours 09/04/16 1451 09/09/16 0850   09/04/16 1500  piperacillin-tazobactam (ZOSYN) IVPB 3.375 g  Status:  Discontinued     3.375 g 12.5 mL/hr over 240 Minutes Intravenous Every 8 hours 09/04/16 1451 09/11/16 0908   09/16/2016 1900  cefUROXime (ZINACEF) 1.5 g in dextrose 5 % 50 mL IVPB  Status:  Discontinued     1.5 g 100 mL/hr over 30 Minutes Intravenous  Once 09-09-16 1849 09/11/16 0908   09/11/2016 2015  cefUROXime (ZINACEF) 1.5 g in dextrose 5 % 50 mL IVPB     1.5 g 100 mL/hr over 30 Minutes Intravenous Every 12 hours 08/23/2016 2006 09/09/16 1013       ENDOCRINE A:   hyoperglicyema P:   ICU hyperglycemia protocol  NEUROLOGIC A:   #Baseline : negative CNS/Psych hx  #Current  - acute agitated encephalopathy post intubation - resolved 09/10/16  - New RUE weakness with watershed infacrt in Rt carotid terroty 09/09/16   09/12/2016 -> resolved acute encephlaopathy   P:   RASS goal: 0  fent prn Start precedex gtt if needed Asa 81mg  per vVS, neuro and cards Anticiog for a fib - timing per cards/neuro Versed orn   FAMILY  - Updates: 09/12/2016 --> wife will be  updated 09/12/2016   - Inter-disciplinary family meet or Palliative Care meeting due by:  DAy 7. Current LOS is LOS 10 days - sdone 2/201/8 whic was day 7 - full medical care and full code   DISPO Keep in ICU   The patient is critically  ill with multiple organ systems failure and requires high complexity decision making for assessment and support, frequent evaluation and titration of therapies, application of advanced monitoring technologies and extensive interpretation of multiple databases.   Critical Care Time devoted to patient care services described in this note is  30  Minutes. This time reflects time of care of this signee Dr Kalman Shan. This critical care time does not reflect procedure time, or teaching time or supervisory time of PA/NP/Med student/Med Resident etc but could involve care discussion time    Dr. Kalman Shan, M.D., [facility].C.P Pulmonary and Critical Care Medicine Staff Physician [facility] [place] [facility] Pager: [phone], If no answer or between  15:00h - 7:00h: call [phone]  09/12/2016 8:24 AM

## 2016-09-12 NOTE — Progress Notes (Addendum)
Progress Note  Patient Name: Michael Heath Date of Encounter: 09/12/2016  Primary Cardiologist: Mendel Ryder   Subjective   71 yo with hx of CAD , HTN and ruptured AAA Now , s/p repair Developed atrial fib     Inpatient Medications    Scheduled Meds: . aspirin  325 mg Per Tube Daily  . ceFEPime (MAXIPIME) IV  2 g Intravenous Q24H  . chlorhexidine  15 mL Mouth Rinse BID  . Chlorhexidine Gluconate Cloth  6 each Topical Daily  . docusate  100 mg Oral Daily  . enoxaparin (LOVENOX) injection  40 mg Subcutaneous Q24H  . furosemide  80 mg Intravenous Q12H  . mouth rinse  15 mL Mouth Rinse q12n4p  . metoprolol tartrate  25 mg Per NG tube TID  . pantoprazole (PROTONIX) IV  40 mg Intravenous Q12H  . potassium chloride  40 mEq Per Tube Q4H  . sodium chloride flush  10-40 mL Intracatheter Q12H   Continuous Infusions: . sodium chloride 10 mL/hr at 09/12/16 0700  . dilTIAZem HCl-Dextrose 15 mg/hr (09/12/16 0600)   PRN Meds: sodium chloride, albuterol, artificial tears, bisacodyl, fentaNYL (SUBLIMAZE) injection, hydrALAZINE, labetalol, midazolam, ondansetron, phenol, sodium chloride flush   Vital Signs    Vitals:   09/12/16 0820 09/12/16 0900 09/12/16 1000 09/12/16 1118  BP: (!) 153/78 (!) 148/69 133/81   Pulse: (!) 105 91 83   Resp: (!) 28 (!) 32 (!) 24   Temp:    (!) 100.7 F (38.2 C)  TempSrc:    Axillary  SpO2: 91% 92% 90%   Weight:      Height:        Intake/Output Summary (Last 24 hours) at 09/12/16 1159 Last data filed at 09/12/16 1000  Gross per 24 hour  Intake          1913.33 ml  Output             3100 ml  Net         -1186.67 ml   Filed Weights   09/10/16 0500 09/11/16 0500 09/12/16 0500  Weight: 239 lb 10.2 oz (108.7 kg) 231 lb 0.7 oz (104.8 kg) 224 lb 3.3 oz (101.7 kg)    Telemetry    Atrial fib  - Personally Reviewed  ECG       Physical Exam   GEN:  extubated, sitting up in the chair Neck: No JVD Cardiac: irreg. Irreg. , no murmurs, rubs, or  gallops.  Respiratory: few rhonchi  GI: Soft, nontender, non-distended  MS: mild leg  edema; No deformity. Neuro:   awake and alert  Psych:  Seems to be normal   Labs    Chemistry Recent Labs Lab 09/08/16 0318 09/09/16 0501  09/10/16 0537 09/11/16 1500 09/12/16 0410  NA 144 142  < > 146* 149* 145  K 3.6 2.8*  < > 3.4* 2.6* 2.6*  CL 106 103  < > 103 105 104  CO2 29 31  < > 34* 36* 33*  GLUCOSE 142* 130*  < > 138* 156* 189*  BUN 37* 41*  < > 47* 53* 54*  CREATININE 1.84* 1.88*  < > 2.00* 2.03* 1.89*  CALCIUM 7.8* 7.4*  < > 7.9* 8.0* 7.9*  PROT 5.3* 5.4*  --   --   --   --   ALBUMIN 1.5* 1.4*  --   --   --   --   AST 85* 82*  --   --   --   --  ALT 32 35  --   --   --   --   ALKPHOS 42 50  --   --   --   --   BILITOT 5.9* 4.7*  --   --   --   --   GFRNONAA 35* 34*  < > 32* 31* 34*  GFRAA 41* 40*  < > 37* 36* 40*  ANIONGAP 9 8  < > 9 8 8   < > = values in this interval not displayed.   Hematology  Recent Labs Lab 09/10/16 0539 09/11/16 0400 09/12/16 0410  WBC 14.4* 12.5* 16.1*  RBC 3.04* 2.81* 2.97*  HGB 9.1* 8.6* 8.9*  HCT 29.1* 27.4* 28.6*  MCV 95.7 97.5 96.3  MCH 29.9 30.6 30.0  MCHC 31.3 31.4 31.1  RDW 17.4* 18.3* 18.0*  PLT 212 224 270    Cardiac Enzymes  Recent Labs Lab 09/07/16 2128 09/08/16 0318 09/09/16 0501  TROPONINI <0.03 <0.03 <0.03   No results for input(s): TROPIPOC in the last 168 hours.   BNPNo results for input(s): BNP, PROBNP in the last 168 hours.   DDimer No results for input(s): DDIMER in the last 168 hours.   Radiology    Dg Chest Port 1 View  Result Date: 09/12/2016 CLINICAL DATA:  Endotracheal tube.  Respiratory failure EXAM: PORTABLE CHEST 1 VIEW COMPARISON:  Yesterday FINDINGS: Endotracheal tube tip between the clavicular heads and carina. An orogastric tube reaches the diaphragm at least. Left upper extremity PICC with tip at the upper cavoatrial junction. Diffuse increased opacity at the bases with hazy appearance. No  pneumothorax. Stable heart size. IMPRESSION: 1. Stable positioning of tubes and central line. 2. Generalized increased opacity favoring progressive pulmonary edema. There is underlying pneumonia or atelectasis based on previous exams. Electronically Signed   By: Marnee SpringJonathon  Watts M.D.   On: 09/12/2016 07:15   Dg Chest Port 1 View  Result Date: 09/11/2016 CLINICAL DATA:  71 year old male, endotracheal tube advancement. Ruptured abdominal aortic aneurysm. Respiratory failure. Cerebral infarcts. Initial encounter. EXAM: PORTABLE CHEST 1 VIEW COMPARISON:  07/10/2017 and earlier. FINDINGS: Portable AP semi upright view at 1619 hours. Endotracheal tube tip in good position between the level the clavicles and carina. Enteric tube courses to the abdomen, tip not included. Stable left PICC line. Increased patchy and streaky opacity at the right lung base, while bilateral veiling opacity is less apparent. No pneumothorax or pulmonary edema. Stable cardiac size and mediastinal contours. Calcified aortic atherosclerosis. IMPRESSION: 1. Endotracheal tube tip in good position. Otherwise stable lines and tubes. 2. Mildly increased patchy right lung base opacity. Consider acute pneumonia. 3. Small bilateral pleural effusions and bibasilar atelectasis suspected. Electronically Signed   By: Odessa FlemingH  Hall M.D.   On: 09/11/2016 16:31    Cardiac Studies   Tele :   Paroxysmal atrial fib   Patient Profile     71 y.o. male with ruptured AAA .     Assessment & Plan    1. PAF :  Has had some atrial fib at night. Continue dilt drip and metoprolol  The nurse informed me that he is not getting the PO ( NG) metoprolol Will change to IV for today Swallow eval today  Would change back to PO once he is swallowing better  Rate is fairly well controlled.   Tachycardic at times   Following      Signed, Kristeen MissPhilip Nahser, MD  09/12/2016, 11:59 AM

## 2016-09-13 ENCOUNTER — Inpatient Hospital Stay (HOSPITAL_COMMUNITY): Payer: Medicare Other

## 2016-09-13 DIAGNOSIS — I480 Paroxysmal atrial fibrillation: Secondary | ICD-10-CM

## 2016-09-13 LAB — CBC WITH DIFFERENTIAL/PLATELET
BASOS PCT: 0 %
Basophils Absolute: 0 10*3/uL (ref 0.0–0.1)
EOS PCT: 2 %
Eosinophils Absolute: 0.4 10*3/uL (ref 0.0–0.7)
HEMATOCRIT: 32 % — AB (ref 39.0–52.0)
HEMOGLOBIN: 9.8 g/dL — AB (ref 13.0–17.0)
Lymphocytes Relative: 9 %
Lymphs Abs: 1.7 10*3/uL (ref 0.7–4.0)
MCH: 30.2 pg (ref 26.0–34.0)
MCHC: 30.6 g/dL (ref 30.0–36.0)
MCV: 98.5 fL (ref 78.0–100.0)
MONO ABS: 1.4 10*3/uL — AB (ref 0.1–1.0)
MONOS PCT: 7 %
NEUTROS PCT: 82 %
Neutro Abs: 15.8 10*3/uL — ABNORMAL HIGH (ref 1.7–7.7)
PLATELETS: 322 10*3/uL (ref 150–400)
RBC: 3.25 MIL/uL — AB (ref 4.22–5.81)
RDW: 18.3 % — ABNORMAL HIGH (ref 11.5–15.5)
WBC: 19.3 10*3/uL — ABNORMAL HIGH (ref 4.0–10.5)

## 2016-09-13 LAB — MAGNESIUM: MAGNESIUM: 2.4 mg/dL (ref 1.7–2.4)

## 2016-09-13 LAB — BASIC METABOLIC PANEL
Anion gap: 8 (ref 5–15)
BUN: 51 mg/dL — ABNORMAL HIGH (ref 6–20)
CHLORIDE: 107 mmol/L (ref 101–111)
CO2: 36 mmol/L — AB (ref 22–32)
Calcium: 8.4 mg/dL — ABNORMAL LOW (ref 8.9–10.3)
Creatinine, Ser: 1.66 mg/dL — ABNORMAL HIGH (ref 0.61–1.24)
GFR calc Af Amer: 46 mL/min — ABNORMAL LOW (ref >60)
GFR calc non Af Amer: 40 mL/min — ABNORMAL LOW (ref >60)
GLUCOSE: 139 mg/dL — AB (ref 65–99)
POTASSIUM: 2.8 mmol/L — AB (ref 3.5–5.1)
Sodium: 151 mmol/L — ABNORMAL HIGH (ref 135–145)

## 2016-09-13 LAB — GLUCOSE, CAPILLARY
GLUCOSE-CAPILLARY: 127 mg/dL — AB (ref 65–99)
GLUCOSE-CAPILLARY: 131 mg/dL — AB (ref 65–99)
Glucose-Capillary: 131 mg/dL — ABNORMAL HIGH (ref 65–99)
Glucose-Capillary: 118 mg/dL — ABNORMAL HIGH (ref 65–99)

## 2016-09-13 LAB — PHOSPHORUS: Phosphorus: 3.8 mg/dL (ref 2.5–4.6)

## 2016-09-13 MED ORDER — KCL IN DEXTROSE-NACL 40-5-0.45 MEQ/L-%-% IV SOLN
INTRAVENOUS | Status: DC
  Administered 2016-09-13 – 2016-09-14 (×2): via INTRAVENOUS
  Administered 2016-09-15: 75 mL via INTRAVENOUS
  Administered 2016-09-16: 01:00:00 via INTRAVENOUS
  Administered 2016-09-18: 10 mL via INTRAVENOUS
  Administered 2016-09-18: 09:00:00 via INTRAVENOUS
  Filled 2016-09-13 (×7): qty 1000

## 2016-09-13 MED ORDER — POTASSIUM CHLORIDE 2 MEQ/ML IV SOLN
30.0000 meq | Freq: Once | INTRAVENOUS | Status: AC
  Administered 2016-09-13: 30 meq via INTRAVENOUS
  Filled 2016-09-13: qty 15

## 2016-09-13 MED ORDER — SODIUM CHLORIDE 0.9 % IV SOLN
Freq: Once | INTRAVENOUS | Status: AC
  Administered 2016-09-13: 07:00:00 via INTRAVENOUS
  Filled 2016-09-13: qty 1000

## 2016-09-13 MED ORDER — FUROSEMIDE 10 MG/ML IJ SOLN: 80.0000 mg | Freq: Once | INTRAMUSCULAR | Status: DC

## 2016-09-13 MED ORDER — SODIUM CHLORIDE 0.9 % IV SOLN
30.0000 meq | Freq: Once | INTRAVENOUS | Status: AC
  Administered 2016-09-13: 30 meq via INTRAVENOUS
  Filled 2016-09-13: qty 15

## 2016-09-13 NOTE — Evaluation (Signed)
Clinical/Bedside Swallow Evaluation Patient Details  Name: Michael Heath MRN: [id number] Date of Birth: [birth year]  Today's Date: 09/13/2016 Time: SLP Start Time (ACUTE ONLY): [id number] SLP Stop Time (ACUTE ONLY): 0935 SLP Time Calculation (min) (ACUTE ONLY): 12 min  Past Medical History: History reviewed. No pertinent past medical history. Past Surgical History:  Past Surgical History:  Procedure Laterality Date  . ABDOMINAL AORTIC ENDOVASCULAR STENT GRAFT N/A 09/05/2016   Procedure: ABDOMINAL AORTIC ENDOVASCULAR STENT GRAFT;  Surgeon: Nada LibmanVance W Brabham, MD;  [facility];  Service: Vascular;  Laterality: N/A;  . ABDOMINAL EXPOSURE  08/27/2016   Procedure: Abdominal Exploration;  Surgeon: Nada LibmanVance W Brabham, MD;  [facility];  Service: Vascular;;  . APPLICATION OF WOUND VAC  09/09/2016   Procedure: APPLICATION OF WOUND VAC;  Surgeon: Nada LibmanVance W Brabham, MD;  Location: [facility] OR;  Service: Vascular;;  . CATARACT EXTRACTION W/PHACO Right 03/2010  . CATARACT EXTRACTION W/PHACO Left 02/24/2013   Procedure: CATARACT EXTRACTION PHACO AND INTRAOCULAR LENS PLACEMENT (IOC);  Surgeon: Gemma PayorKerry Hunt, MD;  Location: [facility] ORS;  Service: Ophthalmology;  Laterality: Left;  CDE: 13.82  . ESOPHAGOGASTRODUODENOSCOPY N/A 08/24/2016   Procedure: ESOPHAGOGASTRODUODENOSCOPY (EGD);  Surgeon: Napoleon FormKavitha Nandigam V, MD;  [facility];  Service: Endoscopy;  Laterality: N/A;  . EYE SURGERY    . HEMATOMA EVACUATION  09/16/2016   Procedure: EVACUATION HEMATOMA;  Surgeon: Nada LibmanVance W Brabham, MD;  [facility];  Service: Vascular;;  . LAPAROTOMY N/A 09/13/2016   Procedure: EXPLORATORY LAPAROTOMY;  Surgeon: Nada LibmanVance W Brabham, MD;  [facility];  Service: Vascular;  Laterality: N/A;   HPI:  71 year old with past medical history of hypertension, hyperlipidemia, CKD, stage III. Admitted today with right flank, back pain. A CTA which showed ruptured AAA. [facility]. He developed abdominal  comparment syndrome in OR from massive RP bleed. Patient with hypoxemia and resp failure after receiveing significant amounts of blood products. Intubated 2/13- 2/23. No prior swallowing evaluations found in chart, but MD notes "a couple of years history history of dysphagia" which was never brought to the attention of his physician. MRI 2/20 showed Multifocal acute ischemia, including the left frontal watershed region, possibly secondary to an acute hypotensive episode. The other scattered punctate foci of ischemia are more suggestive of a central cardio embolic process.   Assessment / Plan / Recommendation Clinical Impression  Patient presents with aphonia, weak cough suggestive of reduced airway protection following 10 day intubation. Patient alert and following basic commands, elicits a volitional swallow. Performed oral care to remove dried secretions present on lingual and dental surfaces. Pt with suspected delayed swallow of ice chips, multiple swallows. Respiratory rate in the 20s with intermittent elevation to low 30s. Family report history of dysphagia with solid foods (meats) with frequent regurgitation. Given known risk of silent aspiration with prolonged intubation, patient's deconditioning, respiratory status and reported history of dysphagia, recommend instrumental assessment prior to advancing oral diet. Recommend pt remain NPO, may have ice chips PRN after oral care. MBS will be scheduled for today or tomorrow (2/25). SLP Visit Diagnosis: Dysphagia, oropharyngeal phase (R13.12)    Aspiration Risk  Severe aspiration risk    Diet Recommendation Ice chips PRN after oral care;NPO   Liquid Administration via: Spoon Medication Administration: Via alternative means    Other  Recommendations Oral Care Recommendations: Oral care QID;Oral care prior to ice chip/H20   Follow up Recommendations        Frequency and Duration  Prognosis Prognosis for Safe Diet Advancement: Good       Swallow Study   General Date of Onset: 09/11/2016 HPI: 71 year old with past medical history of hypertension, hyperlipidemia, CKD, stage III. Admitted today with right flank, back pain. A CTA which showed ruptured AAA. He was transferred emergently to [facility] for stent repair. He developed abdominal comparment syndrome in OR from massive RP bleed. Patient with hypoxemia and resp failure after receiveing significant amounts of blood products. Intubated 2/13- 2/23. No prior swallowing evaluations found in chart, but MD notes "a couple of years history history of dysphagia" which was never brought to the attention of his physician. MRI 2/20 showed Multifocal acute ischemia, including the left frontal watershed region, possibly secondary to an acute hypotensive episode. The other scattered punctate foci of ischemia are more suggestive of a central cardio embolic process. Type of Study: Bedside Swallow Evaluation Previous Swallow Assessment: none per chart Diet Prior to this Study: NPO Temperature Spikes Noted: No Respiratory Status: Nasal cannula History of Recent Intubation: Yes Length of Intubations (days): 10 days Date extubated: 09/12/16 Behavior/Cognition: Alert;Cooperative Oral Cavity Assessment: Dry;Dried secretions Oral Care Completed by SLP: Yes Oral Cavity - Dentition: Adequate natural dentition Vision: Functional for self-feeding Self-Feeding Abilities: Needs assist Patient Positioning: Upright in bed Baseline Vocal Quality: Aphonic Volitional Cough: Weak;Congested Volitional Swallow: Unable to elicit    Oral/Motor/Sensory Function Overall Oral Motor/Sensory Function: Within functional limits   Ice Chips Ice chips: Impaired Presentation: Spoon Pharyngeal Phase Impairments: Suspected delayed Swallow;Multiple swallows;Cough - Delayed   Thin Liquid Thin Liquid: Not tested    Nectar Thick Nectar Thick Liquid: Not tested   Honey Thick Honey Thick Liquid: Not tested   Puree Puree:  Not tested   Solid   GO   Solid: Not tested        Arlana Lindau 09/13/2016,9:53 AM Rondel Baton, MS CF-SLP Speech-Language Pathologist [phone]

## 2016-09-13 NOTE — Progress Notes (Signed)
PULMONARY / CRITICAL CARE MEDICINE   Name: Michael Heath MRN: [id number] DOB: July 06, 1946    ADMISSION DATE:  09/11/2016 CONSULTATION DATE:  09/14/2016  REFERRING MD:  Coral Else MD  CHIEF COMPLAINT:  Ruptured AAA aneurysm status post repair, acute hypoxic respiratory failure.  brief 71 year old with past medical history of hypertension, hyperlipidemia, CKD, stage III. Admitted today with right flank, back pain. A CTA which showed ruptured AAA. He was transferred emergently to [facility] for stent repair. In the OR he received 16 units PRBC, 10 units FFP, 3 units platelets, 1 unit cryo and 1 Cell Saver. He developed abdominal comparment syndrome in OR from massive RP bleed. He is brought back to the ICU with an open abdomen. PCCM consulted for vent, sedation management.  CT Angio chest, abdomen 09/01/2016- ruptured 7.8 cm abdominal aortic aneurysm with a large RP hematoma. Moderate emphysema. No focal pneumonia, consolidation, opacities. CXR 09/01/16- COPD, no active cardio pulmonary disease All images personally reviewed. Echo 2/15 > N EF, diastolic dysfxn   SIGNIFICANT EVENTS: 2/13- Admit, OR for ruptured AAA. 2/14- Back to OR for abdomina closure.  2/18 -   Weaned off pressors 2/17 . B/p stable this am  .  Still not following commands, limited responsiveness  Versed stopped this am . Weaning fentanyl -increased wob  Wife at bedside , updated.  Trickle feeds started 2/17  2./19 - RUE weakness overnight; CT head negative. Off pressors. Volume overloaded. Wife and grandson at bedside. RASS -3 off sedation gtt x 1h   2/20 - down to 60% fio2, peep 10. RUE improved. Coming down in sedation gtt. Less volume overloaded. SVT + in RUE  09/10/2016 - MRI with water shet infarct. Uanble to tolerate prn sedation. Diuresing. Still 11L positive. Back in A Fib RVR and on cadizem. Cards/neuro recommendeding anticooagulation when more stable after surgery. ASA only now. Bialteral 1-39% CEA stenosis on  [place]   09/11/16 - volumes status down to 4L Positive  2./23 - thirstay. Extubated to bipap. Lasix reduced   SUBJECTIVE/OVERNIGHT/INTERVAL HX . 2/24 - only 200cc positive (from 16L positive on 2/17). Remains extubaed. VOice hoarse.On 10L [place] without disrees  VITAL SIGNS: BP (!) 133/56 (BP Location: Right Leg)   Pulse 72   Temp 99.1 F (37.3 C) (Axillary)   Resp (!) 30   Ht 6\' 1"  (1.854 m)   Wt 97.9 kg (215 lb 13.3 oz)   SpO2 (!) 88%   BMI 28.48 kg/m   HEMODYNAMICS:    VENTILATOR SETTINGS: Vent Mode: BIPAP FiO2 (%):  [60 %-70 %] 70 % Set Rate:  [12 bmp] 12 bmp PEEP:  [8 cmH20] 8 cmH20  INTAKE / OUTPUT: I/O last 3 completed shifts: In: 1735 [I.V.:1005; NG/GT:680; IV Piggyback:50] Out: 5015 [Urine:5015]  PHYSICAL EXAMINATION:  General Appearance:    Looks better  Head:    Normocephalic, without obvious abnormality, atraumatic  Eyes:    PERRL - yes, conjunctiva/corneas - clear      Ears:    Normal external ear canals, both ears  Nose:   NG tube - no  Throat:  ETT TUBE - no , OG tube - no. VOICE HOARSE  Neck:   Supple,  No enlargement/tenderness/nodules     Lungs:     Clear to auscultation bilaterally,  Chest wall:    No deformity  Heart:    S1 and S2 normal, no murmur, CVP - na.  Pressors - none but on cardizem gtt. HR 71 and sinus  Abdomen:  Soft, no masses, no organomegaly  Genitalia:    Not done  Rectal:   not done  Extremities:   Extremities- improved edema.      Skin:   Intact in exposed areas . Sacral area - do not know     Neurologic:   Sedation - none -> RASS - +1 . Moves all 4s - yes but RUE weak. CAM-ICU - negative for delirium . Orientation - fully oriented         LABS:   PULMONARY No results for input(s): PHART, PCO2ART, PO2ART, HCO3, TCO2, O2SAT in the last 168 hours.  Invalid input(s): PCO2, PO2  CBC  Recent Labs Lab 09/11/16 0400 09/12/16 0410 09/13/16 0306  HGB 8.6* 8.9* 9.8*  HCT 27.4* 28.6* 32.0*  WBC 12.5* 16.1* 19.3*   PLT 224 270 322    COAGULATION No results for input(s): INR in the last 168 hours.  CARDIAC    Recent Labs Lab 09/07/16 2128 09/08/16 0318 09/09/16 0501  TROPONINI <0.03 <0.03 <0.03   No results for input(s): PROBNP in the last 168 hours.   CHEMISTRY  Recent Labs Lab 09/09/16 0501  09/09/16 2338 09/10/16 0537 09/10/16 0539 09/11/16 0400 09/11/16 1500 09/12/16 0410 09/13/16 0306 09/13/16 0529  NA 142  < > 145 146*  --   --  149* 145  --  151*  K 2.8*  < > 3.1* 3.4*  --   --  2.6* 2.6*  --  2.8*  CL 103  < > 103 103  --   --  105 104  --  107  CO2 31  < > 34* 34*  --   --  36* 33*  --  36*  GLUCOSE 130*  < > 139* 138*  --   --  156* 189*  --  139*  BUN 41*  < > 47* 47*  --   --  53* 54*  --  51*  CREATININE 1.88*  < > 2.01* 2.00*  --   --  2.03* 1.89*  --  1.66*  CALCIUM 7.4*  < > 8.0* 7.9*  --   --  8.0* 7.9*  --  8.4*  MG 1.8  --  2.2  --  2.2 2.3  --  2.2 2.4  --   PHOS 2.9  --   --   --  2.9 2.8  --  3.1 3.8  --   < > = values in this interval not displayed. Estimated Creatinine Clearance: 50.3 mL/min (by C-G formula based on SCr of 1.66 mg/dL (H)).   LIVER  Recent Labs Lab 09/08/16 0318 09/09/16 0501  AST 85* 82*  ALT 32 35  ALKPHOS 42 50  BILITOT 5.9* 4.7*  PROT 5.3* 5.4*  ALBUMIN 1.5* 1.4*     INFECTIOUS  Recent Labs Lab 09/08/16 0320 09/10/16 0539 09/11/16 0400 09/12/16 0410  LATICACIDVEN 1.7  --   --   --   PROCALCITON  --  0.91 0.54 0.71     ENDOCRINE CBG (last 3)   Recent Labs  09/12/16 2315 09/13/16 0308 09/13/16 0711  GLUCAP 144* 131* 127*         IMAGING x48h  - image(s) personally visualized  -   highlighted in bold Dg Chest Port 1 View  Result Date: 09/13/2016 CLINICAL DATA:  Abnormal respiration. EXAM: PORTABLE CHEST 1 VIEW COMPARISON:  09/12/2016 and CT chest 09/11/2016. FINDINGS: Trachea is midline. Interval extubation. Left PICC tip projects over the SVC RA junction or  high right atrium. Heart is  enlarged, stable. Minimal left basilar volume loss and a tiny bilateral pleural effusions. IMPRESSION: Minimal bibasilar volume loss and tiny bilateral pleural effusions. Electronically Signed   By: Leanna Battles M.D.   On: 09/13/2016 08:03   Dg Chest Port 1 View  Result Date: 09/12/2016 CLINICAL DATA:  Endotracheal tube.  Respiratory failure EXAM: PORTABLE CHEST 1 VIEW COMPARISON:  Yesterday FINDINGS: Endotracheal tube tip between the clavicular heads and carina. An orogastric tube reaches the diaphragm at least. Left upper extremity PICC with tip at the upper cavoatrial junction. Diffuse increased opacity at the bases with hazy appearance. No pneumothorax. Stable heart size. IMPRESSION: 1. Stable positioning of tubes and central line. 2. Generalized increased opacity favoring progressive pulmonary edema. There is underlying pneumonia or atelectasis based on previous exams. Electronically Signed   By: Marnee Spring M.D.   On: 09/12/2016 07:15   Dg Chest Port 1 View  Result Date: 09/11/2016 CLINICAL DATA:  71 year old male, endotracheal tube advancement. Ruptured abdominal aortic aneurysm. Respiratory failure. Cerebral infarcts. Initial encounter. EXAM: PORTABLE CHEST 1 VIEW COMPARISON:  07/10/2017 and earlier. FINDINGS: Portable AP semi upright view at 1619 hours. Endotracheal tube tip in good position between the level the clavicles and carina. Enteric tube courses to the abdomen, tip not included. Stable left PICC line. Increased patchy and streaky opacity at the right lung base, while bilateral veiling opacity is less apparent. No pneumothorax or pulmonary edema. Stable cardiac size and mediastinal contours. Calcified aortic atherosclerosis. IMPRESSION: 1. Endotracheal tube tip in good position. Otherwise stable lines and tubes. 2. Mildly increased patchy right lung base opacity. Consider acute pneumonia. 3. Small bilateral pleural effusions and bibasilar atelectasis suspected. Electronically Signed    By: Odessa Fleming M.D.   On: 09/11/2016 16:31     DISCUSSION: Patient admitted  for acute AAA rupture and had endovascular repair of ruptured AAA and ex-lap for abdominal compartment syndrome. Abdomen was kept open after procedure then kept intubated post op. Patient with hypoxemia and resp failure  after receiveing significant amounts of blood products. Pt went back to OR on 2/14 for abdominal closure. Remains on the vent.    Patient Active Problem List   Diagnosis Date Noted  . Cerebral thrombosis with cerebral infarction 09/11/2016  . Acute hypoxemic respiratory failure (HCC)   . AKI (acute kidney injury) (HCC)   . Dysphagia   . Elevated troponin   . Coronary artery calcification seen on CAT scan   . Ruptured abdominal aortic aneurysm (AAA) (HCC) 08/22/2016  . TIA (transient ischemic attack) 09/01/2016  . CKD (chronic kidney disease), stage III 01/28/2016  . HLD (hyperlipidemia) 12/27/2015  . Vitamin D deficiency 12/27/2015  . Elevated blood pressure reading without diagnosis of hypertension 12/25/2015  . Obesity 12/25/2015  . Healthcare maintenance 12/25/2015      ASSESSMENT / PLAN:  PULMONARY A: #baseline: heavy smoker, likely has copd #current: Acute resp failure, following aaa rutpture  09/13/2016 -> remains etubated but requiring HFNC  P:   bipap qhs o2 for opusle ox > 88% Mobilize  CARDIOVASCULAR A:  #baseline: cad risk factors + #current AAA ruprutre at this admit with repair and closure 2/20 - RUE SVT + and new recurrent A Fibon cardizemg tt  09/13/2016 -> NSR 71. Still on cardizem P:  Map goal > 65 cardizem per cards IV heparin gtt; cards and neuro deciding on timing to start this in consult with VVS  RENAL  Intake/Output Summary (Last 24 hours) at  09/13/16 0859 Last data filed at 09/13/16 0800  Gross per 24 hour  Intake           696.25 ml  Output             3585 ml  Net         -2888.75 ml    Recent Labs Lab 09/09/16 2338 09/10/16 0537  09/11/16 1500 09/12/16 0410 09/13/16 0529  CREATININE 2.01* 2.00* 2.03* 1.89* 1.66*     A:   #baseline: ckd #current Creat 2.0mg  and rising with lasix. Mild low k repleted  09/13/2016 -improved volume overload; nearly resolved. Severe Low K and high NA P:   Monitor with lasixl reduce lasix to once daily Replete K  GASTROINTESTINAL A:   On TF Had difficulty with placement of OG tube - hx of dysphagia  2/24 - voice hoarse  P:   Dysphagia eval by slp NPO till then  HEMATOLOGIC  Recent Labs Lab 09/11/16 0400 09/12/16 0410 09/13/16 0306  HGB 8.6* 8.9* 9.8*  HCT 27.4* 28.6* 32.0*  WBC 12.5* 16.1* 19.3*  PLT 224 270 322    A:   #RBC: anemia of critical illness - stable anemia #Platelet ok #WBC stable leukocytosis  P:  - PRBC for hgb </= 6.9gm%    - exceptions are   -  if ACS susepcted/confirmed then transfuse for hgb </= 8.0gm%,  or    -  active bleeding with hemodynamic instability, then transfuse regardless of hemoglobin value   At at all times try to transfuse 1 unit prbc as possible with exception of active hemorrhage  - Continue lovenox 40mg  SQ  INFECTIOUS  Recent Labs Lab 09/10/16 0539 09/11/16 0400 09/12/16 0410  PROCALCITON 0.91 0.54 0.71    Results for orders placed or performed during the hospital encounter of 09/13/16  MRSA PCR Screening     Status: None   Collection Time: 09/13/2016  8:47 PM  Result Value Ref Range Status   MRSA by PCR NEGATIVE NEGATIVE Final    Comment:        The GeneXpert MRSA Assay (FDA approved for NASAL specimens only), is one component of a comprehensive MRSA colonization surveillance program. It is not intended to diagnose MRSA infection nor to guide or monitor treatment for MRSA infections.   Culture, respiratory (NON-Expectorated)     Status: None   Collection Time: 09/17/2016 12:24 PM  Result Value Ref Range Status   Specimen Description TRACHEAL ASPIRATE  Final   Special Requests Normal  Final   Gram  Stain   Final    FEW WBC PRESENT,BOTH PMN AND MONONUCLEAR FEW SQUAMOUS EPITHELIAL CELLS PRESENT NO ORGANISMS SEEN    Culture Consistent with normal respiratory flora.  Final   Report Status 09/06/2016 FINAL  Final  Culture, blood (routine x 2)     Status: None   Collection Time: 09/04/16  4:11 PM  Result Value Ref Range Status   Specimen Description BLOOD LEFT HAND  Final   Special Requests IN PEDIATRIC BOTTLE 0.5CC  Final   Culture NO GROWTH 5 DAYS  Final   Report Status 09/09/2016 FINAL  Final  Culture, blood (routine x 2)     Status: None   Collection Time: 09/04/16  4:26 PM  Result Value Ref Range Status   Specimen Description BLOOD RIGHT ANTECUBITAL  Final   Special Requests BOTTLES DRAWN AEROBIC ONLY 5CC  Final   Culture NO GROWTH 5 DAYS  Final   Report Status 09/09/2016 FINAL  Final    A:   Possible Aspiration v HCAP  2/21- low grade fever +  2./24 - afebrile  P:   Dc abx Anti-infectives    Start     Dose/Rate Route Frequency Ordered Stop   09/11/16 1200  ceFEPIme (MAXIPIME) 2 g in dextrose 5 % 50 mL IVPB  Status:  Discontinued     2 g 100 mL/hr over 30 Minutes Intravenous Every 24 hours 09/11/16 1014 09/13/16 0911   09/04/16 1600  vancomycin (VANCOCIN) IVPB 750 mg/150 ml premix  Status:  Discontinued     750 mg 150 mL/hr over 60 Minutes Intravenous Every 12 hours 09/04/16 1451 09/09/16 0850   09/04/16 1500  piperacillin-tazobactam (ZOSYN) IVPB 3.375 g  Status:  Discontinued     3.375 g 12.5 mL/hr over 240 Minutes Intravenous Every 8 hours 09/04/16 1451 09/11/16 0908   09/15/2016 1900  cefUROXime (ZINACEF) 1.5 g in dextrose 5 % 50 mL IVPB  Status:  Discontinued     1.5 g 100 mL/hr over 30 Minutes Intravenous  Once 09/04/2016 1849 09/11/16 0908   08/29/2016 2015  cefUROXime (ZINACEF) 1.5 g in dextrose 5 % 50 mL IVPB     1.5 g 100 mL/hr over 30 Minutes Intravenous Every 12 hours 08/25/2016 2006 09/09/2016 1013       ENDOCRINE A:   hyoperglicyema P:   ICU  hyperglycemia protocol  NEUROLOGIC A:   #Baseline : negative CNS/Psych hx  #Current  - acute agitated encephalopathy post intubation - resolved 09/10/16  - New RUE weakness with watershed infacrt in Rt carotid terroty 09/09/16   09/13/2016 -> resolved acute encephlaopathy for few days now. RUE still weak   P:   RASS goal: 0  fent prn Asa 81mg  per vVS, neuro and cards Anticiog for a fib - timing per cards/neuro Versed orn   FAMILY  - Updates: 09/13/2016 --> patient updated 09/13/2016  and his son at bedsie   - Inter-disciplinary family meet or Palliative Care meeting due by:  DAy 7. Current LOS is LOS 11 days - sdone 2/201/8 whic was day 7 - full medical care and full code   DISPO Ok for SDU from ccm standpiont . Needs bippa qhs - still needing hfnc and has copd. pccm will follow     Dr. Kalman ShanMurali Laymon Stockert, M.D., [facility] Pulmonary and Critical Care Medicine Staff Physician [facility] [facility] Pager: [phone], If no answer or between  15:00h - 7:00h: call [phone]  09/13/2016 8:59 AM

## 2016-09-13 NOTE — Progress Notes (Signed)
Progress Note  Patient Name: Michael Heath Date of Encounter: 09/13/2016  Primary Cardiologist: Mendel Ryder   Subjective   71 yo with hx of CAD , HTN and ruptured AAA. Now , s/p repair  Developed post-op atrial fib. Now back in NSR. More awake.   Volume status much improved. Weight down below baseline weight.      Inpatient Medications    Scheduled Meds: . aspirin  325 mg Per Tube Daily  . ceFEPime (MAXIPIME) IV  2 g Intravenous Q24H  . chlorhexidine  15 mL Mouth Rinse BID  . Chlorhexidine Gluconate Cloth  6 each Topical Daily  . docusate  100 mg Oral Daily  . enoxaparin (LOVENOX) injection  40 mg Subcutaneous Q24H  . furosemide  80 mg Intravenous Q12H  . mouth rinse  15 mL Mouth Rinse q12n4p  . metoprolol  5 mg Intravenous Q6H  . pantoprazole (PROTONIX) IV  40 mg Intravenous Q12H  . sodium chloride flush  10-40 mL Intracatheter Q12H   Continuous Infusions: . sodium chloride 10 mL/hr at 09/13/16 0700  . dilTIAZem HCl-Dextrose Stopped (09/13/16 0800)   PRN Meds: sodium chloride, albuterol, artificial tears, bisacodyl, fentaNYL (SUBLIMAZE) injection, hydrALAZINE, labetalol, midazolam, ondansetron, phenol, sodium chloride flush   Vital Signs    Vitals:   09/13/16 0700 09/13/16 0715 09/13/16 0800 09/13/16 0810  BP: 111/82  (!) 133/56   Pulse: (!) 55  72   Resp: (!) 21  (!) 30   Temp:  99.1 F (37.3 C)    TempSrc:  Axillary    SpO2: 91%  93% (!) 88%  Weight:      Height:        Intake/Output Summary (Last 24 hours) at 09/13/16 0910 Last data filed at 09/13/16 0800  Gross per 24 hour  Intake           671.25 ml  Output             3585 ml  Net         -2913.75 ml   Filed Weights   09/11/16 0500 09/12/16 0500 09/13/16 0600  Weight: 104.8 kg (231 lb 0.7 oz) 101.7 kg (224 lb 3.3 oz) 97.9 kg (215 lb 13.3 oz)    Telemetry    NSR 60-70s   Physical Exam   GEN:  sitting up in bed More alert  Neck: No JVD supple  Cardiac: RRR , no murmurs, rubs, or gallops.    Respiratory: dull at bases tachypneic GI: surgical dressing ok. Hypoactive BS. NT MS: warm. Trace edema;  Neuro:   awake and alert  Psych: Normal   Labs    Chemistry Recent Labs Lab 09/08/16 0318 09/09/16 0501  09/11/16 1500 09/12/16 0410 09/13/16 0529  NA 144 142  < > 149* 145 151*  K 3.6 2.8*  < > 2.6* 2.6* 2.8*  CL 106 103  < > 105 104 107  CO2 29 31  < > 36* 33* 36*  GLUCOSE 142* 130*  < > 156* 189* 139*  BUN 37* 41*  < > 53* 54* 51*  CREATININE 1.84* 1.88*  < > 2.03* 1.89* 1.66*  CALCIUM 7.8* 7.4*  < > 8.0* 7.9* 8.4*  PROT 5.3* 5.4*  --   --   --   --   ALBUMIN 1.5* 1.4*  --   --   --   --   AST 85* 82*  --   --   --   --   ALT  32 35  --   --   --   --   ALKPHOS 42 50  --   --   --   --   BILITOT 5.9* 4.7*  --   --   --   --   GFRNONAA 35* 34*  < > 31* 34* 40*  GFRAA 41* 40*  < > 36* 40* 46*  ANIONGAP 9 8  < > 8 8 8   < > = values in this interval not displayed.   Hematology  Recent Labs Lab 09/11/16 0400 09/12/16 0410 09/13/16 0306  WBC 12.5* 16.1* 19.3*  RBC 2.81* 2.97* 3.25*  HGB 8.6* 8.9* 9.8*  HCT 27.4* 28.6* 32.0*  MCV 97.5 96.3 98.5  MCH 30.6 30.0 30.2  MCHC 31.4 31.1 30.6  RDW 18.3* 18.0* 18.3*  PLT 224 270 322    Cardiac Enzymes  Recent Labs Lab 09/07/16 2128 09/08/16 0318 09/09/16 0501  TROPONINI <0.03 <0.03 <0.03   No results for input(s): TROPIPOC in the last 168 hours.   BNPNo results for input(s): BNP, PROBNP in the last 168 hours.   DDimer No results for input(s): DDIMER in the last 168 hours.   Radiology    Dg Chest Port 1 View  Result Date: 09/13/2016 CLINICAL DATA:  Abnormal respiration. EXAM: PORTABLE CHEST 1 VIEW COMPARISON:  09/12/2016 and CT chest 08/27/2016. FINDINGS: Trachea is midline. Interval extubation. Left PICC tip projects over the SVC RA junction or high right atrium. Heart is enlarged, stable. Minimal left basilar volume loss and a tiny bilateral pleural effusions. IMPRESSION: Minimal bibasilar volume loss  and tiny bilateral pleural effusions. Electronically Signed   By: Leanna BattlesMelinda  Blietz M.D.   On: 09/13/2016 08:03   Dg Chest Port 1 View  Result Date: 09/12/2016 CLINICAL DATA:  Endotracheal tube.  Respiratory failure EXAM: PORTABLE CHEST 1 VIEW COMPARISON:  Yesterday FINDINGS: Endotracheal tube tip between the clavicular heads and carina. An orogastric tube reaches the diaphragm at least. Left upper extremity PICC with tip at the upper cavoatrial junction. Diffuse increased opacity at the bases with hazy appearance. No pneumothorax. Stable heart size. IMPRESSION: 1. Stable positioning of tubes and central line. 2. Generalized increased opacity favoring progressive pulmonary edema. There is underlying pneumonia or atelectasis based on previous exams. Electronically Signed   By: Marnee SpringJonathon  Watts M.D.   On: 09/12/2016 07:15   Dg Chest Port 1 View  Result Date: 09/11/2016 CLINICAL DATA:  71 year old male, endotracheal tube advancement. Ruptured abdominal aortic aneurysm. Respiratory failure. Cerebral infarcts. Initial encounter. EXAM: PORTABLE CHEST 1 VIEW COMPARISON:  07/10/2017 and earlier. FINDINGS: Portable AP semi upright view at 1619 hours. Endotracheal tube tip in good position between the level the clavicles and carina. Enteric tube courses to the abdomen, tip not included. Stable left PICC line. Increased patchy and streaky opacity at the right lung base, while bilateral veiling opacity is less apparent. No pneumothorax or pulmonary edema. Stable cardiac size and mediastinal contours. Calcified aortic atherosclerosis. IMPRESSION: 1. Endotracheal tube tip in good position. Otherwise stable lines and tubes. 2. Mildly increased patchy right lung base opacity. Consider acute pneumonia. 3. Small bilateral pleural effusions and bibasilar atelectasis suspected. Electronically Signed   By: Odessa FlemingH  Hall M.D.   On: 09/11/2016 16:31    Cardiac Studies   Tele :   Paroxysmal atrial fib   Patient Profile     71 y.o.  male with ruptured AAA .     Assessment & Plan    1. Post-op AF -  now back in NSR 2. Volume overload. - now resolved.He is actually dry.     --has swallow study today. Will need free water 3. Ruptured AAA    --s/p repair 4. Acute resp failure    --intubated 2/13-2/23   --remains tachypneic with sats 89-91%. Stressed need for incentive spirometry  5. Hypernatremia    --will give gentle IVF until taking pos.  6. AKI     --improving 7. Hypokalemia     --CCM supplmenting 8. CAD, stable     --no ischemia. Continue ASA. Start statin when taking pos.   D/w Dr. Illene Silver at bedside. We will see again Monday. Call with questions.    Migdalia Dk, MD  09/13/2016, 9:10 AM

## 2016-09-13 NOTE — Evaluation (Signed)
Physical Therapy Evaluation Patient Details Name: Michael Heath MRN: [id number] DOB: 1945-09-21 Today's Date: 09/13/2016   History of Present Illness  Patient is a 71 yo male admitted 08/26/2016 with Rt flank pain.  Patient with ruptured AAA, now s/p repair.  Post-op retroperitoneal bleed with abd compartment syndrome.  Post-op Afib and acute hypoxic resp failure, intubated 08/02/16.  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  Extubated 09/12/16.    PMH:  CAD, CKD, HTN, HLD  Clinical Impression  Patient presents with problems listed below due surgery, and complications occurring afterward.  Patient will benefit from acute PT to maximize functional mobility prior to discharge.  Patient with Rt-sided weakness due to watershed infarcts, impacting UE > LE, and general weakness from surgeries.  Today, patient requiring +2 max assist for mobility.  Recommend Inpatient Rehab consult to return patient to optimal functional level.    Follow Up Recommendations CIR;Supervision/Assistance - 24 hour    Equipment Recommendations  Wheelchair (measurements PT);Wheelchair cushion (measurements PT)    Recommendations for Other Services OT consult;Rehab consult     Precautions / Restrictions Precautions Precautions: Fall Precaution Comments: Rt-side weakness Restrictions Weight Bearing Restrictions: No      Mobility  Bed Mobility Overal bed mobility: Needs Assistance Bed Mobility: Supine to Sit;Sit to Supine     Supine to sit: Max assist;+2 for physical assistance;HOB elevated Sit to supine: Max assist;+2 for physical assistance;HOB elevated   General bed mobility comments: Verbal cues to move LE's toward EOB.  Patient required assist to move RLE.  Required +2 max assist to raise trunk to sitting position.  Once upright, patient requiring mod assist to maintain balance, with posterior lean.  Patient sat EOB x 10 minutes.  O2 sats dropping into 80's, with lowest at 84%.  Encouraged deep  breathing through nose to get O2 (patient on high flow O2 at 12L).  Unable to raise O2 sats above 88%.  Patient required +2 max assist to return to supine.  Once supine, O2 sats increased to/above 90%.  Transfers                 General transfer comment: NT  Ambulation/Gait                Stairs            Wheelchair Mobility    Modified Rankin (Stroke Patients Only) Modified Rankin (Stroke Patients Only) Pre-Morbid Rankin Score: No symptoms (Unsure) Modified Rankin: Severe disability (post-op and CVA)     Balance Overall balance assessment: Needs assistance Sitting-balance support: Single extremity supported;Feet supported Sitting balance-Leahy Scale: Poor Sitting balance - Comments: Required UE support and external assist to maintain sitting balance. Postural control: Posterior lean                                   Pertinent Vitals/Pain Pain Assessment: No/denies pain    Home Living Family/patient expects to be discharged to:: Inpatient rehab (OR SNF) Living Arrangements: Spouse/significant other                    Prior Function           Comments: Unsure.  Patient unable to give accurate information.     Hand Dominance   Dominant Hand: Left    Extremity/Trunk Assessment   Upper Extremity Assessment Upper Extremity Assessment: RUE deficits/detail RUE Deficits / Details: Decreased strength and coordination.  Strength grossly 2/5 at shoulder, and 3-/5 at elbow RUE Coordination: decreased gross motor;decreased fine motor    Lower Extremity Assessment Lower Extremity Assessment: Generalized weakness;Difficult to assess due to impaired cognition (Strength grossly 3/5 on LLE and 3-/5 on RLE.)    Cervical / Trunk Assessment Cervical / Trunk Assessment: Kyphotic  Communication   Communication: Expressive difficulties  Cognition Arousal/Alertness: Awake/alert Behavior During Therapy: WFL for tasks  assessed/performed Overall Cognitive Status: No family/caregiver present to determine baseline cognitive functioning                 General Comments: Oriented x3.  Difficulty following motor commands.  When asked to breathe in through nose, patient blows out.  Difficulty following directions regarding rolling, and leaning forward in sitting.    General Comments      Exercises     Assessment/Plan    PT Assessment Patient needs continued PT services  PT Problem List Decreased strength;Decreased activity tolerance;Decreased balance;Decreased mobility;Decreased coordination;Decreased cognition;Decreased knowledge of use of DME;Decreased safety awareness;Cardiopulmonary status limiting activity;Obesity;Decreased skin integrity       PT Treatment Interventions DME instruction;Gait training;Functional mobility training;Therapeutic activities;Balance training;Neuromuscular re-education;Cognitive remediation;Patient/family education    PT Goals (Current goals can be found in the Care Plan section)  Acute Rehab PT Goals Patient Stated Goal: Did not state PT Goal Formulation: With patient Time For Goal Achievement: 09/27/16 Potential to Achieve Goals: Good    Frequency Min 3X/week   Barriers to discharge Decreased caregiver support Patient requires assist of 2 at this point.    Co-evaluation               End of Session Equipment Utilized During Treatment: Oxygen Activity Tolerance: Patient limited by fatigue;Treatment limited secondary to medical complications (Comment) (O2 sats decreased with exertion of sitting) Patient left: in bed;with call bell/phone within reach;with SCD's reapplied Nurse Communication: Mobility status;Need for lift equipment PT Visit Diagnosis: Muscle weakness (generalized) (M62.81);Hemiplegia and hemiparesis;Difficulty in walking, not elsewhere classified (R26.2) Hemiplegia - Right/Left: Right Hemiplegia - dominant/non-dominant:  Non-dominant Hemiplegia - caused by: Cerebral infarction         Time: [id number] PT Time Calculation (min) (ACUTE ONLY): 20 min   Charges:   PT Evaluation $PT Eval High Complexity: 1 Procedure     PT G Codes:         Vena AustriaSusan H Natilee Gauer 09/13/2016, 9:49 PM Durenda HurtSusan H. Renaldo Fiddleravis, PT, [facility] CenterMBA Acute Rehab Services Pager [phone]

## 2016-09-13 NOTE — Progress Notes (Signed)
RT note: patient currently resting comfortably on high-flow nasal cannula.  No distress noted at time.  Bipap not indicated currently.  Will continue to monitor.

## 2016-09-13 NOTE — Progress Notes (Signed)
Modified Barium Swallow Progress Note  Patient Details  Name: Michael Heath MRN: [id number] Date of Birth: Mar 08, 1946  Today's Date: 09/13/2016  Modified Barium Swallow completed.  Full report located under Chart Review in the Imaging Section.  Brief recommendations include the following:  Clinical Impression  Patient presents with moderate oral and severe pharyngeal dysphagia with frank aspiration of thin liquids, penetration of honey-thick liquids and pureed solids with suspected aspiration of material mixed with secretions. Oral stage of swallowing noted for lingual pumping, weak lingual manipulation, delayed oral transit and retrograde movement of bolus from base of tongue to the oral cavity (puree). Swallow is delayed to the pyriform sinuses with thin, and after spillover the epiglottis with honey and pureed consistencies. Noted reduced BOT retraction. Radiologist confirmed presence of cervical osteophytes and protrusion of posterior pharyngeal wall in initial image. SLP noted during examination pt with unilateral posterior pharyngeal protrusion which impeded epiglottic deflection and airway closure, leading to severe residue in the valleculae and posterior pharyngeal wall with all consistencies (thin, honey-thick liquids and pureed), unilateral bolus flow, penetration/aspiration during and after the swallow. Recommend ENT consult to rule out osteophytes vs tissue anomaly in the pharynx. Pt is at severe risk for aspiration; recommend NPO with ice chips PRN after oral care. Patient will likely need temporary alternative means of nutrition/hydration. Patient may also benefit from cognitive-linguistic evaluation. SLP will follow up to determine readiness for PO intake, follow-up instrumental as appropriate.    Swallow Evaluation Recommendations   Recommended Consults: Consider ENT evaluation   SLP Diet Recommendations: NPO       Medication Administration: Via alternative means               Oral Care Recommendations: Oral care QID     Rondel BatonMary Beth Kraven Calk, MS CF-SLP Speech-Language Pathologist [phone]   Arlana LindauMary E Malaysha Heath 09/13/2016,1:40 PM

## 2016-09-13 NOTE — Progress Notes (Signed)
Daily Progress Note   Assessment/Planning: POD #10 s/p EVAR for rAAA, decompressive xlap for abd compartment syndrome   NEURO: CVA, dense R hemiparesis on exam, moving L arm spontanenously -> pain rx prn  PULM: successful extubation, still tachypneic with shallow breathes -> IS, pulm toilet, Bipap prn, keep in SICU   CV: afib on Dilt drip, appreciate Cardio input  GI: hypoBS, no PO until swallow evaluated  FEN: continue MIVF until PO started, consider TF vs TPN if swallow failed  REN: nearing neutral fluid, -3 L yesterday, no further   HEME/ID: concern for aspiration vs HCAP, abx: maxipime  Subjective  - 10 Days Post-Op  Blinks eyes to questions and moves L hand to questions  Objective Vitals:   09/13/16 0600 09/13/16 0630 09/13/16 0700 09/13/16 0715  BP:   111/82   Pulse: 63 65 (!) 55   Resp: (!) 31 (!) 31 (!) 21   Temp:    99.1 F (37.3 C)  TempSrc:    Axillary  SpO2: 90% 94% 91%   Weight: 215 lb 13.3 oz (97.9 kg)     Height:         Intake/Output Summary (Last 24 hours) at 09/13/16 0745 Last data filed at 09/13/16 0700  Gross per 24 hour  Intake              695 ml  Output             3585 ml  Net            -2890 ml   NEURO moves L hand spontaneously and meaningully PULM  Rales throughout, shallow breathes, tachypneic CV  RRR (in and out of afib on flowsheet), dilt @ 15 mg/hr GI  soft, inc c/d/i, appropriate TTP, -G/R, +mild distension, hypo BS VASC  Palpable DP R>L  Laboratory CBC    Component Value Date/Time   WBC 19.3 (H) 09/13/2016 0306   HGB 9.8 (L) 09/13/2016 0306   HCT 32.0 (L) 09/13/2016 0306   HCT 47.6 05/01/2016 1001   PLT 322 09/13/2016 0306   PLT 230 05/01/2016 1001    BMET    Component Value Date/Time   NA 151 (H) 09/13/2016 0529   NA 134 08/25/2016 1335   K 2.8 (L) 09/13/2016 0529   CL 107 09/13/2016 0529   CO2 36 (H) 09/13/2016 0529   GLUCOSE 139 (H) 09/13/2016 0529   BUN 51 (H) 09/13/2016 0529   BUN 26 08/25/2016 1335     CREATININE 1.66 (H) 09/13/2016 0529   CALCIUM 8.4 (L) 09/13/2016 0529   GFRNONAA 40 (L) 09/13/2016 0529   GFRAA 46 (L) 09/13/2016 0529   Current Facility-Administered Medications  Medication Dose Route Frequency Provider Last Rate Last Dose  . 0.9 %  sodium chloride infusion  500 mL Intravenous Once PRN Lars Mage, PA-C      . 0.9 %  sodium chloride infusion   Intravenous Continuous Tammy S Parrett, NP 10 mL/hr at 09/13/16 0700    . albuterol (PROVENTIL) (2.5 MG/3ML) 0.083% nebulizer solution 2.5 mg  2.5 mg Nebulization Q4H PRN Jose Angelo A Christene Slates, MD      . artificial tears (LACRILUBE) ophthalmic ointment   Both Eyes Q4H PRN Nada Libman, MD      . aspirin tablet 325 mg  325 mg Per Tube Daily Layne Benton, NP   325 mg at 09/11/16 1049  . bisacodyl (DULCOLAX) suppository 10 mg  10 mg Rectal Daily PRN  Lars MageEmma M Collins, PA-C   10 mg at 09/06/16 2359  . ceFEPIme (MAXIPIME) 2 g in dextrose 5 % 50 mL IVPB  2 g Intravenous Q24H Nada LibmanVance W Brabham, MD   2 g at 09/12/16 1235  . chlorhexidine (PERIDEX) 0.12 % solution 15 mL  15 mL Mouth Rinse BID Kalman ShanMurali Ramaswamy, MD   15 mL at 09/12/16 2114  . Chlorhexidine Gluconate Cloth 2 % PADS 6 each  6 each Topical Daily Nada LibmanVance W Brabham, MD   6 each at 09/11/16 [phone]  . diltiazem (CARDIZEM) 100 mg in dextrose 5% 100mL (1 mg/mL) infusion  5-15 mg/hr Intravenous Titrated Nada LibmanVance W Brabham, MD 10 mL/hr at 09/13/16 0700 10 mg/hr at 09/13/16 0700  . docusate (COLACE) 50 MG/5ML liquid 100 mg  100 mg Oral Daily Nada LibmanVance W Brabham, MD   100 mg at 09/10/16 0849  . enoxaparin (LOVENOX) injection 40 mg  40 mg Subcutaneous Q24H Nada LibmanVance W Brabham, MD   40 mg at 09/12/16 1222  . fentaNYL (SUBLIMAZE) injection 25 mcg  25 mcg Intravenous Q1H PRN Kalman ShanMurali Ramaswamy, MD      . furosemide (LASIX) injection 80 mg  80 mg Intravenous Q12H Kalman ShanMurali Ramaswamy, MD   80 mg at 09/13/16 0525  . hydrALAZINE (APRESOLINE) injection 5 mg  5 mg Intravenous Q20 Min PRN Lars MageEmma M Collins, PA-C      .  labetalol (NORMODYNE,TRANDATE) injection 10 mg  10 mg Intravenous Q10 min PRN Lars MageEmma M Collins, PA-C   10 mg at 09/14/2016 2033  . MEDLINE mouth rinse  15 mL Mouth Rinse q12n4p Kalman ShanMurali Ramaswamy, MD   15 mL at 09/12/16 1600  . metoprolol (LOPRESSOR) injection 5 mg  5 mg Intravenous Q6H Vesta MixerPhilip J Nahser, MD   5 mg at 09/13/16 0525  . midazolam (VERSED) injection 1-4 mg  1-4 mg Intravenous Q2H PRN Kalman ShanMurali Ramaswamy, MD   2 mg at 09/13/16 0400  . ondansetron (ZOFRAN) injection 4 mg  4 mg Intravenous Q6H PRN Lars MageEmma M Collins, PA-C      . pantoprazole (PROTONIX) injection 40 mg  40 mg Intravenous Q12H Nada LibmanVance W Brabham, MD   40 mg at 09/12/16 2113  . phenol (CHLORASEPTIC) mouth spray 1 spray  1 spray Mouth/Throat PRN Lars MageEmma M Collins, PA-C      . sodium chloride flush (NS) 0.9 % injection 10-40 mL  10-40 mL Intracatheter Q12H Nada LibmanVance W Brabham, MD   10 mL at 09/13/16 0000  . sodium chloride flush (NS) 0.9 % injection 10-40 mL  10-40 mL Intracatheter PRN Nada LibmanVance W Brabham, MD         Leonides SakeBrian Deysha Cartier, MD, FACS [facility] Office: [phone] Pager: [phone]  09/13/2016, 7:45 AM

## 2016-09-13 NOTE — Progress Notes (Signed)
eLink Physician-Brief Progress Note Patient Name: Michael AmenDavid T Heath DOB: November 12, 1945 MRN: [id number]   Date of Service  09/13/2016  HPI/Events of Note  Hypokalemia  eICU Interventions  Potassium replaced     Intervention Category Intermediate Interventions: Electrolyte abnormality - evaluation and management  Lance Huaracha 09/13/2016, 6:46 AM

## 2016-09-14 ENCOUNTER — Inpatient Hospital Stay (HOSPITAL_COMMUNITY): Payer: Medicare Other

## 2016-09-14 LAB — GLUCOSE, CAPILLARY
GLUCOSE-CAPILLARY: 147 mg/dL — AB (ref 65–99)
GLUCOSE-CAPILLARY: 135 mg/dL — AB (ref 65–99)
GLUCOSE-CAPILLARY: 126 mg/dL — AB (ref 65–99)
Glucose-Capillary: 116 mg/dL — ABNORMAL HIGH (ref 65–99)
Glucose-Capillary: 121 mg/dL — ABNORMAL HIGH (ref 65–99)
Glucose-Capillary: 128 mg/dL — ABNORMAL HIGH (ref 65–99)

## 2016-09-14 LAB — BASIC METABOLIC PANEL
ANION GAP: 5 (ref 5–15)
ANION GAP: 8 (ref 5–15)
BUN: 39 mg/dL — ABNORMAL HIGH (ref 6–20)
BUN: 47 mg/dL — ABNORMAL HIGH (ref 6–20)
CHLORIDE: 116 mmol/L — AB (ref 101–111)
CO2: 29 mmol/L (ref 22–32)
CO2: 31 mmol/L (ref 22–32)
Calcium: 8.1 mg/dL — ABNORMAL LOW (ref 8.9–10.3)
Calcium: 8.1 mg/dL — ABNORMAL LOW (ref 8.9–10.3)
Chloride: 111 mmol/L (ref 101–111)
Creatinine, Ser: 1.41 mg/dL — ABNORMAL HIGH (ref 0.61–1.24)
Creatinine, Ser: 1.55 mg/dL — ABNORMAL HIGH (ref 0.61–1.24)
GFR calc non Af Amer: 49 mL/min — ABNORMAL LOW (ref >60)
GFR, EST AFRICAN AMERICAN: 56 mL/min — AB (ref >60)
GFR, EST AFRICAN AMERICAN: 50 mL/min — AB (ref >60)
GFR, EST NON AFRICAN AMERICAN: 43 mL/min — AB (ref >60)
GLUCOSE: 148 mg/dL — AB (ref 65–99)
GLUCOSE: 136 mg/dL — AB (ref 65–99)
POTASSIUM: 3.5 mmol/L (ref 3.5–5.1)
POTASSIUM: 3 mmol/L — AB (ref 3.5–5.1)
Sodium: 150 mmol/L — ABNORMAL HIGH (ref 135–145)
Sodium: 150 mmol/L — ABNORMAL HIGH (ref 135–145)

## 2016-09-14 LAB — CBC WITH DIFFERENTIAL/PLATELET
BASOS ABS: 0 10*3/uL (ref 0.0–0.1)
Basophils Relative: 0 %
Eosinophils Absolute: 0.3 10*3/uL (ref 0.0–0.7)
Eosinophils Relative: 2 %
HCT: 30 % — ABNORMAL LOW (ref 39.0–52.0)
Hemoglobin: 9.2 g/dL — ABNORMAL LOW (ref 13.0–17.0)
Lymphocytes Relative: 10 %
Lymphs Abs: 1.7 10*3/uL (ref 0.7–4.0)
MCH: 30.5 pg (ref 26.0–34.0)
MCHC: 30.7 g/dL (ref 30.0–36.0)
MCV: 99.3 fL (ref 78.0–100.0)
MONO ABS: 1 10*3/uL (ref 0.1–1.0)
Monocytes Relative: 6 %
NEUTROS PCT: 82 %
Neutro Abs: 13.7 10*3/uL — ABNORMAL HIGH (ref 1.7–7.7)
PLATELETS: 349 10*3/uL (ref 150–400)
RBC: 3.02 MIL/uL — AB (ref 4.22–5.81)
RDW: 18.8 % — AB (ref 11.5–15.5)
WBC: 16.7 10*3/uL — AB (ref 4.0–10.5)

## 2016-09-14 LAB — BLOOD GAS, ARTERIAL
Acid-Base Excess: 5.2 mmol/L — ABNORMAL HIGH (ref 0.0–2.0)
Bicarbonate: 29.3 mmol/L — ABNORMAL HIGH (ref 20.0–28.0)
Delivery systems: POSITIVE
Drawn by: 39899
EXPIRATORY PAP: 5
FIO2: 60
Inspiratory PAP: 10
O2 SAT: 96.7 %
PATIENT TEMPERATURE: 100.6
PH ART: 7.424 (ref 7.350–7.450)
PO2 ART: 95.1 mmHg (ref 83.0–108.0)
pCO2 arterial: 46.1 mmHg (ref 32.0–48.0)

## 2016-09-14 LAB — HEPARIN LEVEL (UNFRACTIONATED)

## 2016-09-14 LAB — PROCALCITONIN: PROCALCITONIN: 0.33 ng/mL

## 2016-09-14 LAB — PHOSPHORUS: Phosphorus: 2.6 mg/dL (ref 2.5–4.6)

## 2016-09-14 LAB — LACTIC ACID, PLASMA: Lactic Acid, Venous: 1.1 mmol/L (ref 0.5–1.9)

## 2016-09-14 LAB — MAGNESIUM: MAGNESIUM: 2.2 mg/dL (ref 1.7–2.4)

## 2016-09-14 MED ORDER — AMIODARONE IV BOLUS ONLY 150 MG/100ML
150.0000 mg | Freq: Once | INTRAVENOUS | Status: AC
  Administered 2016-09-14: 150 mg via INTRAVENOUS
  Filled 2016-09-14: qty 100

## 2016-09-14 MED ORDER — SODIUM CHLORIDE 0.9 % IV SOLN
30.0000 meq | Freq: Once | INTRAVENOUS | Status: AC
  Administered 2016-09-14: 30 meq via INTRAVENOUS
  Filled 2016-09-14: qty 15

## 2016-09-14 MED ORDER — DEXTROSE 5 % IV SOLN
1.0000 g | Freq: Three times a day (TID) | INTRAVENOUS | Status: DC
  Administered 2016-09-14 – 2016-09-19 (×15): 1 g via INTRAVENOUS
  Filled 2016-09-14 (×16): qty 1

## 2016-09-14 MED ORDER — HEPARIN (PORCINE) IN NACL 100-0.45 UNIT/ML-% IJ SOLN
1700.0000 [IU]/h | INTRAMUSCULAR | Status: DC
  Administered 2016-09-14: 1300 [IU]/h via INTRAVENOUS
  Administered 2016-09-15 – 2016-09-17 (×4): 1700 [IU]/h via INTRAVENOUS
  Filled 2016-09-14 (×6): qty 250

## 2016-09-14 MED ORDER — AMIODARONE HCL IN DEXTROSE 360-4.14 MG/200ML-% IV SOLN: 30.0000 mg/h | INTRAVENOUS | Status: DC

## 2016-09-14 MED ORDER — AMIODARONE HCL IN DEXTROSE 360-4.14 MG/200ML-% IV SOLN
60.0000 mg/h | INTRAVENOUS | Status: DC
  Administered 2016-09-14: 60 mg/h via INTRAVENOUS
  Filled 2016-09-14: qty 200

## 2016-09-14 MED ORDER — DEXMEDETOMIDINE HCL IN NACL 200 MCG/50ML IV SOLN
0.4000 ug/kg/h | INTRAVENOUS | Status: DC
  Administered 2016-09-14: 0.6 ug/kg/h via INTRAVENOUS
  Administered 2016-09-14: 0.5 ug/kg/h via INTRAVENOUS
  Filled 2016-09-14 (×2): qty 50

## 2016-09-14 MED ORDER — VANCOMYCIN HCL IN DEXTROSE 750-5 MG/150ML-% IV SOLN
750.0000 mg | Freq: Two times a day (BID) | INTRAVENOUS | Status: DC
  Administered 2016-09-14 – 2016-09-16 (×6): 750 mg via INTRAVENOUS
  Filled 2016-09-14 (×7): qty 150

## 2016-09-14 NOTE — Progress Notes (Signed)
ANTICOAGULATION CONSULT NOTE - Initial Consult  Pharmacy Consult for Heparin Indication: atrial fibrillation  No Known Allergies  Patient Measurements: Height: 6\' 1"  (185.4 cm) Weight: 195 lb 5.2 oz (88.6 kg) IBW/kg (Calculated) : 79.9 Heparin Dosing Weight: ABW  Vital Signs: Temp: 100.6 F (38.1 C) (02/25 0722) Temp Source: Axillary (02/25 0722) BP: 132/75 (02/25 0900) Pulse Rate: 97 (02/25 0900)  Labs:  Recent Labs  09/12/16 0410 09/13/16 0306 09/13/16 0529 09/14/16 0001 09/14/16 0406  HGB 8.9* 9.8*  --   --  9.2*  HCT 28.6* 32.0*  --   --  30.0*  PLT 270 322  --   --  349  CREATININE 1.89*  --  1.66* 1.55*  --     Estimated Creatinine Clearance: 49.4 mL/min (by C-G formula based on SCr of 1.55 mg/dL (H)).   Medical History: History reviewed. No pertinent past medical history.  Assessment: 71 yo male with post-op afib w RVR, s/p repair of ruptured AAA. CBC stable, wnl.   Goal of Therapy:  Heparin level 0.3-0.7 units/ml Monitor platelets by anticoagulation protocol: Yes   Plan:  Subcut lovenox d/c'd Start heparin IV at 1300 units/hr - no bolus 8hr heparin level @1800  Daily heparin level, CBC Monitor for s/sx bleeding, transition to PO AC  Allena Katzaroline E Greta Yung, Pharm.D. PGY1 Pharmacy Resident 2/25/20189:41 AM Pager [phone]

## 2016-09-14 NOTE — Progress Notes (Signed)
eLink Physician-Brief Progress Note Patient Name: Michael Heath DOB: September 30, 1945 MRN: [id number]   Date of Service  09/14/2016  HPI/Events of Note  Hypokalemia  eICU Interventions  Potassium replaced     Intervention Category Minor Interventions: Electrolytes abnormality - evaluation and management  DETERDING,ELIZABETH 09/14/2016, 12:42 AM

## 2016-09-14 NOTE — Progress Notes (Signed)
Pt with frequent SB episodes as low as 30-40's; weaning Precedex to off; Amio gtt d/c earlier; will cont. To monitor.  Benjamine SpragueYates, Dioselina Brumbaugh A

## 2016-09-14 NOTE — Progress Notes (Addendum)
Progress Note  Patient Name: Michael Heath Date of Encounter: 09/14/2016  Primary Cardiologist: Mendel Ryder   Subjective   71 yo with hx of CAD , HTN and ruptured AAA. Now , s/p repair  Developed post-op atrial fib -> converted back to NSR.   Developed recurrent AF with RVR this am. IV cardizem started. Now in/out AF-> sinus tach  Back on bipap at 60%. Confused. Tachypneic.   Lasix stopped yesterday and placed on gentle hydration for hypernatremia. Serum sodium 151->150  Failed swallow study and speech suggesting ENT eval.   Inpatient Medications    Scheduled Meds: . aspirin  325 mg Per Tube Daily  . chlorhexidine  15 mL Mouth Rinse BID  . Chlorhexidine Gluconate Cloth  6 each Topical Daily  . docusate  100 mg Oral Daily  . enoxaparin (LOVENOX) injection  40 mg Subcutaneous Q24H  . mouth rinse  15 mL Mouth Rinse q12n4p  . metoprolol  5 mg Intravenous Q6H  . pantoprazole (PROTONIX) IV  40 mg Intravenous Q12H  . sodium chloride flush  10-40 mL Intracatheter Q12H   Continuous Infusions: . sodium chloride 10 mL/hr at 09/13/16 0700  . dextrose 5 % and 0.45 % NaCl with KCl 40 mEq/L 75 mL/hr at 09/14/16 0800  . dilTIAZem HCl-Dextrose 15 mg/hr (09/14/16 0800)   PRN Meds: sodium chloride, albuterol, artificial tears, bisacodyl, fentaNYL (SUBLIMAZE) injection, hydrALAZINE, labetalol, midazolam, ondansetron, phenol, sodium chloride flush   Vital Signs    Vitals:   09/14/16 0712 09/14/16 0722 09/14/16 0800 09/14/16 0830  BP:   118/77   Pulse: (!) 123  (!) 134 (!) 133  Resp: (!) 34  (!) 37 (!) 37  Temp:  (!) 100.6 F (38.1 C)    TempSrc:  Axillary    SpO2: 94%  97% 98%  Weight:      Height:        Intake/Output Summary (Last 24 hours) at 09/14/16 0843 Last data filed at 09/14/16 0800  Gross per 24 hour  Intake          2489.58 ml  Output             2800 ml  Net          -310.42 ml   Filed Weights   09/12/16 0500 09/13/16 0600 09/14/16 0600  Weight: 101.7 kg (224  lb 3.3 oz) 97.9 kg (215 lb 13.3 oz) 88.6 kg (195 lb 5.2 oz)    Telemetry    Atrial fibrillation 130-140s  Personally reviewed    Physical Exam   GEN:  on bipap. Confused. Wearing mittens Neck: No JVD supple  Cardiac: irr.irr tachy , no murmurs, rubs, or gallops.  Respiratory: crackles. The expiratory wheezes. GI: surgical dressing ok. +distension. Hypoactive BS. NT MS: warm. Trace edema;  Neuro:   confused Psych: Normal   Labs    Chemistry Recent Labs Lab 09/08/16 0318 09/09/16 0501  09/12/16 0410 09/13/16 0529 09/14/16 0001  NA 144 142  < > 145 151* 150*  K 3.6 2.8*  < > 2.6* 2.8* 3.0*  CL 106 103  < > 104 107 111  CO2 29 31  < > 33* 36* 31  GLUCOSE 142* 130*  < > 189* 139* 136*  BUN 37* 41*  < > 54* 51* 47*  CREATININE 1.84* 1.88*  < > 1.89* 1.66* 1.55*  CALCIUM 7.8* 7.4*  < > 7.9* 8.4* 8.1*  PROT 5.3* 5.4*  --   --   --   --  ALBUMIN 1.5* 1.4*  --   --   --   --   AST 85* 82*  --   --   --   --   ALT 32 35  --   --   --   --   ALKPHOS 42 50  --   --   --   --   BILITOT 5.9* 4.7*  --   --   --   --   GFRNONAA 35* 34*  < > 34* 40* 43*  GFRAA 41* 40*  < > 40* 46* 50*  ANIONGAP 9 8  < > < > = values in this interval not displayed.   Hematology  Recent Labs Lab 09/12/16 0410 09/13/16 0306 09/14/16 0406  WBC 16.1* 19.3* 16.7*  RBC 2.97* 3.25* 3.02*  HGB 8.9* 9.8* 9.2*  HCT 28.6* 32.0* 30.0*  MCV 96.3 98.5 99.3  MCH 30.0 30.2 30.5  MCHC 31.1 30.6 30.7  RDW 18.0* 18.3* 18.8*  PLT 270 322 349    Cardiac Enzymes  Recent Labs Lab 09/07/16 2128 09/08/16 0318 09/09/16 0501  TROPONINI <0.03 <0.03 <0.03   No results for input(s): TROPIPOC in the last 168 hours.   BNPNo results for input(s): BNP, PROBNP in the last 168 hours.   DDimer No results for input(s): DDIMER in the last 168 hours.   Radiology    Dg Chest Port 1 View  Result Date: 09/13/2016 CLINICAL DATA:  Abnormal respiration. EXAM: PORTABLE CHEST 1 VIEW COMPARISON:  09/12/2016  and CT chest 09/17/2016. FINDINGS: Trachea is midline. Interval extubation. Left PICC tip projects over the SVC RA junction or high right atrium. Heart is enlarged, stable. Minimal left basilar volume loss and a tiny bilateral pleural effusions. IMPRESSION: Minimal bibasilar volume loss and tiny bilateral pleural effusions. Electronically Signed   By: Leanna Battles M.D.   On: 09/13/2016 08:03   Dg Swallowing Func-speech Pathology  Result Date: 09/13/2016 Objective Swallowing Evaluation: Type of Study: MBS-Modified Barium Swallow Study Patient Details Name: Michael Heath MRN: [id number] Date of Birth: [birth year] Today's Date: 09/13/2016 Time: SLP Start Time (ACUTE ONLY): 1202-SLP Stop Time (ACUTE ONLY): 1230 SLP Time Calculation (min) (ACUTE ONLY): 28 min Past Medical History: No past medical history on file. Past Surgical History: Past Surgical History: Procedure Laterality Date . ABDOMINAL AORTIC ENDOVASCULAR STENT GRAFT N/A 09/04/2016  Procedure: ABDOMINAL AORTIC ENDOVASCULAR STENT GRAFT;  Surgeon: Nada Libman, MD;  Location: [facility] OR;  Service: Vascular;  Laterality: N/A; . ABDOMINAL EXPOSURE  09/05/2016  Procedure: Abdominal Exploration;  Surgeon: Nada Libman, MD;  Location: [facility] OR;  Service: Vascular;; . APPLICATION OF WOUND VAC  09/15/2016  Procedure: APPLICATION OF WOUND VAC;  Surgeon: Nada Libman, MD;  Location: [facility] OR;  Service: Vascular;; . CATARACT EXTRACTION W/PHACO Right 03/2010 . CATARACT EXTRACTION W/PHACO Left 02/24/2013  Procedure: CATARACT EXTRACTION PHACO AND INTRAOCULAR LENS PLACEMENT (IOC);  Surgeon: Gemma Payor, MD;  Location: [facility] ORS;  Service: Ophthalmology;  Laterality: Left;  CDE: 13.82 . ESOPHAGOGASTRODUODENOSCOPY N/A 09/14/2016  Procedure: ESOPHAGOGASTRODUODENOSCOPY (EGD);  Surgeon: Napoleon Form, MD;  Location: [facility] ENDOSCOPY;  Service: Endoscopy;  Laterality: N/A; . EYE SURGERY   . HEMATOMA EVACUATION  09/09/2016  Procedure: EVACUATION HEMATOMA;  Surgeon: Nada Libman, MD;   Location: [facility] OR;  Service: Vascular;; . LAPAROTOMY N/A 09-14-16  Procedure: EXPLORATORY LAPAROTOMY;  Surgeon: Nada Libman, MD;  Location: [facility] OR;  Service: Vascular;  Laterality: N/A; HPI: 71 year old with past medical history  of hypertension, hyperlipidemia, CKD, stage III. Admitted today with right flank, back pain. A CTA which showed ruptured AAA. He was transferred emergently to [facility] for stent repair. He developed abdominal comparment syndrome in OR from massive RP bleed. Patient with hypoxemia and resp failure after receiveing significant amounts of blood products. Intubated 2/13- 2/23. No prior swallowing evaluations found in chart, but MD notes "a couple of years history history of dysphagia" which was never brought to the attention of his physician. MRI 2/20 showed Multifocal acute ischemia, including the left frontal watershed region, possibly secondary to an acute hypotensive episode. The other scattered punctate foci of ischemia are more suggestive of a central cardio embolic process. Subjective: Pt arrives for MBS with RN Assessment / Plan / Recommendation CHL IP CLINICAL IMPRESSIONS 09/13/2016 Clinical Impression Patient presents with moderate oral and severe pharyngeal dysphagia with frank aspiration of thin liquids, penetration of honey-thick liquids and pureed solids with suspected aspiration of material mixed with secretions. Oral stage of swallowing noted for lingual pumping, weak lingual manipulation, delayed oral transit and retrograde movement of bolus from base of tongue to the oral cavity (puree). Swallow is delayed to the pyriform sinuses with thin, and after spillover the epiglottis with honey and pureed consistencies. Noted reduced BOT retraction. Radiologist confirmed presence of cervical osteophytes and protrusion of posterior pharyngeal wall in initial image. SLP noted during examination pt with unilateral posterior pharyngeal protrusion which impeded epiglottic deflection and airway  closure, leading to severe residue in the valleculae and posterior pharyngeal wall with all consistencies (thin, honey-thick liquids and pureed), unilateral bolus flow, penetration/aspiration during and after the swallow. Recommend ENT consult to rule out osteophytes vs tissue anomaly in the pharynx. Pt is at severe risk for aspiration; recommend NPO with ice chips PRN after oral care. Patient will likely need temporary alternative means of nutrition/hydration. Patient may also benefit from cognitive-linguistic evaluation. SLP will follow up to determine readiness for PO intake, follow-up instrumental as appropriate.  SLP Visit Diagnosis Dysphagia, oropharyngeal phase (R13.12) Attention and concentration deficit following -- Frontal lobe and executive function deficit following -- Impact on safety and function Severe aspiration risk   CHL IP TREATMENT RECOMMENDATION 09/13/2016 Treatment Recommendations F/U FEES in --- days (Comment);Therapy as outlined in treatment plan below   Prognosis 09/13/2016 Prognosis for Safe Diet Advancement Good Barriers to Reach Goals Severity of deficits Barriers/Prognosis Comment -- CHL IP DIET RECOMMENDATION 09/13/2016 SLP Diet Recommendations NPO Liquid Administration via -- Medication Administration Via alternative means Compensations -- Postural Changes --   CHL IP OTHER RECOMMENDATIONS 09/13/2016 Recommended Consults Consider ENT evaluation Oral Care Recommendations Oral care QID Other Recommendations --   CHL IP FOLLOW UP RECOMMENDATIONS 09/13/2016 Follow up Recommendations Other (comment)   CHL IP FREQUENCY AND DURATION 09/13/2016 Speech Therapy Frequency (ACUTE ONLY) min 2x/week Treatment Duration 2 weeks      CHL IP ORAL PHASE 09/13/2016 Oral Phase Impaired Oral - Pudding Teaspoon -- Oral - Pudding Cup -- Oral - Honey Teaspoon Lingual pumping;Weak lingual manipulation;Delayed oral transit Oral - Honey Cup -- Oral - Nectar Teaspoon -- Oral - Nectar Cup -- Oral - Nectar Straw -- Oral -  Thin Teaspoon Right anterior bolus loss Oral - Thin Cup -- Oral - Thin Straw -- Oral - Puree Weak lingual manipulation;Lingual pumping;Reduced posterior propulsion;Delayed oral transit Oral - Mech Soft -- Oral - Regular -- Oral - Multi-Consistency -- Oral - Pill -- Oral Phase - Comment --  CHL IP PHARYNGEAL PHASE 09/13/2016 Pharyngeal Phase Impaired Pharyngeal-  Pudding Teaspoon -- Pharyngeal -- Pharyngeal- Pudding Cup -- Pharyngeal -- Pharyngeal- Honey Teaspoon Delayed swallow initiation-pyriform sinuses;Reduced epiglottic inversion;Reduced airway/laryngeal closure;Reduced tongue base retraction;Penetration/Apiration after swallow;Pharyngeal residue - valleculae;Pharyngeal residue - pyriform;Pharyngeal residue - posterior pharnyx Pharyngeal Material enters airway, remains ABOVE vocal cords and not ejected out Pharyngeal- Honey Cup -- Pharyngeal -- Pharyngeal- Nectar Teaspoon -- Pharyngeal -- Pharyngeal- Nectar Cup -- Pharyngeal -- Pharyngeal- Nectar Straw -- Pharyngeal -- Pharyngeal- Thin Teaspoon Delayed swallow initiation-pyriform sinuses;Reduced epiglottic inversion;Reduced tongue base retraction;Reduced airway/laryngeal closure;Penetration/Aspiration during swallow;Moderate aspiration;Pharyngeal residue - valleculae;Pharyngeal residue - posterior pharnyx;Pharyngeal residue - pyriform Pharyngeal Material enters airway, passes BELOW cords without attempt by patient to eject out (silent aspiration) Pharyngeal- Thin Cup -- Pharyngeal -- Pharyngeal- Thin Straw -- Pharyngeal -- Pharyngeal- Puree Delayed swallow initiation-pyriform sinuses;Penetration/Apiration after swallow;Reduced tongue base retraction;Reduced airway/laryngeal closure;Pharyngeal residue - valleculae;Pharyngeal residue - pyriform;Pharyngeal residue - posterior pharnyx Pharyngeal Material enters airway, remains ABOVE vocal cords and not ejected out Pharyngeal- Mechanical Soft -- Pharyngeal -- Pharyngeal- Regular -- Pharyngeal -- Pharyngeal-  Multi-consistency -- Pharyngeal -- Pharyngeal- Pill -- Pharyngeal -- Pharyngeal Comment --  CHL IP CERVICAL ESOPHAGEAL PHASE 09/13/2016 Cervical Esophageal Phase WFL Pudding Teaspoon -- Pudding Cup -- Honey Teaspoon -- Honey Cup -- Nectar Teaspoon -- Nectar Cup -- Nectar Straw -- Thin Teaspoon -- Thin Cup -- Thin Straw -- Puree -- Mechanical Soft -- Regular -- Multi-consistency -- Pill -- Cervical Esophageal Comment -- Rondel Baton, MS CF-SLP Speech-Language Pathologist [phone] No flowsheet data found. Arlana Lindau 09/13/2016, 1:40 PM               Patient Profile     71 y.o. male with ruptured AAA .     Assessment & Plan    1. Post-op AF     --recurrent AF with RVR this am. Will stop diltiazem. Load amio Will start heparin    --CHADSvasc = 3-4 2. Volume overload. - now resolved. He is actually dry.     --failed swallow study today. Will resume gentle hydration 3. Ruptured AAA with ab compartment syndrome    --s/p EXAR and ex-lap for compartment syndrome 4. Acute resp failure    --intubated 2/13-2/23   --now back on Bipap at 60%. May need re-intubation. CCM following.    --I will get CXR now 5. Hypernatremia    --getting gentle IVF until taking pos.  6. AKI     --improving 7. Hypokalemia     --will supplement 8. CAD, stable     --no ischemia. Continue ASA. Start statin when taking pos.   The patient is critically ill with multiple organ systems failure and requires high complexity decision making for assessment and support, frequent evaluation and titration of therapies, application of advanced monitoring technologies and extensive interpretation of multiple databases.   Critical Care Time devoted to patient care services described in this note is 35 Minutes.    Migdalia Dk, MD  09/14/2016, 8:43 AM

## 2016-09-14 NOTE — Progress Notes (Signed)
PULMONARY / CRITICAL CARE MEDICINE   Name: Michael Heath MRN: [id number] DOB: 07/14/1946    ADMISSION DATE:  09/13/2016 CONSULTATION DATE:  08/21/2016  REFERRING MD:  Coral Else MD  CHIEF COMPLAINT:  Ruptured AAA aneurysm status post repair, acute hypoxic respiratory failure.  brief 71 year old with past medical history of hypertension, hyperlipidemia, CKD, stage III. Admitted today with right flank, back pain. A CTA which showed ruptured AAA. He was transferred emergently to [facility] for stent repair. In the OR he received 16 units PRBC, 10 units FFP, 3 units platelets, 1 unit cryo and 1 Cell Saver. He developed abdominal comparment syndrome in OR from massive RP bleed. He is brought back to the ICU with an open abdomen. PCCM consulted for vent, sedation management.  CT Angio chest, abdomen 09/06/2016- ruptured 7.8 cm abdominal aortic aneurysm with a large RP hematoma. Moderate emphysema. No focal pneumonia, consolidation, opacities. CXR 09/01/16- COPD, no active cardio pulmonary disease All images personally reviewed. Echo 2/15 > N EF, diastolic dysfxn   SIGNIFICANT EVENTS: 2/13- Admit, OR for ruptured AAA. 2/14- Back to OR for abdomina closure.  2/18 -   Weaned off pressors 2/17 . B/p stable this am  .  Still not following commands, limited responsiveness  Versed stopped this am . Weaning fentanyl -increased wob  Wife at bedside , updated.  Trickle feeds started 2/17  2./19 - RUE weakness overnight; CT head negative. Off pressors. Volume overloaded. Wife and grandson at bedside. RASS -3 off sedation gtt x 1h   2/20 - down to 60% fio2, peep 10. RUE improved. Coming down in sedation gtt. Less volume overloaded. SVT + in RUE  09/10/2016 - MRI with water shet infarct. Uanble to tolerate prn sedation. Diuresing. Still 11L positive. Back in A Fib RVR and on cadizem. Cards/neuro recommendeding anticooagulation when more stable after surgery. ASA only now. Bialteral 1-39% CEA stenosis on  [place]   09/11/16 - volumes status down to 4L Positive  2./23 - thirstay. Extubated to bipap. Lasix reduced  . 2/24 - only 200cc positive (from 16L positive on 2/17). Remains extubaed. VOice hoarse.On 10L [place] without disrees    SUBJECTIVE/OVERNIGHT/INTERVAL HX 2/25 - hypoactive delirum and resp distress on bipap  VITAL SIGNS: BP 132/75   Pulse 97   Temp (!) 100.6 F (38.1 C) (Axillary)   Resp (!) 26   Ht 6\' 1"  (1.854 m)   Wt 88.6 kg (195 lb 5.2 oz)   SpO2 97%   BMI 25.77 kg/m   HEMODYNAMICS:    VENTILATOR SETTINGS: Vent Mode: BIPAP FiO2 (%):  [50 %-60 %] 60 % Set Rate:  [10 bmp] 10 bmp PEEP:  [5 cmH20] 5 cmH20  INTAKE / OUTPUT: I/O last 3 completed shifts: In: 2860.8 [I.V.:2065.8; IV Piggyback:795] Out: 6010 [Urine:6010]  PHYSICAL EXAMINATION:  General Appearance:    Looks worse  Head:    Normocephalic, without obvious abnormality, atraumatic  Eyes:    PERRL - yes, conjunctiva/corneas - clear      Ears:    Normal external ear canals, both ears  Nose:   NG tube - no  Throat:  ETT TUBE - no , OG tube - no. VOICE HOARSE  Neck:   Supple,  No enlargement/tenderness/nodules     Lungs:    Tachypneic RR30, Mildly paradoxucal  Chest wall:    No deformity  Heart:    S1 and S2 normal, no murmur, CVP - na.  Pressors - none but on cardizem gtt. HR 71 and  sinus  Abdomen:     Soft, no masses, no organomegaly  Genitalia:    Not done  Rectal:   not done  Extremities:   Extremities- improved edema.      Skin:   Intact in exposed areas . Sacral area - do not know     Neurologic:   Sedation - none -> RASS - -2 . Confused         LABS:   PULMONARY No results for input(s): PHART, PCO2ART, PO2ART, HCO3, TCO2, O2SAT in the last 168 hours.  Invalid input(s): PCO2, PO2  CBC  Recent Labs Lab 09/12/16 0410 09/13/16 0306 09/14/16 0406  HGB 8.9* 9.8* 9.2*  HCT 28.6* 32.0* 30.0*  WBC 16.1* 19.3* 16.7*  PLT 270 322 349    COAGULATION No results for input(s): INR in  the last 168 hours.  CARDIAC    Recent Labs Lab 09/07/16 2128 09/08/16 0318 09/09/16 0501  TROPONINI <0.03 <0.03 <0.03   No results for input(s): PROBNP in the last 168 hours.   CHEMISTRY  Recent Labs Lab 09/10/16 0537 09/10/16 0539 09/11/16 0400 09/11/16 1500 09/12/16 0410 09/13/16 0306 09/13/16 0529 09/14/16 0001 09/14/16 0406  NA 146*  --   --  149* 145  --  151* 150*  --   K 3.4*  --   --  2.6* 2.6*  --  2.8* 3.0*  --   CL 103  --   --  105 104  --  107 111  --   CO2 34*  --   --  36* 33*  --  36* 31  --   GLUCOSE 138*  --   --  156* 189*  --  139* 136*  --   BUN 47*  --   --  53* 54*  --  51* 47*  --   CREATININE 2.00*  --   --  2.03* 1.89*  --  1.66* 1.55*  --   CALCIUM 7.9*  --   --  8.0* 7.9*  --  8.4* 8.1*  --   MG  --  2.2 2.3  --  2.2 2.4  --   --  2.2  PHOS  --  2.9 2.8  --  3.1 3.8  --   --  2.6   Estimated Creatinine Clearance: 49.4 mL/min (by C-G formula based on SCr of 1.55 mg/dL (H)).   LIVER  Recent Labs Lab 09/08/16 0318 09/09/16 0501  AST 85* 82*  ALT 32 35  ALKPHOS 42 50  BILITOT 5.9* 4.7*  PROT 5.3* 5.4*  ALBUMIN 1.5* 1.4*     INFECTIOUS  Recent Labs Lab 09/08/16 0320 09/10/16 0539 09/11/16 0400 09/12/16 0410  LATICACIDVEN 1.7  --   --   --   PROCALCITON  --  0.91 0.54 0.71     ENDOCRINE CBG (last 3)   Recent Labs  09/13/16 1918 09/14/16 0023 09/14/16 0717  GLUCAP 118* 121* 128*         IMAGING x48h  - image(s) personally visualized  -   highlighted in bold Dg Chest Port 1 View  Result Date: 09/13/2016 CLINICAL DATA:  Abnormal respiration. EXAM: PORTABLE CHEST 1 VIEW COMPARISON:  09/12/2016 and CT chest 09/08/2016. FINDINGS: Trachea is midline. Interval extubation. Left PICC tip projects over the SVC RA junction or high right atrium. Heart is enlarged, stable. Minimal left basilar volume loss and a tiny bilateral pleural effusions. IMPRESSION: Minimal bibasilar volume loss and tiny bilateral pleural  effusions. Electronically Signed  By: Leanna Battles M.D.   On: 09/13/2016 08:03   Dg Swallowing Func-speech Pathology  Result Date: 09/13/2016 Objective Swallowing Evaluation: Type of Study: MBS-Modified Barium Swallow Study Patient Details Name: Michael Heath MRN: [id number] Date of Birth: 19-Mar-1946 Today's Date: 09/13/2016 Time: SLP Start Time (ACUTE ONLY): 1202-SLP Stop Time (ACUTE ONLY): 1230 SLP Time Calculation (min) (ACUTE ONLY): 28 min Past Medical History: No past medical history on file. Past Surgical History: Past Surgical History: Procedure Laterality Date . ABDOMINAL AORTIC ENDOVASCULAR STENT GRAFT N/A 2016-09-20  Procedure: ABDOMINAL AORTIC ENDOVASCULAR STENT GRAFT;  Surgeon: Nada Libman, MD;  Location: [facility] OR;  Service: Vascular;  Laterality: N/A; . ABDOMINAL EXPOSURE  09/20/2016  Procedure: Abdominal Exploration;  Surgeon: Nada Libman, MD;  Location: [facility] OR;  Service: Vascular;; . APPLICATION OF WOUND VAC  2016-09-20  Procedure: APPLICATION OF WOUND VAC;  Surgeon: Nada Libman, MD;  Location: [facility] OR;  Service: Vascular;; . CATARACT EXTRACTION W/PHACO Right 03/2010 . CATARACT EXTRACTION W/PHACO Left 02/24/2013  Procedure: CATARACT EXTRACTION PHACO AND INTRAOCULAR LENS PLACEMENT (IOC);  Surgeon: Gemma Payor, MD;  Location: [facility] ORS;  Service: Ophthalmology;  Laterality: Left;  CDE: 13.82 . ESOPHAGOGASTRODUODENOSCOPY N/A 09/05/2016  Procedure: ESOPHAGOGASTRODUODENOSCOPY (EGD);  Surgeon: Napoleon Form, MD;  Location: [facility] ENDOSCOPY;  Service: Endoscopy;  Laterality: N/A; . EYE SURGERY   . HEMATOMA EVACUATION  09-20-16  Procedure: EVACUATION HEMATOMA;  Surgeon: Nada Libman, MD;  Location: [facility] OR;  Service: Vascular;; . LAPAROTOMY N/A 09/02/2016  Procedure: EXPLORATORY LAPAROTOMY;  Surgeon: Nada Libman, MD;  Location: [facility] OR;  Service: Vascular;  Laterality: N/A; HPI: 71 year old with past medical history of hypertension, hyperlipidemia, CKD, stage III. Admitted today with right flank, back  pain. A CTA which showed ruptured AAA. He was transferred emergently to [facility] for stent repair. He developed abdominal comparment syndrome in OR from massive RP bleed. Patient with hypoxemia and resp failure after receiveing significant amounts of blood products. Intubated 2/13- 2/23. No prior swallowing evaluations found in chart, but MD notes "a couple of years history history of dysphagia" which was never brought to the attention of his physician. MRI 2/20 showed Multifocal acute ischemia, including the left frontal watershed region, possibly secondary to an acute hypotensive episode. The other scattered punctate foci of ischemia are more suggestive of a central cardio embolic process. Subjective: Pt arrives for MBS with RN Assessment / Plan / Recommendation CHL IP CLINICAL IMPRESSIONS 09/13/2016 Clinical Impression Patient presents with moderate oral and severe pharyngeal dysphagia with frank aspiration of thin liquids, penetration of honey-thick liquids and pureed solids with suspected aspiration of material mixed with secretions. Oral stage of swallowing noted for lingual pumping, weak lingual manipulation, delayed oral transit and retrograde movement of bolus from base of tongue to the oral cavity (puree). Swallow is delayed to the pyriform sinuses with thin, and after spillover the epiglottis with honey and pureed consistencies. Noted reduced BOT retraction. Radiologist confirmed presence of cervical osteophytes and protrusion of posterior pharyngeal wall in initial image. SLP noted during examination pt with unilateral posterior pharyngeal protrusion which impeded epiglottic deflection and airway closure, leading to severe residue in the valleculae and posterior pharyngeal wall with all consistencies (thin, honey-thick liquids and pureed), unilateral bolus flow, penetration/aspiration during and after the swallow. Recommend ENT consult to rule out osteophytes vs tissue anomaly in the pharynx. Pt is at severe  risk for aspiration; recommend NPO with ice chips PRN after oral care. Patient will likely need temporary alternative means of  nutrition/hydration. Patient may also benefit from cognitive-linguistic evaluation. SLP will follow up to determine readiness for PO intake, follow-up instrumental as appropriate.  SLP Visit Diagnosis Dysphagia, oropharyngeal phase (R13.12) Attention and concentration deficit following -- Frontal lobe and executive function deficit following -- Impact on safety and function Severe aspiration risk   CHL IP TREATMENT RECOMMENDATION 09/13/2016 Treatment Recommendations F/U FEES in --- days (Comment);Therapy as outlined in treatment plan below   Prognosis 09/13/2016 Prognosis for Safe Diet Advancement Good Barriers to Reach Goals Severity of deficits Barriers/Prognosis Comment -- CHL IP DIET RECOMMENDATION 09/13/2016 SLP Diet Recommendations NPO Liquid Administration via -- Medication Administration Via alternative means Compensations -- Postural Changes --   CHL IP OTHER RECOMMENDATIONS 09/13/2016 Recommended Consults Consider ENT evaluation Oral Care Recommendations Oral care QID Other Recommendations --   CHL IP FOLLOW UP RECOMMENDATIONS 09/13/2016 Follow up Recommendations Other (comment)   CHL IP FREQUENCY AND DURATION 09/13/2016 Speech Therapy Frequency (ACUTE ONLY) min 2x/week Treatment Duration 2 weeks      CHL IP ORAL PHASE 09/13/2016 Oral Phase Impaired Oral - Pudding Teaspoon -- Oral - Pudding Cup -- Oral - Honey Teaspoon Lingual pumping;Weak lingual manipulation;Delayed oral transit Oral - Honey Cup -- Oral - Nectar Teaspoon -- Oral - Nectar Cup -- Oral - Nectar Straw -- Oral - Thin Teaspoon Right anterior bolus loss Oral - Thin Cup -- Oral - Thin Straw -- Oral - Puree Weak lingual manipulation;Lingual pumping;Reduced posterior propulsion;Delayed oral transit Oral - Mech Soft -- Oral - Regular -- Oral - Multi-Consistency -- Oral - Pill -- Oral Phase - Comment --  CHL IP PHARYNGEAL PHASE  09/13/2016 Pharyngeal Phase Impaired Pharyngeal- Pudding Teaspoon -- Pharyngeal -- Pharyngeal- Pudding Cup -- Pharyngeal -- Pharyngeal- Honey Teaspoon Delayed swallow initiation-pyriform sinuses;Reduced epiglottic inversion;Reduced airway/laryngeal closure;Reduced tongue base retraction;Penetration/Apiration after swallow;Pharyngeal residue - valleculae;Pharyngeal residue - pyriform;Pharyngeal residue - posterior pharnyx Pharyngeal Material enters airway, remains ABOVE vocal cords and not ejected out Pharyngeal- Honey Cup -- Pharyngeal -- Pharyngeal- Nectar Teaspoon -- Pharyngeal -- Pharyngeal- Nectar Cup -- Pharyngeal -- Pharyngeal- Nectar Straw -- Pharyngeal -- Pharyngeal- Thin Teaspoon Delayed swallow initiation-pyriform sinuses;Reduced epiglottic inversion;Reduced tongue base retraction;Reduced airway/laryngeal closure;Penetration/Aspiration during swallow;Moderate aspiration;Pharyngeal residue - valleculae;Pharyngeal residue - posterior pharnyx;Pharyngeal residue - pyriform Pharyngeal Material enters airway, passes BELOW cords without attempt by patient to eject out (silent aspiration) Pharyngeal- Thin Cup -- Pharyngeal -- Pharyngeal- Thin Straw -- Pharyngeal -- Pharyngeal- Puree Delayed swallow initiation-pyriform sinuses;Penetration/Apiration after swallow;Reduced tongue base retraction;Reduced airway/laryngeal closure;Pharyngeal residue - valleculae;Pharyngeal residue - pyriform;Pharyngeal residue - posterior pharnyx Pharyngeal Material enters airway, remains ABOVE vocal cords and not ejected out Pharyngeal- Mechanical Soft -- Pharyngeal -- Pharyngeal- Regular -- Pharyngeal -- Pharyngeal- Multi-consistency -- Pharyngeal -- Pharyngeal- Pill -- Pharyngeal -- Pharyngeal Comment --  CHL IP CERVICAL ESOPHAGEAL PHASE 09/13/2016 Cervical Esophageal Phase WFL Pudding Teaspoon -- Pudding Cup -- Honey Teaspoon -- Honey Cup -- Nectar Teaspoon -- Nectar Cup -- Nectar Straw -- Thin Teaspoon -- Thin Cup -- Thin Straw --  Puree -- Mechanical Soft -- Regular -- Multi-consistency -- Pill -- Cervical Esophageal Comment -- Rondel Baton, MS CF-SLP Speech-Language Pathologist [phone] No flowsheet data found. Arlana Lindau 09/13/2016, 1:40 PM                DISCUSSION: Patient admitted  for acute AAA rupture and had endovascular repair of ruptured AAA and ex-lap for abdominal compartment syndrome. Abdomen was kept open after procedure then kept intubated post op. Patient with hypoxemia and resp failure  after receiveing significant amounts of blood products. Pt went back to OR on 2/14 for abdominal closure. Remains on the vent.    Patient Active Problem List   Diagnosis Date Noted  . PAF (paroxysmal atrial fibrillation) (HCC)   . Cerebral thrombosis with cerebral infarction 09/11/2016  . Acute hypoxemic respiratory failure (HCC)   . AKI (acute kidney injury) (HCC)   . Dysphagia   . Elevated troponin   . Coronary artery calcification seen on CAT scan   . Ruptured abdominal aortic aneurysm (AAA) (HCC) 09/09/2016  . TIA (transient ischemic attack) 09/01/2016  . CKD (chronic kidney disease), stage III 01/28/2016  . HLD (hyperlipidemia) 12/27/2015  . Vitamin D deficiency 12/27/2015  . Elevated blood pressure reading without diagnosis of hypertension 12/25/2015  . Obesity 12/25/2015  . Healthcare maintenance 12/25/2015      ASSESSMENT / PLAN:  PULMONARY A: #baseline: heavy smoker, likely has copd #current: Acute resp failure, following aaa rutpture  09/14/2016 -> remains etubated but since 4am transitioned from HFNC to BiPAP following recurrence of A Fib RVR P:   Check ABG stat bipap continuous o2 for opusle ox > 88% Low threshold to intubae  CARDIOVASCULAR A:  #baseline: cad risk factors + #current AAA ruprutre at this admit with repair and closure 2/20 - RUE SVT + and new recurrent A Fibon cardizemg tt  09/14/2016 -> Went off cardizem yesterday due to bradycardia . THis AM back on RVR Afib and  on cardizem gtt. Currenntly in Sinus Tachy   P:  Map goal > 65 cardizem per cards IV heparin gtt;being started by cards Amio under consideration by cards  RENAL  Intake/Output Summary (Last 24 hours) at 09/14/16 0950 Last data filed at 09/14/16 0900  Gross per 24 hour  Intake          2569.58 ml  Output             2400 ml  Net           169.58 ml    Recent Labs Lab 09/10/16 0537 09/11/16 1500 09/12/16 0410 09/13/16 0529 09/14/16 0001  CREATININE 2.00* 2.03* 1.89* 1.66* 1.55*     A:   #baseline: ckd #current Creat 2.0mg  and rising with lasix. Mild low k repleted  09/14/2016 -improved volume overload; nearly resolved. Severe Low K  repleted by emd P:   Lasix on hold Replete K andlytes as needd  GASTROINTESTINAL A:   On TF Had difficulty with placement of OG tube - hx of dysphagia  2/24 - voice hoarse. FAiled swallow eval 09/14/16 - cortrak pending. Acute confusion prevents swallowing as well  P:   Cortrak pending NPO till then  HEMATOLOGIC  Recent Labs Lab 09/12/16 0410 09/13/16 0306 09/14/16 0406  HGB 8.9* 9.8* 9.2*  HCT 28.6* 32.0* 30.0*  WBC 16.1* 19.3* 16.7*  PLT 270 322 349    A:   #RBC: anemia of critical illness - stable anemia #Platelet ok #WBC stable leukocytosis  P:  - PRBC for hgb </= 6.9gm%    - exceptions are   -  if ACS susepcted/confirmed then transfuse for hgb </= 8.0gm%,  or    -  active bleeding with hemodynamic instability, then transfuse regardless of hemoglobin value   At at all times try to transfuse 1 unit prbc as possible with exception of active hemorrhage  - Cards starting IV heparin gtt  INFECTIOUS  Recent Labs Lab 09/10/16 0539 09/11/16 0400 09/12/16 0410  PROCALCITON 0.91 0.54 0.71  Results for orders placed or performed during the hospital encounter of 10/01/2016  MRSA PCR Screening     Status: None   Collection Time: 2016/10/01  8:47 PM  Result Value Ref Range Status   MRSA by PCR NEGATIVE NEGATIVE  Final    Comment:        The GeneXpert MRSA Assay (FDA approved for NASAL specimens only), is one component of a comprehensive MRSA colonization surveillance program. It is not intended to diagnose MRSA infection nor to guide or monitor treatment for MRSA infections.   Culture, respiratory (NON-Expectorated)     Status: None   Collection Time: 09/17/2016 12:24 PM  Result Value Ref Range Status   Specimen Description TRACHEAL ASPIRATE  Final   Special Requests Normal  Final   Gram Stain   Final    FEW WBC PRESENT,BOTH PMN AND MONONUCLEAR FEW SQUAMOUS EPITHELIAL CELLS PRESENT NO ORGANISMS SEEN    Culture Consistent with normal respiratory flora.  Final   Report Status 09/06/2016 FINAL  Final  Culture, blood (routine x 2)     Status: None   Collection Time: 09/04/16  4:11 PM  Result Value Ref Range Status   Specimen Description BLOOD LEFT HAND  Final   Special Requests IN PEDIATRIC BOTTLE 0.5CC  Final   Culture NO GROWTH 5 DAYS  Final   Report Status 09/09/2016 FINAL  Final  Culture, blood (routine x 2)     Status: None   Collection Time: 09/04/16  4:26 PM  Result Value Ref Range Status   Specimen Description BLOOD RIGHT ANTECUBITAL  Final   Special Requests BOTTLES DRAWN AEROBIC ONLY 5CC  Final   Culture NO GROWTH 5 DAYS  Final   Report Status 09/09/2016 FINAL  Final    A:   Possible Aspiration v HCAP  2/21- low grade fever +  2./245 - febrile to 101F again  P:   Pan culture and sepsi biloarmekers Restart antibiotics     ENDOCRINE A:   hyoperglicyema P:   ICU hyperglycemia protocol  NEUROLOGIC A:   #Baseline : negative CNS/Psych hx  #Current  - acute agitated encephalopathy post intubation - resolved 09/10/16  - New RUE weakness with watershed infacrt in Rt carotid terroty 09/09/16   09/14/2016 -> rcurrence of acute encephalopathy - mostly hyppactive but some agitation  P:   RASS goal: 0  fent prn Start precedex gtt vrsed orn   FAMILY  -  Updates: 09/14/2016 --> no family at bedside    - Inter-disciplinary family meet or Palliative Care meeting due by:  DAy 7. Current LOS is LOS 12 days - sdone 2/201/8 whic was day 7 - full medical care and full code   DISPO Worse 2/25 - high risk intubation. Do ABG, try precedex,, cnsintue bipap -> if fails intubate    Dr. Kalman Shan, M.D., [facility].C.P Pulmonary and Critical Care Medicine Staff Physician [facility] [place] [facility] Pager: [phone], If no answer or between  15:00h - 7:00h: call [phone]  09/14/2016 9:50 AM

## 2016-09-14 NOTE — Progress Notes (Signed)
SLP Cancellation Note  Patient Details Name: Michael Heath MRN: [id number] DOB: Sep 04, 1945   Cancelled treatment:       Reason Eval/Treat Not Completed: Medical issues which prohibited therapy. Pt has been on and off bipap today, currently on 11 L O2 via HFNC. RN reports pt mildly sedated. PO trials not appropriate at this time, will hold therapy today at RN discretion. SLP will follow up.  Rondel BatonMary Beth Clint Biello, [place] CF-SLP Speech-Language Pathologist [phone]   Michael Heath 09/14/2016, 2:44 PM

## 2016-09-14 NOTE — Progress Notes (Signed)
Patient taken off of bipap and placed on 11L high flow nasal cannula due to ABG results being within normal limits.  Will continue to monitor and assess for bipap needs.

## 2016-09-14 NOTE — Progress Notes (Signed)
Rehab Admissions Coordinator Note:  Patient was screened by Trish MageLogue, Charla Criscione M for appropriateness for an Inpatient Acute Rehab Consult.  At this time, we are recommending Inpatient Rehab consult.  Lelon FrohlichLogue, Loranda Mastel M 09/14/2016, 11:18 AM  I can be reached at [phone].

## 2016-09-14 NOTE — Progress Notes (Signed)
MD notified of periods of bradycardia with HR as low as 42; order to stop Amio gtt per MD; will cont. To monitor.  Benjamine SpragueYates, Stephane Niemann A

## 2016-09-14 NOTE — Progress Notes (Signed)
Pharmacy Antibiotic Note  Michael Heath is a 71 y.o. male admitted on 09/04/2016 with sepsis.  Pharmacy has been consulted for cefepime and vancomycin dosing.  Plan: Vancomycin  IV every 12 hours.  Goal trough 15-20 mcg/mL. Cefepime  IV every 8 hours  Height:  (185.4 cm) Weight: 195 lb 5.2 oz (88.6 kg) IBW/kg (Calculated) : 79.9  Temp (24hrs), Avg:99.3 F (37.4 C), Min:98.3 F (36.8 C), Max:100.6 F (38.1 C)   Recent Labs Lab 09/08/16 0320  09/10/16 0537 09/10/16 0539 09/11/16 0400 09/11/16 1500 09/12/16 0410 09/13/16 0306 09/13/16 0529 09/14/16 0001 09/14/16 0406  WBC  --   < >  --  14.4* 12.5*  --  16.1* 19.3*  --   --  16.7*  CREATININE  --   < > 2.00*  --   --  2.03* 1.89*  --  1.66* 1.55*  --   LATICACIDVEN 1.7  --   --   --   --   --   --   --   --   --   --   < > = values in this interval not displayed.  Estimated Creatinine Clearance: 49.4 mL/min (by C-G formula based on SCr of 1.55 mg/dL (H)).    No Known Allergies  Antimicrobials this admission: 2/15 Vanc>>2/20; 2/25>> 2/17 VT = 15 on  q12 2/15 Zosyn>>2/22 2/22 Cefepime>>2/24; 2/25>>   Microbiology results: 2/23 MRSA PCR>> neg 2/14 resp>> normal floraF 2/15 blood x2>> ngF  Thank you for allowing pharmacy to be a part of this patient's care.  Toniann Fail Wilhelm Ganaway 09/14/2016 10:28 AM

## 2016-09-14 NOTE — Progress Notes (Signed)
   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, moving L arm spontanenously, more confused this AM, restraints replaced  -> pain rx prn  PULM: back on Bipap this AM, keep in SICU  CV: back in afib on Dilt drip, appreciate Cardio input  GI: hypoBS, swallow study failed -> place SBFT, might wait until pulm status more stable to facilitate placement, may need PEG long-term  FEN: continue MIVF until PO started, would probably start with TPN  REN: essentially at neutral fluid balance  HEME/ID: concern for aspiration vs HCAP, abx: maxipime   Subjective  - 11 Days Post-Op  Confused this AM, BiPAP in place  Objective Vitals:   09/14/16 0600 09/14/16 0700 09/14/16 0712 09/14/16 0722  BP: (!) 141/110 113/75    Pulse: (!) 124 (!) 127 (!) 123   Resp: (!) 33 (!) 36 (!) 34   Temp:    (!) 100.6 F (38.1 C)  TempSrc:    Axillary  SpO2: 94% 96% 94%   Weight: 195 lb 5.2 oz (88.6 kg)     Height:         Intake/Output Summary (Last 24 hours) at 09/14/16 0802 Last data filed at 09/14/16 0700  Gross per 24 hour  Intake          2399.58 ml  Output             2800 ml  Net          -400.42 ml   NEURO confused, still moving L arm,  PULM  Bipap, B rales CV  Irr, irr GI  soft, +BS, -G/R VASC  Palp B DP (L>R)  Laboratory CBC    Component Value Date/Time   WBC 16.7 (H) 09/14/2016 0406   HGB 9.2 (L) 09/14/2016 0406   HCT 30.0 (L) 09/14/2016 0406   HCT 47.6 05/01/2016 1001   PLT 349 09/14/2016 0406   PLT 230 05/01/2016 1001    BMET    Component Value Date/Time   NA 150 (H) 09/14/2016 0001   NA 134 08/25/2016 1335   K 3.0 (L) 09/14/2016 0001   CL 111 09/14/2016 0001   CO2 31 09/14/2016 0001   GLUCOSE 136 (H) 09/14/2016 0001   BUN 47 (H) 09/14/2016 0001   BUN 26 08/25/2016 1335   CREATININE 1.55 (H) 09/14/2016 0001   CALCIUM 8.1 (L) 09/14/2016 0001   GFRNONAA 43 (L) 09/14/2016 0001   GFRAA 50 (L) 09/14/2016 0001     Leonides SakeBrian Staysha Truby, MD, FACS [facility] Office: [phone] Pager: [phone]  09/14/2016, 8:02 AM

## 2016-09-14 NOTE — Progress Notes (Addendum)
ANTICOAGULATION CONSULT NOTE - Follow Up Consult  Pharmacy Consult for Heparin Indication: atrial fibrillation  No Known Allergies  Patient Measurements: Height: 6\' 1"  (185.4 cm) Weight: 195 lb 5.2 oz (88.6 kg) IBW/kg (Calculated) : 79.9 Heparin Dosing Weight: 89 kg  Vital Signs: Temp: 100 F (37.8 C) (02/25 1144) Temp Source: Axillary (02/25 1144) BP: 131/63 (02/25 1300) Pulse Rate: 73 (02/25 1300)  Labs:  Recent Labs  09/12/16 0410 09/13/16 0306 09/13/16 0529 09/14/16 0001 09/14/16 0406 09/14/16 1009 09/14/16 1200  HGB 8.9* 9.8*  --   --  9.2*  --   --   HCT 28.6* 32.0*  --   --  30.0*  --   --   PLT 270 322  --   --  349  --   --   HEPARINUNFRC  --   --   --   --   --   --  <0.10*  CREATININE 1.89*  --  1.66* 1.55*  --  1.41*  --    Estimated Creatinine Clearance: 54.3 mL/min (by C-G formula based on SCr of 1.41 mg/dL (H)).  Medications:  Infusions:  . sodium chloride 10 mL/hr at 09/13/16 0700  . dexmedetomidine 0.4 mcg/kg/hr (09/14/16 1315)  . dextrose 5 % and 0.45 % NaCl with KCl 40 mEq/L 75 mL/hr at 09/14/16 1300  . heparin 1,300 Units/hr (09/14/16 1300)   Assessment: 71 yo male with post-op afib w RVR, s/p repair of ruptured AAA. CBC stable, HgB low 9.2. No bleeding or infusion related issues reported, will increase rate and avoid bolus given bleeding risk.  Heparin level subtherapeutic: <0.10  Goal of Therapy:  Heparin level 0.3-0.7 units/ml Monitor platelets by anticoagulation protocol: Yes   Plan:  Increase heparin gtt to 1550 units/hr (~17.5 units/hr) Daily heparin level, CBC Monitor for s/sx bleeding, transition to PO AC  Ruben Imony Catheryn Slifer, PharmD Clinical Pharmacist Pager: [phone] 09/14/2016 1:46 PM

## 2016-09-14 NOTE — Progress Notes (Signed)
Patient now in AFib rate 110-135. Lopressor 5mg  IV administered at this time without any changes. Cardiology contacted and Cardizem gtt restarted at this time per Dr. Sandy Salaamorotto.

## 2016-09-14 NOTE — Progress Notes (Signed)
  Amiodarone Drug - Drug Interaction Consult Note  Recommendations: Patient is receiving metoprolol q6 hours, monitor heart rate closely.   Amiodarone is metabolized by the cytochrome P450 system and therefore has the potential to cause many drug interactions. Amiodarone has an average plasma half-life of 50 days (range 20 to 100 days).   There is potential for drug interactions to occur several weeks or months after stopping treatment and the onset of drug interactions may be slow after initiating amiodarone.   []  Statins: Increased risk of myopathy. Simvastatin- restrict dose to 20mg  daily. Other statins: counsel patients to report any muscle pain or weakness immediately.  []  Anticoagulants: Amiodarone can increase anticoagulant effect. Consider warfarin dose reduction. Patients should be monitored closely and the dose of anticoagulant altered accordingly, remembering that amiodarone levels take several weeks to stabilize.  []  Antiepileptics: Amiodarone can increase plasma concentration of phenytoin, the dose should be reduced. Note that small changes in phenytoin dose can result in large changes in levels. Monitor patient and counsel on signs of toxicity.  [x]  Beta blockers: increased risk of bradycardia, AV block and myocardial depression. Sotalol - avoid concomitant use.  []   Calcium channel blockers (diltiazem and verapamil): increased risk of bradycardia, AV block and myocardial depression.  []   Cyclosporine: Amiodarone increases levels of cyclosporine. Reduced dose of cyclosporine is recommended.  []  Digoxin dose should be halved when amiodarone is started.  []  Diuretics: increased risk of cardiotoxicity if hypokalemia occurs.  []  Oral hypoglycemic agents (glyburide, glipizide, glimepiride): increased risk of hypoglycemia. Patient's glucose levels should be monitored closely when initiating amiodarone therapy.   []  Drugs that prolong the QT interval:  Torsades de pointes risk may  be increased with concurrent use - avoid if possible.  Monitor QTc, also keep magnesium/potassium WNL if concurrent therapy can't be avoided. Marland Kitchen. Antibiotics: e.g. fluoroquinolones, erythromycin. . Antiarrhythmics: e.g. quinidine, procainamide, disopyramide, sotalol. . Antipsychotics: e.g. phenothiazines, haloperidol.  . Lithium, tricyclic antidepressants, and methadone. Thank Brien MatesYou,  Babs Dabbs L Leni Pankonin  09/14/2016 10:52 AM

## 2016-09-15 ENCOUNTER — Inpatient Hospital Stay (HOSPITAL_COMMUNITY): Payer: Medicare Other

## 2016-09-15 DIAGNOSIS — R1311 Dysphagia, oral phase: Secondary | ICD-10-CM

## 2016-09-15 LAB — BASIC METABOLIC PANEL
Anion gap: 4 — ABNORMAL LOW (ref 5–15)
BUN: 37 mg/dL — AB (ref 6–20)
CALCIUM: 8.1 mg/dL — AB (ref 8.9–10.3)
CO2: 27 mmol/L (ref 22–32)
CREATININE: 1.34 mg/dL — AB (ref 0.61–1.24)
Chloride: 118 mmol/L — ABNORMAL HIGH (ref 101–111)
GFR calc Af Amer: 60 mL/min — ABNORMAL LOW (ref >60)
GFR, EST NON AFRICAN AMERICAN: 52 mL/min — AB (ref >60)
GLUCOSE: 136 mg/dL — AB (ref 65–99)
POTASSIUM: 3.6 mmol/L (ref 3.5–5.1)
SODIUM: 149 mmol/L — AB (ref 135–145)

## 2016-09-15 LAB — CBC WITH DIFFERENTIAL/PLATELET
BASOS PCT: 0 %
Basophils Absolute: 0 10*3/uL (ref 0.0–0.1)
EOS PCT: 3 %
Eosinophils Absolute: 0.5 10*3/uL (ref 0.0–0.7)
HEMATOCRIT: 30.8 % — AB (ref 39.0–52.0)
HEMOGLOBIN: 9.4 g/dL — AB (ref 13.0–17.0)
Lymphocytes Relative: 9 %
Lymphs Abs: 1.6 10*3/uL (ref 0.7–4.0)
MCH: 30.3 pg (ref 26.0–34.0)
MCHC: 30.5 g/dL (ref 30.0–36.0)
MCV: 99.4 fL (ref 78.0–100.0)
MONOS PCT: 8 %
Monocytes Absolute: 1.4 10*3/uL — ABNORMAL HIGH (ref 0.1–1.0)
NEUTROS PCT: 80 %
Neutro Abs: 14 10*3/uL — ABNORMAL HIGH (ref 1.7–7.7)
Platelets: 384 10*3/uL (ref 150–400)
RBC: 3.1 MIL/uL — AB (ref 4.22–5.81)
RDW: 18.5 % — ABNORMAL HIGH (ref 11.5–15.5)
WBC: 17.5 10*3/uL — AB (ref 4.0–10.5)

## 2016-09-15 LAB — GLUCOSE, CAPILLARY
GLUCOSE-CAPILLARY: 116 mg/dL — AB (ref 65–99)
GLUCOSE-CAPILLARY: 129 mg/dL — AB (ref 65–99)
GLUCOSE-CAPILLARY: 115 mg/dL — AB (ref 65–99)
Glucose-Capillary: 118 mg/dL — ABNORMAL HIGH (ref 65–99)
Glucose-Capillary: 137 mg/dL — ABNORMAL HIGH (ref 65–99)
Glucose-Capillary: 95 mg/dL (ref 65–99)

## 2016-09-15 LAB — BLOOD GAS, ARTERIAL
ACID-BASE DEFICIT: 0.1 mmol/L (ref 0.0–2.0)
Bicarbonate: 24 mmol/L (ref 20.0–28.0)
DRAWN BY: 398991
FIO2: 100
MECHVT: 640 mL
O2 Saturation: 98.2 %
PEEP/CPAP: 5 cmH2O
PO2 ART: 119 mmHg — AB (ref 83.0–108.0)
Patient temperature: 98.6
RATE: 22 resp/min
pCO2 arterial: 38.8 mmHg (ref 32.0–48.0)
pH, Arterial: 7.408 (ref 7.350–7.450)

## 2016-09-15 LAB — HEPARIN LEVEL (UNFRACTIONATED)
HEPARIN UNFRACTIONATED: 0.38 [IU]/mL (ref 0.30–0.70)
Heparin Unfractionated: 0.24 IU/mL — ABNORMAL LOW (ref 0.30–0.70)

## 2016-09-15 LAB — TRIGLYCERIDES: TRIGLYCERIDES: 166 mg/dL — AB (ref <150)

## 2016-09-15 LAB — PHOSPHORUS: Phosphorus: 2.4 mg/dL — ABNORMAL LOW (ref 2.5–4.6)

## 2016-09-15 LAB — MAGNESIUM: Magnesium: 2.4 mg/dL (ref 1.7–2.4)

## 2016-09-15 LAB — PROCALCITONIN: PROCALCITONIN: 0.26 ng/mL

## 2016-09-15 MED ORDER — PROPOFOL 1000 MG/100ML IV EMUL
5.0000 ug/kg/min | INTRAVENOUS | Status: DC
  Administered 2016-09-15 (×2): 25 ug/kg/min via INTRAVENOUS
  Administered 2016-09-16 (×2): 35 ug/kg/min via INTRAVENOUS
  Administered 2016-09-16: 25 ug/kg/min via INTRAVENOUS
  Administered 2016-09-17: 35 ug/kg/min via INTRAVENOUS
  Administered 2016-09-17: 30 ug/kg/min via INTRAVENOUS
  Administered 2016-09-17 – 2016-09-18 (×3): 35 ug/kg/min via INTRAVENOUS
  Administered 2016-09-18: 39.931 ug/kg/min via INTRAVENOUS
  Administered 2016-09-18: 25 ug/kg/min via INTRAVENOUS
  Administered 2016-09-18 – 2016-09-19 (×4): 35 ug/kg/min via INTRAVENOUS
  Filled 2016-09-15 (×19): qty 100

## 2016-09-15 MED ORDER — FENTANYL CITRATE (PF) 100 MCG/2ML IJ SOLN
25.0000 ug | INTRAMUSCULAR | Status: DC | PRN
  Administered 2016-09-17: 25 ug via INTRAVENOUS
  Filled 2016-09-15: qty 2

## 2016-09-15 MED ORDER — AMIODARONE HCL IN DEXTROSE 360-4.14 MG/200ML-% IV SOLN
60.0000 mg/h | INTRAVENOUS | Status: AC
  Filled 2016-09-15: qty 200

## 2016-09-15 MED ORDER — HALOPERIDOL LACTATE 5 MG/ML IJ SOLN
2.0000 mg | Freq: Four times a day (QID) | INTRAMUSCULAR | Status: DC | PRN
  Administered 2016-09-15: 2 mg via INTRAVENOUS
  Filled 2016-09-15: qty 1

## 2016-09-15 MED ORDER — MIDAZOLAM HCL 2 MG/2ML IJ SOLN: 2.0000 mg | Freq: Once | INTRAMUSCULAR | Status: AC

## 2016-09-15 MED ORDER — ROCURONIUM BROMIDE 50 MG/5ML IV SOLN
50.0000 mg | Freq: Once | INTRAVENOUS | Status: AC
  Administered 2016-09-15: 50 mg via INTRAVENOUS

## 2016-09-15 MED ORDER — ORAL CARE MOUTH RINSE
15.0000 mL | Freq: Four times a day (QID) | OROMUCOSAL | Status: DC
  Administered 2016-09-15 – 2016-09-24 (×36): 15 mL via OROMUCOSAL

## 2016-09-15 MED ORDER — ETOMIDATE 2 MG/ML IV SOLN
30.0000 mg | Freq: Once | INTRAVENOUS | Status: AC
Start: 2016-09-15 — End: 2016-09-15
  Administered 2016-09-15: 30 mg via INTRAVENOUS

## 2016-09-15 MED ORDER — SODIUM CHLORIDE 0.9 % IV BOLUS (SEPSIS)
500.0000 mL | Freq: Once | INTRAVENOUS | Status: AC
  Administered 2016-09-15: 500 mL via INTRAVENOUS

## 2016-09-15 MED ORDER — CHLORHEXIDINE GLUCONATE 0.12% ORAL RINSE (MEDLINE KIT)
15.0000 mL | Freq: Two times a day (BID) | OROMUCOSAL | Status: DC
  Administered 2016-09-15 – 2016-09-23 (×17): 15 mL via OROMUCOSAL

## 2016-09-15 MED ORDER — MIDAZOLAM HCL 2 MG/2ML IJ SOLN: 1.0000 mg | INTRAMUSCULAR | Status: DC | PRN

## 2016-09-15 MED ORDER — FENTANYL CITRATE (PF) 100 MCG/2ML IJ SOLN
INTRAMUSCULAR | Status: DC
Start: 2016-09-15 — End: 2016-09-15
  Filled 2016-09-15: qty 2

## 2016-09-15 MED ORDER — AMIODARONE HCL IN DEXTROSE 360-4.14 MG/200ML-% IV SOLN
30.0000 mg/h | INTRAVENOUS | Status: DC
  Administered 2016-09-16 – 2016-09-17 (×3): 30 mg/h via INTRAVENOUS
  Filled 2016-09-15 (×3): qty 200

## 2016-09-15 MED ORDER — MIDAZOLAM HCL 2 MG/2ML IJ SOLN
INTRAMUSCULAR | Status: AC
  Administered 2016-09-15: 2 mg
  Filled 2016-09-15: qty 2

## 2016-09-15 NOTE — Progress Notes (Signed)
Physical Therapy Treatment Patient Details Name: Michael Heath MRN: [id number] DOB: 19-Nov-1945 Today's Date: 09/15/2016    History of Present Illness Patient is a 71 yo male admitted 09/10/2016 with Rt flank pain.  Patient with ruptured AAA, now s/p repair.  Post-op retroperitoneal bleed with abd compartment syndrome.  Post-op Afib and acute hypoxic resp failure, intubated 08/02/16.  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  Extubated 09/12/16.    PMH:  CAD, CKD, HTN, HLD    PT Comments    Pt progressing toward goals. Pt continues to require physical assist for all mobility due decreased strength, balance and cognition. Pt and family educated on importance of movement on the R side and encouraged family to work on LAQ with pt while seated in recliner. Pt would continue to benefit from current d/c recommendation to return to mod-I level of function when medically ready. Will continue to follow.    Follow Up Recommendations  CIR;Supervision/Assistance - 24 hour     Equipment Recommendations   (TBD at next venue)    Recommendations for Other Services       Precautions / Restrictions Precautions Precautions: Fall Precaution Comments: right sided weakness Restrictions Weight Bearing Restrictions: No    Mobility  Bed Mobility Overal bed mobility: Needs Assistance Bed Mobility: Supine to Sit     Supine to sit: Max assist;HOB elevated;+2 for physical assistance     General bed mobility comments: verbal cues to move LE to EOB but able to move R LE with minA and increased time; maxA for trunk upright with HOB elevated secondary to pt pushing into extension; pt able to put L UE on bed and assist with push without cuing to do so; mod-maxA when upright to maintain balance due to posterior lean but progressed to minA  Transfers Overall transfer level: Needs assistance Equipment used: None Transfers: Sit to/from [employer] CorporationStand;Stand Pivot Transfers Sit to Stand: Max assist;+2  physical assistance Stand pivot transfers: Max assist;+2 physical assistance       General transfer comment: verbal cues to bend knees in seated and forward trunk lean; once upright cues for straightening LE and for upright posture; use of bed pad to assist with pelvis during stand; pt requires modA to move R LE during stand pivot and requires physical assist for weight shift  Ambulation/Gait             General Gait Details: NT   Stairs            Wheelchair Mobility    Modified Rankin (Stroke Patients Only)       Balance Overall balance assessment: Needs assistance Sitting-balance support: Single extremity supported;Feet supported Sitting balance-Leahy Scale: Poor Sitting balance - Comments: required L UE support to maintain balance but progressed from maxA to minA Postural control: Posterior lean                          Cognition Arousal/Alertness: Awake/alert Behavior During Therapy: Restless Overall Cognitive Status: Impaired/Different from baseline Area of Impairment: Orientation;Attention;Memory;Following commands;Safety/judgement;Awareness;Problem solving Orientation Level: Disoriented to;Place;Time;Situation Current Attention Level: Sustained Memory: Decreased short-term memory Following Commands: Follows one step commands inconsistently;Follows one step commands with increased time Safety/Judgement: Decreased awareness of safety Awareness: Intellectual Problem Solving: Slow processing;Difficulty sequencing;Requires verbal cues;Requires tactile cues;Decreased initiation General Comments: repeated cues in order to follow motor commands; cognition improved when discussing home life (i.e. dog, daily routine)    Exercises General Exercises - Lower Extremity Long Arc Quad:  AROM;Right;10 reps;Seated    General Comments General comments (skin integrity, edema, etc.): bilateral mittens      Pertinent Vitals/Pain Pain Assessment: No/denies pain     Home Living                      Prior Function            PT Goals (current goals can now be found in the care plan section) Acute Rehab PT Goals Patient Stated Goal: Did not state Progress towards PT goals: Progressing toward goals    Frequency    Min 3X/week      PT Plan Current plan remains appropriate    Co-evaluation             End of Session Equipment Utilized During Treatment: Gait belt;Oxygen Activity Tolerance: Patient tolerated treatment well Patient left: in chair;with call bell/phone within reach;with chair alarm set;with family/visitor present;with SCD's reapplied Nurse Communication: Mobility status PT Visit Diagnosis: Unsteadiness on feet (R26.81);Other abnormalities of gait and mobility (R26.89);Muscle weakness (generalized) (M62.81);Hemiplegia and hemiparesis Hemiplegia - Right/Left: Right Hemiplegia - dominant/non-dominant: Non-dominant Hemiplegia - caused by: Cerebral infarction     Time: 1000-1033 PT Time Calculation (min) (ACUTE ONLY): 33 min  Charges:  $Therapeutic Activity: 23-37 mins                    G Codes:       Michael Heath 10-04-16, 1:12 PM   Lane Hacker, SPT Acute Rehab SPT [phone]

## 2016-09-15 NOTE — Progress Notes (Signed)
Pt showing sinus rhythm in the 90s on the monitor at this time.  Herma ArdMOSELEY, Syliva Mee F, RN

## 2016-09-15 NOTE — Care Management Note (Signed)
Case Management Note  Patient Details  Name: Amil AmenDavid T Krenzer MRN: [id number] Date of Birth: [birth year]  Subjective/Objective:    Pt lives with spouse who will be available for 24/7 assistance.  On amiodarone and heparin gtts, IVF.  Extubated and now on HFNC O2 @ 12L/min.  Anticipate d/c to CIR or SNF for rehab.               Expected Discharge Plan:  Skilled Nursing Facility  In-House Referral:  Clinical Social Work  Discharge planning Services  CM Consult  Status of Service:  In process, will continue to follow  Magdalene RiverMayo, Yobani Schertzer T, RN 09/15/2016, 9:27 AM

## 2016-09-15 NOTE — Progress Notes (Signed)
ANTICOAGULATION CONSULT NOTE - Follow Up Consult  Pharmacy Consult for Heparin Indication: atrial fibrillation  No Known Allergies  Patient Measurements: Height: 6\' 1"  (185.4 cm) Weight: 211 lb 10.3 oz (96 kg) IBW/kg (Calculated) : 79.9 Heparin Dosing Weight: 89 kg  Vital Signs: Temp: 99.3 F (37.4 C) (02/26 0755) Temp Source: Oral (02/26 0755) BP: 186/94 (02/26 0900) Pulse Rate: 90 (02/26 0900)  Labs:  Recent Labs  09/13/16 0306  09/14/16 0001 09/14/16 0406 09/14/16 1009  09/14/16 1754 09/15/16 0230 09/15/16 0903 09/15/16 1147  HGB 9.8*  --   --  9.2*  --   --   --  9.4*  --   --   HCT 32.0*  --   --  30.0*  --   --   --  30.8*  --   --   PLT 322  --   --  349  --   --   --  384  --   --   HEPARINUNFRC  --   --   --   --   --   < > <0.10* 0.24*  --  0.38  CREATININE  --   < > 1.55*  --  1.41*  --   --   --  1.34*  --   < > = values in this interval not displayed. Estimated Creatinine Clearance: 61.7 mL/min (by C-G formula based on SCr of 1.34 mg/dL (H)).  Medications:  Infusions:  . sodium chloride 10 mL/hr at 09/13/16 0700  . amiodarone 30 mg/hr (09/15/16 0510)  . dextrose 5 % and 0.45 % NaCl with KCl 40 mEq/L 75 mL (09/15/16 1121)  . heparin 1,700 Units/hr (09/15/16 0700)   Assessment: 71 yo male with post-op afib w RVR, s/p repair of ruptured AAA and also noted with CVA and respiratory failure. .  -heparin level is 0.38 and at goal on 1550 units/hr -CBC stable  Goal of Therapy:  Heparin level 0.3-0.5 units/ml Monitor platelets by anticoagulation protocol: Yes   Plan:  No heparin changes needed Daily heparin level, CBC Will follow anticoagulation plans  Harland Germanndrew Colton Tassin, Pharm D 09/15/2016 12:27 PM

## 2016-09-15 NOTE — Progress Notes (Signed)
SLP Cancellation Note  Patient Details Name: Michael Heath MRN: [id number] DOB: [birth year]   Cancelled treatment:       Reason Eval/Treat Not Completed: Medical issues which prohibited therapy. Patient fluctuating between HFNC and Bipap, Cortrak just placed. Recommend holding repeat MBS until patients respiratory status improves.   Ferdinand LangoLeah Eathel Pajak MA, CCC-SLP [phone]  [name] 09/15/2016, 10:12 AM

## 2016-09-15 NOTE — Progress Notes (Signed)
Notified Dr. Jamison NeighborNestor of increased work of breathing; increased RR40-50; a d increased lethargy.  Dr. Jamison NeighborNestor at bedside to evaluate patient.  Instructed to have RSI and scope at bedside for possible reintubation.  Wife to bedside and updated by MD.

## 2016-09-15 NOTE — Procedures (Signed)
Indication for Procedure: Performed for worsening respiratory status and hypoxia with increasing work of breathing and paradoxical breathing.  Consent:  Verbal consent obtained from wife at bedside.  Medications Administered: 1. Versed 2 mg IV push 2. Etomidate 30 mg IV push 3. Rocuronium 60 mg IV push   Description of Procedure: Patient was laid recumbent. Timeout was performed. Rapid sequence intubation was performed by sedating the patient utilizing Versed and etomidate and then paralyzing with walking rocuronium once he was sedated. Using a video laryngoscope and a #4 [employer] blade I had excellent visualization of the vocal cords. Endotracheal tube 8.0 was inserted between the vocal cords with ease and secured at 26 cm at the lip. Repeat capnographic color change was appreciated as well as symmetric chest wall rise. Bilateral breath sounds were auscultated by nursing staff at bedside.   Complications: None.   Condition: Remains critically ill and now intratracheally admitted in the intensive care unit. Awaiting postintubation stat portable chest x-ray to confirm tube placement as well as postintubation ABG.

## 2016-09-15 NOTE — Progress Notes (Signed)
Progress Note  Patient Name: Michael Heath Date of Encounter: 09/15/2016  Primary Cardiologist: Mendel RyderH. Sisk   Subjective   Stress-induced atrial fibrillation. Status post ruptured abdominal aortic aneurysm. Rehab getting patient from the bed to the chair. Right side is weak.   Inpatient Medications    Scheduled Meds: . ceFEPime (MAXIPIME) IV  1 g Intravenous Q8H  . chlorhexidine  15 mL Mouth Rinse BID  . Chlorhexidine Gluconate Cloth  6 each Topical Daily  . mouth rinse  15 mL Mouth Rinse q12n4p  . metoprolol  5 mg Intravenous Q6H  . pantoprazole (PROTONIX) IV  40 mg Intravenous Q12H  . sodium chloride flush  10-40 mL Intracatheter Q12H  . vancomycin  750 mg Intravenous Q12H   Continuous Infusions: . sodium chloride 10 mL/hr at 09/13/16 0700  . amiodarone 30 mg/hr (09/15/16 0700)  . amiodarone 30 mg/hr (09/15/16 0510)  . dextrose 5 % and 0.45 % NaCl with KCl 40 mEq/L 75 mL/hr at 09/15/16 0800  . heparin 1,700 Units/hr (09/15/16 0700)   PRN Meds: sodium chloride, albuterol, artificial tears, bisacodyl, haloperidol lactate, hydrALAZINE, labetalol, ondansetron, phenol, sodium chloride flush   Vital Signs    Vitals:   09/15/16 0700 09/15/16 0755 09/15/16 0800 09/15/16 0900  BP: 140/87 140/87 (!) 135/110 (!) 186/94  Pulse: 71 95 95 90  Resp: (!) 28 (!) 24 (!) 38 (!) 26  Temp:  99.3 F (37.4 C)    TempSrc:  Oral    SpO2: 100% 97% 97% 94%  Weight:      Height:        Intake/Output Summary (Last 24 hours) at 09/15/16 1001 Last data filed at 09/15/16 0900  Gross per 24 hour  Intake          2664.31 ml  Output             1680 ml  Net           984.31 ml   Filed Weights   09/13/16 0600 09/14/16 0600 09/15/16 0600  Weight: 215 lb 13.3 oz (97.9 kg) 195 lb 5.2 oz (88.6 kg) 211 lb 10.3 oz (96 kg)    Telemetry    Atrial fib  - Personally Reviewed  ECG     Normal sinus rhythm with NSSTTWA - Personally reviewed.  Physical Exam  Obese/ right sided weakness GEN:   extubated, sitting up in the chair Neck: No JVD Cardiac: irreg. Irreg. , no murmurs, rubs, or gallops.  Respiratory: few rhonchi  GI: Soft, nontender, non-distended  MS: mild leg  edema; No deformity. Neuro:   awake and alert. Right sided weakness. Psych:  Seems to be normal   Labs    Chemistry Recent Labs Lab 09/09/16 0501  09/14/16 0001 09/14/16 1009 09/15/16 0903  NA 142  < > 150* 150* 149*  K 2.8*  < > 3.0* 3.5 3.6  CL 103  < > 111 116* 118*  CO2 31  < > 31 29 27   GLUCOSE 130*  < > 136* 148* 136*  BUN 41*  < > 47* 39* 37*  CREATININE 1.88*  < > 1.55* 1.41* 1.34*  CALCIUM 7.4*  < > 8.1* 8.1* 8.1*  PROT 5.4*  --   --   --   --   ALBUMIN 1.4*  --   --   --   --   AST 82*  --   --   --   --   ALT 35  --   --   --   --  ALKPHOS 50  --   --   --   --   BILITOT 4.7*  --   --   --   --   GFRNONAA 34*  < > 43* 49* 52*  GFRAA 40*  < > 50* 56* 60*  ANIONGAP 8  < > 8 5 4*  < > = values in this interval not displayed.   Hematology  Recent Labs Lab 09/13/16 0306 09/14/16 0406 09/15/16 0230  WBC 19.3* 16.7* 17.5*  RBC 3.25* 3.02* 3.10*  HGB 9.8* 9.2* 9.4*  HCT 32.0* 30.0* 30.8*  MCV 98.5 99.3 99.4  MCH 30.2 30.5 30.3  MCHC 30.6 30.7 30.5  RDW 18.3* 18.8* 18.5*  PLT 322 349 384    Cardiac Enzymes  Recent Labs Lab 09/09/16 0501  TROPONINI <0.03   No results for input(s): TROPIPOC in the last 168 hours.   BNPNo results for input(s): BNP, PROBNP in the last 168 hours.   DDimer No results for input(s): DDIMER in the last 168 hours.   Radiology    Dg Chest Port 1 View  Result Date: 09/15/2016 CLINICAL DATA:  71 year old male status post Ruptured abdominal aortic aneurysm. shortness of breath and acute on chronic respiratory failure. Initial encounter. EXAM: PORTABLE CHEST 1 VIEW COMPARISON:  09/14/2016 and earlier. FINDINGS: Portable AP semi upright view at 0532 hours. Increased veiling opacity at both lung bases. Continued obscuration of the left hemidiaphragm.  The focal patchy opacity at the right lung base seen on 09/11/2016 remains regressed. Stable cardiac size and mediastinal contours. Calcified aortic atherosclerosis. Stable left PICC line. No pneumothorax. No acute pulmonary edema. IMPRESSION: 1. Bibasilar pulmonary opacity, favor a combination of pleural effusions and atelectasis/consolidation. 2. No new cardiopulmonary abnormality. Electronically Signed   By: Odessa Fleming M.D.   On: 09/15/2016 07:08   Dg Chest Port 1v Same Day  Result Date: 09/14/2016 CLINICAL DATA:  Respiratory failure. EXAM: PORTABLE CHEST 1 VIEW COMPARISON:  09/13/2016 FINDINGS: Bibasilar opacity, greater on the left, similar to the previous day's study. Mild scarring is noted in the upper lobes, also stable. No new lung abnormalities. Probable small effusions.  No pneumothorax. Cardiac silhouette is mildly enlarged. Left PICC is stable and well positioned. IMPRESSION: 1. No significant change from the previous day's study. 2. Persistent lung base opacities, left greater than right, likely combination of atelectasis and small effusions. Pneumonia is possible. No pulmonary edema. Electronically Signed   By: Amie Portland M.D.   On: 09/14/2016 10:09   Dg Abd Portable 1v  Result Date: 09/15/2016 CLINICAL DATA:  Feeding tube placement. EXAM: PORTABLE ABDOMEN - 1 VIEW COMPARISON:  Radiographs 09/11/2016. FINDINGS: 01919 hours. Two views submitted. There is a feeding tube looped in the stomach with the tip projected towards the pylorus. A small amount of contrast is present within the gastric fundus. The visualized bowel gas pattern is nonobstructive. Bibasilar atelectasis, probable small pleural effusions and aortic stent graft are noted. IMPRESSION: Feeding tube tip in the distal stomach. Electronically Signed   By: Carey Bullocks M.D.   On: 09/15/2016 09:37   Dg Swallowing Func-speech Pathology  Result Date: 09/13/2016 Objective Swallowing Evaluation: Type of Study: MBS-Modified Barium  Swallow Study Patient Details Name: Michael Heath MRN: [id number] Date of Birth: [birth year] Today's Date: 09/13/2016 Time: SLP Start Time (ACUTE ONLY): 1202-SLP Stop Time (ACUTE ONLY): 1230 SLP Time Calculation (min) (ACUTE ONLY): 28 min Past Medical History: No past medical history on file. Past Surgical History: Past Surgical History: Procedure Laterality  Date . ABDOMINAL AORTIC ENDOVASCULAR STENT GRAFT N/A 08/26/2016  Procedure: ABDOMINAL AORTIC ENDOVASCULAR STENT GRAFT;  Surgeon: Nada Libman, MD;  Location: [facility] OR;  Service: Vascular;  Laterality: N/A; . ABDOMINAL EXPOSURE  08/22/2016  Procedure: Abdominal Exploration;  Surgeon: Nada Libman, MD;  Location: [facility] OR;  Service: Vascular;; . APPLICATION OF WOUND VAC  08/22/2016  Procedure: APPLICATION OF WOUND VAC;  Surgeon: Nada Libman, MD;  Location: [facility] OR;  Service: Vascular;; . CATARACT EXTRACTION W/PHACO Right 03/2010 . CATARACT EXTRACTION W/PHACO Left 02/24/2013  Procedure: CATARACT EXTRACTION PHACO AND INTRAOCULAR LENS PLACEMENT (IOC);  Surgeon: Gemma Payor, MD;  Location: [facility] ORS;  Service: Ophthalmology;  Laterality: Left;  CDE: 13.82 . ESOPHAGOGASTRODUODENOSCOPY N/A 08/29/2016  Procedure: ESOPHAGOGASTRODUODENOSCOPY (EGD);  Surgeon: Napoleon Form, MD;  Location: [facility] ENDOSCOPY;  Service: Endoscopy;  Laterality: N/A; . EYE SURGERY   . HEMATOMA EVACUATION  09/04/2016  Procedure: EVACUATION HEMATOMA;  Surgeon: Nada Libman, MD;  Location: [facility] OR;  Service: Vascular;; . LAPAROTOMY N/A 09/03/2016  Procedure: EXPLORATORY LAPAROTOMY;  Surgeon: Nada Libman, MD;  Location: [facility] OR;  Service: Vascular;  Laterality: N/A; HPI: 71 year old with past medical history of hypertension, hyperlipidemia, CKD, stage III. Admitted today with right flank, back pain. A CTA which showed ruptured AAA. He was transferred emergently to [facility] for stent repair. He developed abdominal comparment syndrome in OR from massive RP bleed. Patient with hypoxemia and resp failure after  receiveing significant amounts of blood products. Intubated 2/13- 2/23. No prior swallowing evaluations found in chart, but MD notes "a couple of years history history of dysphagia" which was never brought to the attention of his physician. MRI 2/20 showed Multifocal acute ischemia, including the left frontal watershed region, possibly secondary to an acute hypotensive episode. The other scattered punctate foci of ischemia are more suggestive of a central cardio embolic process. Subjective: Pt arrives for MBS with RN Assessment / Plan / Recommendation CHL IP CLINICAL IMPRESSIONS 09/13/2016 Clinical Impression Patient presents with moderate oral and severe pharyngeal dysphagia with frank aspiration of thin liquids, penetration of honey-thick liquids and pureed solids with suspected aspiration of material mixed with secretions. Oral stage of swallowing noted for lingual pumping, weak lingual manipulation, delayed oral transit and retrograde movement of bolus from base of tongue to the oral cavity (puree). Swallow is delayed to the pyriform sinuses with thin, and after spillover the epiglottis with honey and pureed consistencies. Noted reduced BOT retraction. Radiologist confirmed presence of cervical osteophytes and protrusion of posterior pharyngeal wall in initial image. SLP noted during examination pt with unilateral posterior pharyngeal protrusion which impeded epiglottic deflection and airway closure, leading to severe residue in the valleculae and posterior pharyngeal wall with all consistencies (thin, honey-thick liquids and pureed), unilateral bolus flow, penetration/aspiration during and after the swallow. Recommend ENT consult to rule out osteophytes vs tissue anomaly in the pharynx. Pt is at severe risk for aspiration; recommend NPO with ice chips PRN after oral care. Patient will likely need temporary alternative means of nutrition/hydration. Patient may also benefit from cognitive-linguistic evaluation.  SLP will follow up to determine readiness for PO intake, follow-up instrumental as appropriate.  SLP Visit Diagnosis Dysphagia, oropharyngeal phase (R13.12) Attention and concentration deficit following -- Frontal lobe and executive function deficit following -- Impact on safety and function Severe aspiration risk   CHL IP TREATMENT RECOMMENDATION 09/13/2016 Treatment Recommendations F/U FEES in --- days (Comment);Therapy as outlined in treatment plan below   Prognosis 09/13/2016 Prognosis for Safe Diet Advancement  Good Barriers to Reach Goals Severity of deficits Barriers/Prognosis Comment -- CHL IP DIET RECOMMENDATION 09/13/2016 SLP Diet Recommendations NPO Liquid Administration via -- Medication Administration Via alternative means Compensations -- Postural Changes --   CHL IP OTHER RECOMMENDATIONS 09/13/2016 Recommended Consults Consider ENT evaluation Oral Care Recommendations Oral care QID Other Recommendations --   CHL IP FOLLOW UP RECOMMENDATIONS 09/13/2016 Follow up Recommendations Other (comment)   CHL IP FREQUENCY AND DURATION 09/13/2016 Speech Therapy Frequency (ACUTE ONLY) min 2x/week Treatment Duration 2 weeks      CHL IP ORAL PHASE 09/13/2016 Oral Phase Impaired Oral - Pudding Teaspoon -- Oral - Pudding Cup -- Oral - Honey Teaspoon Lingual pumping;Weak lingual manipulation;Delayed oral transit Oral - Honey Cup -- Oral - Nectar Teaspoon -- Oral - Nectar Cup -- Oral - Nectar Straw -- Oral - Thin Teaspoon Right anterior bolus loss Oral - Thin Cup -- Oral - Thin Straw -- Oral - Puree Weak lingual manipulation;Lingual pumping;Reduced posterior propulsion;Delayed oral transit Oral - Mech Soft -- Oral - Regular -- Oral - Multi-Consistency -- Oral - Pill -- Oral Phase - Comment --  CHL IP PHARYNGEAL PHASE 09/13/2016 Pharyngeal Phase Impaired Pharyngeal- Pudding Teaspoon -- Pharyngeal -- Pharyngeal- Pudding Cup -- Pharyngeal -- Pharyngeal- Honey Teaspoon Delayed swallow initiation-pyriform sinuses;Reduced epiglottic  inversion;Reduced airway/laryngeal closure;Reduced tongue base retraction;Penetration/Apiration after swallow;Pharyngeal residue - valleculae;Pharyngeal residue - pyriform;Pharyngeal residue - posterior pharnyx Pharyngeal Material enters airway, remains ABOVE vocal cords and not ejected out Pharyngeal- Honey Cup -- Pharyngeal -- Pharyngeal- Nectar Teaspoon -- Pharyngeal -- Pharyngeal- Nectar Cup -- Pharyngeal -- Pharyngeal- Nectar Straw -- Pharyngeal -- Pharyngeal- Thin Teaspoon Delayed swallow initiation-pyriform sinuses;Reduced epiglottic inversion;Reduced tongue base retraction;Reduced airway/laryngeal closure;Penetration/Aspiration during swallow;Moderate aspiration;Pharyngeal residue - valleculae;Pharyngeal residue - posterior pharnyx;Pharyngeal residue - pyriform Pharyngeal Material enters airway, passes BELOW cords without attempt by patient to eject out (silent aspiration) Pharyngeal- Thin Cup -- Pharyngeal -- Pharyngeal- Thin Straw -- Pharyngeal -- Pharyngeal- Puree Delayed swallow initiation-pyriform sinuses;Penetration/Apiration after swallow;Reduced tongue base retraction;Reduced airway/laryngeal closure;Pharyngeal residue - valleculae;Pharyngeal residue - pyriform;Pharyngeal residue - posterior pharnyx Pharyngeal Material enters airway, remains ABOVE vocal cords and not ejected out Pharyngeal- Mechanical Soft -- Pharyngeal -- Pharyngeal- Regular -- Pharyngeal -- Pharyngeal- Multi-consistency -- Pharyngeal -- Pharyngeal- Pill -- Pharyngeal -- Pharyngeal Comment --  CHL IP CERVICAL ESOPHAGEAL PHASE 09/13/2016 Cervical Esophageal Phase WFL Pudding Teaspoon -- Pudding Cup -- Honey Teaspoon -- Honey Cup -- Nectar Teaspoon -- Nectar Cup -- Nectar Straw -- Thin Teaspoon -- Thin Cup -- Thin Straw -- Puree -- Mechanical Soft -- Regular -- Multi-consistency -- Pill -- Cervical Esophageal Comment -- Rondel Baton, MS CF-SLP Speech-Language Pathologist [phone] No flowsheet data found. Arlana Lindau 09/13/2016,  1:40 PM               Cardiac Studies   Tele :   Paroxysmal atrial fib   Patient Profile     71 y.o. male with ruptured AAA .     Assessment & Plan    1. PAF :  No on IV AMio and in NSR. Las episode 12 hrs ago. Plan continue IV amio  Following      Signed, Lesleigh Noe, MD  09/15/2016, 10:01 AM

## 2016-09-15 NOTE — Progress Notes (Signed)
ANTICOAGULATION CONSULT NOTE - Follow Up Consult  Pharmacy Consult for heparin Indication: atrial fibrillation  Labs:  Recent Labs  09/13/16 0306 09/13/16 0529 09/14/16 0001 09/14/16 0406 09/14/16 1009 09/14/16 1200 09/14/16 1754 09/15/16 0230  HGB 9.8*  --   --  9.2*  --   --   --  9.4*  HCT 32.0*  --   --  30.0*  --   --   --  30.8*  PLT 322  --   --  349  --   --   --  384  HEPARINUNFRC  --   --   --   --   --  <0.10* <0.10* 0.24*  CREATININE  --  1.66* 1.55*  --  1.41*  --   --   --     Assessment: 71yo male remains subtherapeutic on heparin after rate increase though closer to goal.  Goal of Therapy:  Heparin level 0.3-0.7 units/ml   Plan:  Will increase heparin gtt by 2 units/kg/hr to 1700 units/hr and check level in 6hr.  Vernard GamblesVeronda Zamoria Boss, PharmD, BCPS  09/15/2016,3:48 AM

## 2016-09-15 NOTE — Progress Notes (Signed)
Pt converted back into Afib RVR with rates 110-140s sustained, 5 mg scheduled lopressor given. Cardiology MD on call [place] notified, orders received to restart amio gtt @ 30 mg. Will implement and continue to monitor.  Herma ArdMOSELEY, Seth Higginbotham F, RN

## 2016-09-15 NOTE — Progress Notes (Addendum)
[facility] Attending Note  Presenting HPI:  71 y.o. male with past medical history of hypertension, hyperlipidemia, CKD, stage III. Admitted today with right flank, back pain. A CTA which showed ruptured AAA. He was transferred emergently to [facility] for stent repair. In the OR he received 16 units PRBC, 10 units FFP, 3 units platelets, 1 unit cryo and 1 Cell Saver. He developed abdominal comparment syndrome in OR from massive RP bleed. He is brought back to the ICU with an open abdomen. PCCM consulted for vent, sedation management.  Subjective:  Patient went into atrial fibrillation with rapid ventricular response again overnight. Converted out to normal sinus rhythm. Patient remains intolerant of BiPAP support. Intolerant of Precedex infusion.  Review of Systems:  Unable to obtain given altered mental status with delirium.   Temp:  [97.6 F (36.4 C)-100 F (37.8 C)] 97.6 F (36.4 C) (02/26 0400) Pulse Rate:  [54-122] 71 (02/26 0700) Resp:  [23-37] 28 (02/26 0700) BP: (97-154)/(34-92) 140/87 (02/26 0700) SpO2:  [92 %-100 %] 100 % (02/26 0700) Weight:  [211 lb 10.3 oz (96 kg)] 211 lb 10.3 oz (96 kg) (02/26 0600)   General:  Wife at bedside.No distress. Eyes open. Integument:  Warm & dry. No rash on exposed skin. Abdominal incision clean, dry, and intact. HEENT:  Moist mucus memebranes. No scleral icterus. Nasogastric tube in place. Neurological:  Pupils symmetric. Spontaneously moving extremities. Recognizes his wife but answers no other questions appropriately. Musculoskeletal:  No joint effusion or erythema appreciated. Symmetric muscle bulk. Pulmonary:  Decreased breath sounds bilateral lung bases. Some mild coarse rhonchi in the upper airways. Moderately increased work of breathing on high flow nasal cannula. Cardiovascular:  Regular rate & rhythm. Persistent mild anasarca. Unable to appreciate JVD. Telemetry:  Sinus rhythm. Abdomen:  Soft. Nondistended. Normoactive  bowel sounds.  LINES/TUBES: OETT 8.0 2/13 - 2/23 OGT 2/13 - 2/23 R IJ CORDIS 2/13 - Out? LUE DL PICC 2/20 >>> NGT 2/26 >>> Foley 2/13 >>> PIV  CBC Latest Ref Rng & Units 09/15/2016 09/14/2016 09/13/2016  WBC 4.0 - 10.5 K/uL 17.5(H) 16.7(H) 19.3(H)  Hemoglobin 13.0 - 17.0 g/dL 9.4(L) 9.2(L) 9.8(L)  Hematocrit 39.0 - 52.0 % 30.8(L) 30.0(L) 32.0(L)  Platelets 150 - 400 K/uL 384 349 322    BMP Latest Ref Rng & Units 09/14/2016 09/14/2016 09/13/2016  Glucose 65 - 99 mg/dL 148(H) 136(H) 139(H)  BUN 6 - 20 mg/dL 39(H) 47(H) 51(H)  Creatinine 0.61 - 1.24 mg/dL 1.41(H) 1.55(H) 1.66(H)  BUN/Creat Ratio 10 - 24 - - -  Sodium 135 - 145 mmol/L 150(H) 150(H) 151(H)  Potassium 3.5 - 5.1 mmol/L 3.5 3.0(L) 2.8(L)  Chloride 101 - 111 mmol/L 116(H) 111 107  CO2 22 - 32 mmol/L 29 31 36(H)  Calcium 8.9 - 10.3 mg/dL 8.1(L) 8.1(L) 8.4(L)   Hepatic Function Latest Ref Rng & Units 09/09/2016 09/08/2016 09/04/2016  Total Protein 6.5 - 8.1 g/dL 5.4(L) 5.3(L) 4.9(L)  Albumin 3.5 - 5.0 g/dL 1.4(L) 1.5(L) 2.0(L)  AST 15 - 41 U/L 82(H) 85(H) 29  ALT 17 - 63 U/L 35 32 18  Alk Phosphatase 38 - 126 U/L 50 42 38  Total Bilirubin 0.3 - 1.2 mg/dL 4.7(H) 5.9(H) 3.7(H)  Bilirubin, Direct 0.1 - 0.5 mg/dL 2.6(H) - -    IMAGING/STUDIES: CTA CHEST/ABD/PELVIS 2/13:  Acutely ruptured 7.8 cm infrarenal abdominal aortic aneurysm with a large right retroperitoneal hematoma. Moderate pulmonary emphysema. Small hiatal hernia. Incidental renal cysts. Colonic diverticulosis. Cholelithiasis. TTE 2/15: LV normal  in size with mild concentric hypertrophy. EF 60-65% with normal regional wall motion. Grade 1 diastolic dysfunction without evidence of elevated filling pressure. LA mildly dilated & RA normal in size. RV normal in size and function. No aortic stenosis or regurgitation. Aortic root normal in size. No mitral stenosis or regurgitation. Pulmonic valve poorly visualized but without stenosis. Trivial tricuspid regurgitation. No  pericardial effusion. MRI BRAIN W/O 2/20:  Multifocal acute ischemia, including the left frontal watershed region, possibly secondary to an acute hypotensive episode. The other scattered punctate foci of ischemia are more suggestive of a central cardio embolic process. No acute hemorrhage or mass effect. MRI C-SPINE 2/20:  C5-C6 disc osteophyte complex causing severe spinal canal stenosis and severe left, moderate right foraminal narrowing. Mild narrowing at the right C4 and left C5 neural foramina. VENOUS DUPLEX RUE 2/20:  Findings consistent with acute superficial vein thrombosis involving the right cephalic vein. No evidence of deep vein thrombosis involving the right upper extremity. No evidence of deep vein or superficial thrombosis involving the left upper extremity. BILATERAL CAROTID U/S 2/21:  The vertebral arteries appear patent with antegrade flow. Findings consistent with a 1- 39% stenosis involving the. right internal carotid artery and the left internal carotid artery.  MICROBIOLOGY: MRSA PCR 2/13: Negative Blood Cultures x2 2/15:  Negative  Tracheal Aspirate Culture 2/14: Normal respiratory flora Blood Cultures x2 2/25 >>>  ANTIBIOTICS: Zinacef 2/14 (periop prophylaxis) Zosyn 2/15 - 2/22 Vancomycin 2/15 >>> Cefepime 2/22 >>>  SIGNIFICANT EVENTS: 2/13 - Admit, OR for ruptured AAA. 2/14 - Back to OR for abdomina closure.  2/20 - Found to have superficial venous thrombus right upper extremity 2/21 - A fib w/ RVR started on Diltiazem 2/23 - Extubated to BiPAP 2/25 - Started on Amiodarone drip after bolus for A fib w/ RVR. Febrile>>recultured  ASSESSMENT/PLAN:  71 y.o. male with ruptured abdominal aortic aneurysm endovascular repair. Status post expiratory laparotomy and subsequent closure. Patient continuing to have intermittent arrhythmia and has been started on amiodarone infusion and last 24 hours. Remains tenuous with a high level of oxygen requirement for his respiratory  failure. Intolerant of Precedex infusion for his ongoing delirium.  1. Acute hypoxic respiratory failure: Continuing pulmonary toilet with incentive spirometer. Holding on further diuresis given acute renal failure. Continuing to wean FiO2. High risk of reintubation. 2. Delirium: Intolerant of Precedex. Checking EKG to determine if atypical antipsychotics or option. Minimizing sedatives and benzodiazepines. 3. Atrial fibrillation with rapid ventricular response: The patient has now converted to normal sinus rhythm. Continuing amiodarone infusion and Lopressor IV q6hr. Continuing systemic anticoagulation with heparin drip per pharmacy protocol. Management per cardiology. Continuous monitoring on telemetry. 4. Multifocal CVA: Likely secondary to hypotension. No obvious embolic source on transthoracic echocardiogram. Maintaining normotension. 5. Possible HCAP: Trending Procalcitonin per algorithm. Continuing vancomycin and cefepime. Monitoring white cell count. Awaiting repeat blood cultures from 2/25. 6. Abdominal aortic aneurysm rupture: Management per vascular surgery. 7. Prolonged QTc:  Checking EKG today.  8. Abdominal compartment syndrome: Status post closure. Seemingly resolved. 9. Acute renal failure: Improving. Repeat electrolyte panel this morning. Trending urine output with foley catheter. 10. Hypernatremia/hyperchloremia: Likely secondary to diuresis. Repeat electrolyte panel this morning and continuing to trend daily. 11. Anemia: Secondary to acute blood loss from retroperitoneal hematoma/acute bleeding. Bleeding seems to stop. Hemoglobin stable. Trending cell counts daily with CBC while on systemic anticoagulation. 12. Hypoglycemia: Resolved. Continuing dextrose IV fluid for now while awaiting potential reinitiation of tube feeding. 13. Right upper extremity superficial venous  thrombosis: Keeping blood pressure cuff off same arm. Monitoring closely. 14. Right carotid artery stenosis:  Mild. 15. Dysphagia: Multifactorial and preceding admission. Speech therapy following. Attempting nasogastric feeding tube placement.   Prophylaxis:  Systemic anticoagulation with heparin drip, Protonix IV every 12 hours, & SCDs. Diet: Nothing by mouth. Speech therapy evaluating. Attempting nasogastric feeding tube placement. Code Status: Full code per previous physician discussions. Disposition: Remains in the intensive care unit given delirium, degree of hypoxia, and ongoing arrhythmia. Family Update: Family updated by myself during rounds on 2/26.  I have personally spent a total of 36 minutes of critical care time today caring for the patient, updating wife at bedside, & reviewing the aptient's electronic medical record.  Sonia Baller Ashok Cordia, M.D. [facility] Pager:  [phone] After 3pm or if no response, call [phone] 8:34 AM 09/15/16

## 2016-09-15 NOTE — Progress Notes (Signed)
[facility] Progress Note  Subjective   Denies any pain.   Objective Vitals:   09/15/16 0800 09/15/16 0900  BP: (!) 135/110 (!) 186/94  Pulse: 95 90  Resp: (!) 38 (!) 26  Temp:      Intake/Output Summary (Last 24 hours) at 09/15/16 1128 Last data filed at 09/15/16 0900  Gross per 24 hour  Intake          2412.83 ml  Output             1530 ml  Net           882.83 ml   Oriented to person and place. Non labored respiratory effort. Abdomen soft without distension. Midline incision clean Bilateral groin sheath sites clean and dry. 2+ DP pulses bilaterally. Moving right hand and able to lift very slightly. Moving right toes.  Assessment/Planning: 71 y.o. male is s/p: EVAR for ruptured AAA, decompressive exploratory laparotomy for abd compartment syndrome 12 Days Post-Op   ARF: Creatinine continuing to improve. UOP okay.  Leukcytosis: sligly increased from yesterday. Currently on vancomycin and cefepime. Blood cultures pending.  CVA: Moving right hand a little bit PAF: Currently in NSR. Dysphagia:Cortrak feeding tube placed. MBS delayed until respiratory status improves.   Appreciate CCM and cardiology assistance with this patient.   Raymond GurneyKimberly A Trinh 09/15/2016 11:28 AM --  Laboratory CBC    Component Value Date/Time   WBC 17.5 (H) 09/15/2016 0230   HGB 9.4 (L) 09/15/2016 0230   HCT 30.8 (L) 09/15/2016 0230   HCT 47.6 05/01/2016 1001   PLT 384 09/15/2016 0230   PLT 230 05/01/2016 1001    BMET    Component Value Date/Time   NA 149 (H) 09/15/2016 0903   NA 134 08/25/2016 1335   K 3.6 09/15/2016 0903   CL 118 (H) 09/15/2016 0903   CO2 27 09/15/2016 0903   GLUCOSE 136 (H) 09/15/2016 0903   BUN 37 (H) 09/15/2016 0903   BUN 26 08/25/2016 1335   CREATININE 1.34 (H) 09/15/2016 0903   CALCIUM 8.1 (L) 09/15/2016 0903   GFRNONAA 52 (L) 09/15/2016 0903   GFRAA 60 (L) 09/15/2016 0903    COAG Lab Results  Component Value Date   INR 1.32  09/04/2016   INR 1.41 08/26/2016   INR 1.22 09/05/2016   No results found for: PTT  Antibiotics Anti-infectives    Start     Dose/Rate Route Frequency Ordered Stop   09/14/16 1400  ceFEPIme (MAXIPIME) 1 g in dextrose 5 % 50 mL IVPB     1 g 100 mL/hr over 30 Minutes Intravenous Every 8 hours 09/14/16 1027     09/14/16 1030  vancomycin (VANCOCIN) IVPB 750 mg/150 ml premix     750 mg 150 mL/hr over 60 Minutes Intravenous Every 12 hours 09/14/16 1027     09/11/16 1200  ceFEPIme (MAXIPIME) 2 g in dextrose 5 % 50 mL IVPB  Status:  Discontinued     2 g 100 mL/hr over 30 Minutes Intravenous Every 24 hours 09/11/16 1014 09/13/16 0911   09/04/16 1600  vancomycin (VANCOCIN) IVPB 750 mg/150 ml premix  Status:  Discontinued     750 mg 150 mL/hr over 60 Minutes Intravenous Every 12 hours 09/04/16 1451 09/09/16 0850   09/04/16 1500  piperacillin-tazobactam (ZOSYN) IVPB 3.375 g  Status:  Discontinued     3.375 g 12.5 mL/hr over 240 Minutes Intravenous Every 8 hours 09/04/16 1451 09/11/16 0908   09/08/2016 1900  cefUROXime (  ZINACEF) 1.5 g in dextrose 5 % 50 mL IVPB  Status:  Discontinued     1.5 g 100 mL/hr over 30 Minutes Intravenous  Once 09/11/2016 1849 09/11/16 0908   08/29/2016 2015  cefUROXime (ZINACEF) 1.5 g in dextrose 5 % 50 mL IVPB     1.5 g 100 mL/hr over 30 Minutes Intravenous Every 12 hours 09/09/2016 2006 09/08/2016 1013       [name], PA-C [facility] Office: [phone] Pager: [phone] 09/15/2016 11:28 AM

## 2016-09-16 ENCOUNTER — Encounter (HOSPITAL_COMMUNITY): Payer: Self-pay | Admitting: *Deleted

## 2016-09-16 ENCOUNTER — Inpatient Hospital Stay (HOSPITAL_COMMUNITY): Payer: Medicare Other

## 2016-09-16 DIAGNOSIS — J189 Pneumonia, unspecified organism: Secondary | ICD-10-CM

## 2016-09-16 DIAGNOSIS — A0472 Enterocolitis due to Clostridium difficile, not specified as recurrent: Secondary | ICD-10-CM

## 2016-09-16 HISTORY — DX: Enterocolitis due to Clostridium difficile, not specified as recurrent: A04.72

## 2016-09-16 LAB — CBC WITH DIFFERENTIAL/PLATELET
BASOS PCT: 0 %
Basophils Absolute: 0 10*3/uL (ref 0.0–0.1)
EOS ABS: 0.6 10*3/uL (ref 0.0–0.7)
EOS PCT: 3 %
HCT: 27.7 % — ABNORMAL LOW (ref 39.0–52.0)
HEMOGLOBIN: 8.5 g/dL — AB (ref 13.0–17.0)
Lymphocytes Relative: 11 %
Lymphs Abs: 2.3 10*3/uL (ref 0.7–4.0)
MCH: 30.6 pg (ref 26.0–34.0)
MCHC: 30.7 g/dL (ref 30.0–36.0)
MCV: 99.6 fL (ref 78.0–100.0)
MONO ABS: 1.2 10*3/uL — AB (ref 0.1–1.0)
Monocytes Relative: 6 %
NEUTROS ABS: 16.5 10*3/uL — AB (ref 1.7–7.7)
Neutrophils Relative %: 80 %
PLATELETS: 329 10*3/uL (ref 150–400)
RBC: 2.78 MIL/uL — ABNORMAL LOW (ref 4.22–5.81)
RDW: 18.6 % — ABNORMAL HIGH (ref 11.5–15.5)
WBC: 20.6 10*3/uL — ABNORMAL HIGH (ref 4.0–10.5)

## 2016-09-16 LAB — GLUCOSE, CAPILLARY
GLUCOSE-CAPILLARY: 107 mg/dL — AB (ref 65–99)
GLUCOSE-CAPILLARY: 103 mg/dL — AB (ref 65–99)
GLUCOSE-CAPILLARY: 111 mg/dL — AB (ref 65–99)
GLUCOSE-CAPILLARY: 119 mg/dL — AB (ref 65–99)
Glucose-Capillary: 105 mg/dL — ABNORMAL HIGH (ref 65–99)

## 2016-09-16 LAB — C DIFFICILE QUICK SCREEN W PCR REFLEX
C DIFFICLE (CDIFF) ANTIGEN: POSITIVE — AB
C Diff interpretation: DETECTED
C Diff toxin: POSITIVE — AB

## 2016-09-16 LAB — RENAL FUNCTION PANEL
ANION GAP: 6 (ref 5–15)
Albumin: 1.8 g/dL — ABNORMAL LOW (ref 3.5–5.0)
BUN: 44 mg/dL — AB (ref 6–20)
CHLORIDE: 119 mmol/L — AB (ref 101–111)
CO2: 23 mmol/L (ref 22–32)
Calcium: 7.6 mg/dL — ABNORMAL LOW (ref 8.9–10.3)
Creatinine, Ser: 1.48 mg/dL — ABNORMAL HIGH (ref 0.61–1.24)
GFR calc Af Amer: 53 mL/min — ABNORMAL LOW (ref >60)
GFR, EST NON AFRICAN AMERICAN: 46 mL/min — AB (ref >60)
Glucose, Bld: 164 mg/dL — ABNORMAL HIGH (ref 65–99)
PHOSPHORUS: 2.7 mg/dL (ref 2.5–4.6)
POTASSIUM: 3.2 mmol/L — AB (ref 3.5–5.1)
Sodium: 148 mmol/L — ABNORMAL HIGH (ref 135–145)

## 2016-09-16 LAB — HEPARIN LEVEL (UNFRACTIONATED): HEPARIN UNFRACTIONATED: 0.5 [IU]/mL (ref 0.30–0.70)

## 2016-09-16 LAB — MAGNESIUM: Magnesium: 2.3 mg/dL (ref 1.7–2.4)

## 2016-09-16 LAB — PROCALCITONIN: Procalcitonin: 0.32 ng/mL

## 2016-09-16 MED ORDER — METRONIDAZOLE 50 MG/ML ORAL SUSPENSION
500.0000 mg | Freq: Three times a day (TID) | ORAL | Status: DC
  Administered 2016-09-16 – 2016-09-17 (×3): 500 mg
  Filled 2016-09-16 (×4): qty 10

## 2016-09-16 MED ORDER — VITAL 1.5 CAL PO LIQD
1000.0000 mL | ORAL | Status: DC
  Administered 2016-09-16 – 2016-09-22 (×6): 1000 mL
  Filled 2016-09-16 (×11): qty 1000

## 2016-09-16 MED ORDER — PRO-STAT SUGAR FREE PO LIQD
60.0000 mL | Freq: Two times a day (BID) | ORAL | Status: DC
  Administered 2016-09-16 – 2016-09-23 (×15): 60 mL
  Filled 2016-09-16 (×15): qty 60

## 2016-09-16 MED ORDER — SODIUM CHLORIDE 0.9 % IV SOLN
30.0000 meq | Freq: Once | INTRAVENOUS | Status: AC
  Administered 2016-09-16: 30 meq via INTRAVENOUS
  Filled 2016-09-16: qty 15

## 2016-09-16 MED ORDER — POTASSIUM CHLORIDE 20 MEQ/15ML (10%) PO SOLN
40.0000 meq | Freq: Once | ORAL | Status: AC
  Administered 2016-09-16: 40 meq
  Filled 2016-09-16: qty 30

## 2016-09-16 MED ORDER — SODIUM CHLORIDE 0.9 % IV BOLUS (SEPSIS)
1000.0000 mL | Freq: Once | INTRAVENOUS | Status: AC
  Administered 2016-09-16: 1000 mL via INTRAVENOUS

## 2016-09-16 MED ORDER — PANTOPRAZOLE SODIUM 40 MG PO PACK
40.0000 mg | PACK | Freq: Two times a day (BID) | ORAL | Status: DC
  Administered 2016-09-16 – 2016-09-17 (×3): 40 mg
  Filled 2016-09-16 (×3): qty 20

## 2016-09-16 NOTE — Progress Notes (Signed)
CRITICAL VALUE ALERT  Critical value received:  Cdiff. positive  Date of notification:  09/16/16  Time of notification:  1337  Critical value read back:yes  Nurse who received alert:  Aline AugustNicole Ieesha Abbasi  MD notified (1st page):  Dr. Celene SkeenNester  Time of first page:    MD notified (2nd page):  Time of second page:  Responding MD:  Dr. Celene SkeenNester  Time MD responded:  [phone]

## 2016-09-16 NOTE — Progress Notes (Signed)
   09/16/16 0955  Clinical Encounter Type  Visited With Patient and family together  Visit Type Initial  Referral From Nurse  Consult/Referral To Chaplain  Recommendations (follow up)  Spiritual Encounters  Spiritual Needs Emotional  Stress Factors  Patient Stress Factors Health changes;Major life changes  Family Stress Factors Exhausted;Major life changes  Pt is resting, spouse is not present at the moment, conferred with nurse to check back later when spouse returns.  Pt received some unpleasant news.  Chaplain will follow up as needed.  Chaplain Shishir Krantz A. Shaena Parkerson , MA-PC , BA-REL/PHIL , [phone]

## 2016-09-16 NOTE — Progress Notes (Signed)
Progress Note  Patient Name: Michael Heath Date of Encounter: 09/16/2016  Primary Cardiologist: Linard Millers   Subjective   Stress-induced atrial fibrillation. Status post ruptured abdominal aortic aneurysm. Reintubated overnight.   Inpatient Medications    Scheduled Meds: . ceFEPime (MAXIPIME) IV  1 g Intravenous Q8H  . chlorhexidine gluconate (MEDLINE KIT)  15 mL Mouth Rinse BID  . Chlorhexidine Gluconate Cloth  6 each Topical Daily  . feeding supplement (PRO-STAT SUGAR FREE 64)  60 mL Per Tube BID  . mouth rinse  15 mL Mouth Rinse QID  . metoprolol  5 mg Intravenous Q6H  . metroNIDAZOLE  500 mg Per Tube Q8H  . pantoprazole sodium  40 mg Per Tube BID  . sodium chloride flush  10-40 mL Intracatheter Q12H  . vancomycin  750 mg Intravenous Q12H   Continuous Infusions: . sodium chloride 10 mL/hr at 09/13/16 0700  . amiodarone 30 mg/hr (09/16/16 1318)  . dextrose 5 % and 0.45 % NaCl with KCl 40 mEq/L 10 mL (09/16/16 1149)  . feeding supplement (VITAL 1.5 CAL) 1,000 mL (09/16/16 1321)  . heparin 1,700 Units/hr (09/16/16 1341)  . propofol (DIPRIVAN) infusion 35 mcg/kg/min (09/16/16 0800)   PRN Meds: sodium chloride, albuterol, artificial tears, bisacodyl, fentaNYL (SUBLIMAZE) injection, haloperidol lactate, hydrALAZINE, labetalol, midazolam, ondansetron, phenol, sodium chloride flush   Vital Signs    Vitals:   09/16/16 1500 09/16/16 1533 09/16/16 1600 09/16/16 1641  BP: 119/67  110/65   Pulse: 81  80   Resp: (!) 28  (!) 28   Temp:    98.3 F (36.8 C)  TempSrc:    Axillary  SpO2: 97% 98% 96%   Weight:      Height:        Intake/Output Summary (Last 24 hours) at 09/16/16 1656 Last data filed at 09/16/16 1600  Gross per 24 hour  Intake          3995.32 ml  Output              895 ml  Net          3100.32 ml   Filed Weights   09/14/16 0600 09/15/16 0600 09/16/16 0400  Weight: 195 lb 5.2 oz (88.6 kg) 211 lb 10.3 oz (96 kg) 220 lb 10.9 oz (100.1 kg)    Telemetry      Atrial fib  - Personally Reviewed  ECG     Normal sinus rhythm with NSSTTWA - Personally reviewed.  Physical Exam   GEN:  Re-intubated. Neck: No JVD Cardiac: irreg. Irreg. , no murmurs, rubs, or gallops.  Respiratory: few rhonchi  GI: Soft, nontender, non-distended  MS: mild leg  edema; No deformity. Neuro:   awake and alert. Right sided weakness. Psych:  Seems to be normal   Labs    Chemistry  Recent Labs Lab 09/14/16 1009 09/15/16 0903 09/16/16 0421  NA 150* 149* 148*  K 3.5 3.6 3.2*  CL 116* 118* 119*  CO2 29 27 23   GLUCOSE 148* 136* 164*  BUN 39* 37* 44*  CREATININE 1.41* 1.34* 1.48*  CALCIUM 8.1* 8.1* 7.6*  ALBUMIN  --   --  1.8*  GFRNONAA 49* 52* 46*  GFRAA 56* 60* 53*  ANIONGAP 5 4* 6     Hematology  Recent Labs Lab 09/14/16 0406 09/15/16 0230 09/16/16 0421  WBC 16.7* 17.5* 20.6*  RBC 3.02* 3.10* 2.78*  HGB 9.2* 9.4* 8.5*  HCT 30.0* 30.8* 27.7*  MCV 99.3 99.4 99.6  MCH 30.5 30.3 30.6  MCHC 30.7 30.5 30.7  RDW 18.8* 18.5* 18.6*  PLT 349 384 329    Cardiac Enzymes No results for input(s): TROPONINI in the last 168 hours. No results for input(s): TROPIPOC in the last 168 hours.   BNPNo results for input(s): BNP, PROBNP in the last 168 hours.   DDimer No results for input(s): DDIMER in the last 168 hours.   Radiology    Dg Chest Port 1 View  Result Date: 09/16/2016 CLINICAL DATA:  New onset of fever, intubated patient status post exploratory laparotomy on September 03, 2016 and placement of an abdominal aortic stent graft on September 02, 2016 for ruptured aortic aneurysm. EXAM: PORTABLE CHEST 1 VIEW COMPARISON:  Portable chest x-ray of September 15, 2016 FINDINGS: The lungs are reasonably well inflated. There are persistent increased densities in the retrocardiac region on the left and at the right lung base. These findings are not greatly changed from yesterday's study. There is no large pleural effusion and no pneumothorax. The heart is  normal in size. The pulmonary vascularity is not engorged. There is calcification in the wall of the aortic arch. The endotracheal tube tip lies 5.6 cm above the carina. The left-sided PICC line tip projects over the distal third of the SVC. The feeding tube tip projects below the inferior margin of the image. IMPRESSION: Persistent bibasilar atelectasis or pneumonia. No CHF nor large pleural effusion. The support tubes are in stable position. Thoracic aortic atherosclerosis. Electronically Signed   By: Haralambos  Martin[place] M.D.   On: 09/16/2016 08:13   Portable Chest Xray  Result Date: 09/15/2016 CLINICAL DATA:  Patient intubated 1 hour ago. EXAM: PORTABLE CHEST 1 VIEW COMPARISON:  09/15/2016 FINDINGS: Endotracheal tube with the tip 3.4 cm above the carina. Feeding tube coursing below the diaphragm with the tip not visualized. Left-sided PICC line with the tip projecting over the cavoatrial junction. Bilateral small pleural effusions. Bilateral airspace disease likely reflecting atelectasis. No pneumothorax. Stable cardiomediastinal silhouette. No acute osseous abnormality. Degenerative changes of bilateral acromioclavicular joints, right worse than left. IMPRESSION: 1. Endotracheal tube with the tip 3.4 cm above the carina. 2. Stable small bilateral pleural effusions and bibasilar atelectasis. Electronically Signed   By: Kathreen Devoid   On: 09/15/2016 14:23   Dg Chest Port 1 View  Result Date: 09/15/2016 CLINICAL DATA:  71 year old male status post Ruptured abdominal aortic aneurysm. shortness of breath and acute on chronic respiratory failure. Initial encounter. EXAM: PORTABLE CHEST 1 VIEW COMPARISON:  09/14/2016 and earlier. FINDINGS: Portable AP semi upright view at 0532 hours. Increased veiling opacity at both lung bases. Continued obscuration of the left hemidiaphragm. The focal patchy opacity at the right lung base seen on 09/11/2016 remains regressed. Stable cardiac size and mediastinal contours. Calcified  aortic atherosclerosis. Stable left PICC line. No pneumothorax. No acute pulmonary edema. IMPRESSION: 1. Bibasilar pulmonary opacity, favor a combination of pleural effusions and atelectasis/consolidation. 2. No new cardiopulmonary abnormality. Electronically Signed   By: Genevie Ann M.D.   On: 09/15/2016 07:08   Dg Abd Portable 1v  Result Date: 09/15/2016 CLINICAL DATA:  Feeding tube placement. EXAM: PORTABLE ABDOMEN - 1 VIEW COMPARISON:  Radiographs 08/26/2016. FINDINGS: 01919 hours. Two views submitted. There is a feeding tube looped in the stomach with the tip projected towards the pylorus. A small amount of contrast is present within the gastric fundus. The visualized bowel gas pattern is nonobstructive. Bibasilar atelectasis, probable small pleural effusions and aortic stent graft are noted. IMPRESSION:  Feeding tube tip in the distal stomach. Electronically Signed   By: Richardean Sale M.D.   On: 09/15/2016 09:37    Cardiac Studies   Telemetry :  Sinus rhythm/sinus bradycardia. Personally reviewed on 09/16/16:  Patient Profile     71 y.o. male with ruptured AAA .     Assessment & Plan    1. Acute illness related paroxysmal atrial fibrillation. Currently in sinus rhythm/sinus bradycardia on IV amiodarone. Maintain current level of rhythm support with amiodarone.  2. Respiratory failure requiring reintubation.     Signed, Sinclair Grooms, MD  09/16/2016, 4:56 PM

## 2016-09-16 NOTE — Progress Notes (Signed)
ANTICOAGULATION CONSULT NOTE - Follow Up Consult  Pharmacy Consult for Heparin Indication: atrial fibrillation  No Known Allergies  Patient Measurements: Height: 6\' 1"  (185.4 cm) Weight: 220 lb 10.9 oz (100.1 kg) IBW/kg (Calculated) : 79.9 Heparin Dosing Weight: 89 kg  Vital Signs: Temp: 98.6 F (37 C) (02/27 0817) Temp Source: Axillary (02/27 0817) BP: 118/60 (02/27 0700) Pulse Rate: 76 (02/27 0700)  Labs:  Recent Labs  09/14/16 0406 09/14/16 1009  09/15/16 0230 09/15/16 0903 09/15/16 1147 09/16/16 0421 09/16/16 0500  HGB 9.2*  --   --  9.4*  --   --  8.5*  --   HCT 30.0*  --   --  30.8*  --   --  27.7*  --   PLT 349  --   --  384  --   --  329  --   HEPARINUNFRC  --   --   < > 0.24*  --  0.38  --  0.50  CREATININE  --  1.41*  --   --  1.34*  --  1.48*  --   < > = values in this interval not displayed. Estimated Creatinine Clearance: 57 mL/min (by C-G formula based on SCr of 1.48 mg/dL (H)).  Medications:  Infusions:  . sodium chloride 10 mL/hr at 09/13/16 0700  . amiodarone 30 mg/hr (09/16/16 0700)  . dextrose 5 % and 0.45 % NaCl with KCl 40 mEq/L 75 mL/hr at 09/16/16 0700  . feeding supplement (VITAL 1.5 CAL)    . heparin 1,700 Units/hr (09/16/16 0700)  . propofol (DIPRIVAN) infusion 35 mcg/kg/min (09/16/16 0700)   Assessment: 71 yo male with post-op afib w RVR, s/p repair of ruptured AAA and also noted with CVA and respiratory failure (re-intubated 2/26) -heparin level is 0.5 and at goal on 1550 units/hr -CBC stable  Goal of Therapy:  Heparin level 0.3-0.5 units/ml Monitor platelets by anticoagulation protocol: Yes   Plan:  No heparin changes needed Daily heparin level, CBC  Harland GermanAndrew Zavian Slowey, Pharm D 09/16/2016 11:04 AM

## 2016-09-16 NOTE — Plan of Care (Signed)
  Interdisciplinary Goals of Care Family Meeting   Date carried out:: 09/16/2016  Location of the meeting: Conference room  Member's involved: Physician, Bedside Registered Nurse, Family Member or next of kin and Other: Grandson, Daughter-in-Law & Family Friend.  Durable Power of Insurance risk surveyorAttorney or acting medical decision maker: Wife & Son  / Michael Heath & Michael Heath  Discussion: We discussed goals of care for Michael Heath.  I had a lengthy discussion with the patient's family regarding his multiple medical problems beginning with his bleeding abdominal aortic aneurysm resulting in compartment syndrome followed by volume overload, pneumonia, acute watershed stroke with worsening speech & dysphagia as well as weakness, persistent respiratory failure requiring reintubation, and atrial fibrillation with rapid ventricular response. We discussed his living will as well as his prognosis for recovery. We also discussed the high probability of further complications. His living will specifically addresses resuscitation in the event of cardiac arrest but family feel he would want every opportunity for recovery, despite the lengthy recovery over months to possibly as a long as a year depending on further complications & set backs. They are going to discuss his wishes further and provide us with a decision regarding placement of a tracheostomy as well as code status. I offered hospital chaplain for spiritual support but they declined.   Code status: Full Code  Disposition: Continue current acute care  Time spent for the meeting: 31 minutes  Lawanda CousinsJennings Rusell Meneely 09/16/2016, 5:17 PM

## 2016-09-16 NOTE — Progress Notes (Signed)
SLP Cancellation Note  Patient Details Name: Amil AmenDavid T Bloxham MRN: [id number] DOB: April 05, 1946   Cancelled treatment:       Reason Eval/Treat Not Completed: Medical issues which prohibited therapy - pt reintubated.   Maxcine Hamaiewonsky, Myrissa Chipley 09/16/2016, 9:15 AM  Maxcine HamLaura Paiewonsky, M.A. CCC-SLP [phone]

## 2016-09-16 NOTE — Plan of Care (Signed)
  Interdisciplinary Goals of Care Family Meeting   Date carried out:: 09/16/2016  Location of the meeting: Unit  Member's involved: Physician, Bedside Registered Nurse and Family Member or next of kin  Durable Power of Attorney or acting medical decision maker: Wife /  [name]  Discussion: We discussed goals of care for Michael AmenDavid T Danziger .  Discussed patient's goals of care with wife today after rounds. Wife reports he has a living well but she is unsure what it says. She reports he had said he would not wish to be in a nursing home. She is unsure if he would wish to undergo tracheostomy or remain full CODE STATUS. She is going to discuss this further with his son and also obtain his living will for review. For now the patient will remain full code.  Code status: Full Code  Disposition: Continue current acute care  Time spent for the meeting: 12 minutes   Lawanda CousinsJennings [place] Lesure 09/16/2016, 8:35 AM

## 2016-09-16 NOTE — Progress Notes (Signed)
[facility] Attending Note  Presenting HPI:  71 y.o. male with past medical history of hypertension, hyperlipidemia, CKD, stage III. Admitted today with right flank, back pain. A CTA which showed ruptured AAA. He was transferred emergently to [facility] for stent repair. In the OR he received 16 units PRBC, 10 units FFP, 3 units platelets, 1 unit cryo and 1 Cell Saver. He developed abdominal comparment syndrome in OR from massive RP bleed. He is brought back to the ICU with an open abdomen. PCCM consulted for vent, sedation management.  Subjective:  Patient developed worsening respiratory failure yesterday and required reintubation.  Review of Systems:  Unable to obtain given intubation and sedation.  Temp:  [99 F (37.2 C)-99.7 F (37.6 C)] 99.1 F (37.3 C) (02/27 0400) Pulse Rate:  [52-99] 76 (02/27 0700) Resp:  [21-39] 25 (02/27 0700) BP: (87-192)/(36-116) 118/60 (02/27 0700) SpO2:  [91 %-100 %] 98 % (02/27 0700) FiO2 (%):  [70 %-100 %] 80 % (02/27 0700) Weight:  [220 lb 10.9 oz (100.1 kg)] 220 lb 10.9 oz (100.1 kg) (02/27 0400)   Gen.: Sedated. No distress. Wife at bedside. Integument: Warm and dry. No rash on exposed skin. Cardiovascular: Regular rate and rhythm. Mild anasarca. Unable to appreciate JVD. Pulmonary: Coarse breath sounds bilaterally. Symmetric chest wall rise on ventilator. Abdomen: Soft. Nondistended. Normal bowel sounds. HEENT: Endotracheal tube in place. No scleral icterus. Tachycardia mucous membranes. Neurological: Pupils pinpoint and symmetric. No spontaneous movements. Currently on propofol infusion.  LINES/TUBES: OGT 2/13 - 2/23 R IJ CORDIS 2/13 - Out (? Date) OETT 8.0 2/13 - 2/23; 8.0 2/26 >>> LUE DL PICC 2/20 >>> NGT 2/26 >>> Foley 2/13 >>> PIV  CBC Latest Ref Rng & Units 09/16/2016 09/15/2016 09/14/2016  WBC 4.0 - 10.5 K/uL 20.6(H) 17.5(H) 16.7(H)  Hemoglobin 13.0 - 17.0 g/dL 8.5(L) 9.4(L) 9.2(L)  Hematocrit 39.0 - 52.0 % 27.7(L)  30.8(L) 30.0(L)  Platelets 150 - 400 K/uL 329 384 349    BMP Latest Ref Rng & Units 09/16/2016 09/15/2016 09/14/2016  Glucose 65 - 99 mg/dL 164(H) 136(H) 148(H)  BUN 6 - 20 mg/dL 44(H) 37(H) 39(H)  Creatinine 0.61 - 1.24 mg/dL 1.48(H) 1.34(H) 1.41(H)  BUN/Creat Ratio 10 - 24 - - -  Sodium 135 - 145 mmol/L 148(H) 149(H) 150(H)  Potassium 3.5 - 5.1 mmol/L 3.2(L) 3.6 3.5  Chloride 101 - 111 mmol/L 119(H) 118(H) 116(H)  CO2 22 - 32 mmol/L _0 Calcium 8.9 - 10.3 mg/dL 7.6(L) 8.1(L) 8.1(L)   Hepatic Function Latest Ref Rng & Units 09/16/2016 09/09/2016 09/08/2016  Total Protein 6.5 - 8.1 g/dL - 5.4(L) 5.3(L)  Albumin 3.5 - 5.0 g/dL 1.8(L) 1.4(L) 1.5(L)  AST 15 - 41 U/L - 82(H) 85(H)  ALT 17 - 63 U/L - 35 32  Alk Phosphatase 38 - 126 U/L - 50 42  Total Bilirubin 0.3 - 1.2 mg/dL - 4.7(H) 5.9(H)  Bilirubin, Direct 0.1 - 0.5 mg/dL - 2.6(H) -    IMAGING/STUDIES: CTA CHEST/ABD/PELVIS 2/13:  Acutely ruptured 7.8 cm infrarenal abdominal aortic aneurysm with a large right retroperitoneal hematoma. Moderate pulmonary emphysema. Small hiatal hernia. Incidental renal cysts. Colonic diverticulosis. Cholelithiasis. TTE 2/15: LV normal in size with mild concentric hypertrophy. EF 60-65% with normal regional wall motion. Grade 1 diastolic dysfunction without evidence of elevated filling pressure. LA mildly dilated & RA normal in size. RV normal in size and function. No aortic stenosis or regurgitation. Aortic root normal in size. No mitral stenosis or regurgitation.  Pulmonic valve poorly visualized but without stenosis. Trivial tricuspid regurgitation. No pericardial effusion. MRI BRAIN W/O 2/20:  Multifocal acute ischemia, including the left frontal watershed region, possibly secondary to an acute hypotensive episode. The other scattered punctate foci of ischemia are more suggestive of a central cardio embolic process. No acute hemorrhage or mass effect. MRI C-SPINE 2/20:  C5-C6 disc osteophyte complex  causing severe spinal canal stenosis and severe left, moderate right foraminal narrowing. Mild narrowing at the right C4 and left C5 neural foramina. VENOUS DUPLEX RUE 2/20:  Findings consistent with acute superficial vein thrombosis involving the right cephalic vein. No evidence of deep vein thrombosis involving the right upper extremity. No evidence of deep vein or superficial thrombosis involving the left upper extremity. BILATERAL CAROTID U/S 2/21:  The vertebral arteries appear patent with antegrade flow. Findings consistent with a 1- 39% stenosis involving the. right internal carotid artery and the left internal carotid artery.  MICROBIOLOGY: MRSA PCR 2/13: Negative Blood Cultures x2 2/15:  Negative  Tracheal Aspirate Culture 2/14: Normal respiratory flora Blood Cultures x2 2/25 >>>  ANTIBIOTICS: Zinacef 2/14 (periop prophylaxis) Zosyn 2/15 - 2/22 Vancomycin 2/15 >>> Cefepime 2/22 >>>  SIGNIFICANT EVENTS: 2/13 - Admit, OR for ruptured AAA. 2/14 - Back to OR for abdomina closure.  2/20 - Found to have superficial venous thrombus right upper extremity 2/21 - A fib w/ RVR started on Diltiazem 2/23 - Extubated to BiPAP 2/25 - Started on Amiodarone drip after bolus for A fib w/ RVR. Febrile>>recultured  ASSESSMENT/PLAN:  71 y.o. male with ruptured abdominal aortic aneurysm endovascular repair. Patient now status post exploratory laparotomy and repair. Patient underwent closure previously. Post operative course has been complicated by atrial fibrillation with rapid ventricular response as well as volume overload. Patient was subsequently reintubated yesterday due to worsening respiratory status and impending failure.   1. Acute hypoxic respiratory failure: Status post reintubation. Continuing to wean FiO2 before attempting weaning of PEEP. Ongoing discussion regarding goals of care for possible tracheostomy later this week. 2. Atrial fibrillation with rapid ventricular response:  Currently normal sinus rhythm. Cardiology following. Continuing amiodarone drip and Lopressor IV every 6 hours. Continuing systemic anticoagulation with heparin drip per pharmacy protocol. Continuing to monitor the patient on telemetry. 3. Multifocal CVA: Likely secondary to hypotension. No obvious embolic source. Maintaining normotension. 4. Possible HCAP: Continuing broad-spectrum antibiotics with vancomycin and cefepime. Trending white cell count. Awaiting results of blood cultures. Checking tracheal aspirate culture.  5. Abdominal aortic aneurysm rupture: Status post repair. Management per vascular surgery. 6. Abdominal compartment syndrome: Status post abdominal closure. Seems to have resolved. 7. Anemia: No signs of active bleeding. Likely due to previous blood loss from retroperitoneal hematoma/acute bleeding. Trending cell counts daily with CBC. 8. Hypoglycemia: Resolved. Initiating tube feedings today. Continuing low-dose dextrose infusion. 9. Acute renal failure: Stable. Continuing to monitor urine output with Foley catheter. Trending electrolytes and renal function daily. 10. Prolonged QTc: Still with some prolongation. Monitoring with intermittent EKG. Continuing to monitor with telemetry. 11. Right upper extremity superficial venous thrombosis: Keeping blood pressure cuff off same arm. 12. Carotid artery stenosis on the right: Mild. Continuing anticoagulation. 13. Dysphagia: Multifactorial. Present on admission as well. Plan for repeat speech therapy consult post tracheostomy. 14. Delirium: Multifactorial. Continuing sedation with propofol. receding admission. Speech therapy following. Attempting nasogastric feeding tube placement.   Prophylaxis:  Systemic anticoagulation with heparin drip & SCDs. Changing to Protonix 22m via tube BID. Diet: Nothing by mouth. Consulting dietician for tube feeding  recommendations. Code Status: Full code per previous physician discussions. Disposition:  Remains endotracheally intubated in the intensive care unit. Plan for tracheostomy placement and disposition following successful placement. Family Update: Wife updated by me during rounds this morning.   I have spent a total of 34 minutes of critical care time today caring for the patient and reviewing the patient's electronic medical record.   Sonia Baller Ashok Cordia, M.D. [facility] Pager:  [phone] After 3pm or if no response, call [phone] 7:52 AM 09/16/16

## 2016-09-16 NOTE — Progress Notes (Signed)
eLink Physician-Brief Progress Note Patient Name: Michael Heath DOB: 01-19-1946 MRN: [id number]   Date of Service  09/16/2016  HPI/Events of Note  Multiple issues: 1. Hypoxia - sats decreased into 80's and 2. Hypotension - BP = 84/50 (MAP = 60). Last LVEF = 60% - 65%. Currently on Propofol IV infusion at 35 mcg/kg/min.  eICU Interventions  Will order: 1. Increase PEEP to 10. 2. Bolus with 0.9 NaCl 1 liter IV over 1 hour now. 3. Titrate Propofol IV infusion down as tolerated.      Intervention Category Major Interventions: Hypoxemia - evaluation and management;Hypotension - evaluation and management  Sommer,Steven Dennard Nipugene 09/16/2016, 3:09 AM

## 2016-09-16 NOTE — Progress Notes (Signed)
RT note- ventilator changes per Dr. Jamison NeighborNestor.

## 2016-09-16 NOTE — Progress Notes (Signed)
AAA Progress Note    09/16/2016 7:32 AM 13 Days Post-Op  Subjective:  Re-intubated yesterday.  Waking up and following commands.  Tm 99.7 now 99.1 HR  50's-70's NSR 80's-120's systolic 97% .[id number]  Gtts: Amiodarone Heparin Diprivan  Receiving K+  Vitals:   09/16/16 0600 09/16/16 0700  BP: 123/63 118/60  Pulse: 78 76  Resp: (!) 27 (!) 25  Temp:      Physical Exam: Cardiac:  regular Lungs:  Re-intubated yesterday; CTAB Abdomen:  Soft, NT/ND; occasional BS Incisions:  Healing nicely; drainage from left groin resolved; Extremities:  Easily palpable DP pulses bilaterally GU:  Scrotal swelling improved.  CBC    Component Value Date/Time   WBC 20.6 (H) 09/16/2016 0421   RBC 2.78 (L) 09/16/2016 0421   HGB 8.5 (L) 09/16/2016 0421   HCT 27.7 (L) 09/16/2016 0421   HCT 47.6 05/01/2016 1001   PLT 329 09/16/2016 0421   PLT 230 05/01/2016 1001   MCV 99.6 09/16/2016 0421   MCV 93 05/01/2016 1001   MCH 30.6 09/16/2016 0421   MCHC 30.7 09/16/2016 0421   RDW 18.6 (H) 09/16/2016 0421   RDW 13.0 05/01/2016 1001   LYMPHSABS 2.3 09/16/2016 0421   LYMPHSABS 2.6 05/01/2016 1001   MONOABS 1.2 (H) 09/16/2016 0421   EOSABS 0.6 09/16/2016 0421   EOSABS 0.6 (H) 05/01/2016 1001   BASOSABS 0.0 09/16/2016 0421   BASOSABS 0.1 05/01/2016 1001    BMET    Component Value Date/Time   NA 148 (H) 09/16/2016 0421   NA 134 08/25/2016 1335   K 3.2 (L) 09/16/2016 0421   CL 119 (H) 09/16/2016 0421   CO2 23 09/16/2016 0421   GLUCOSE 164 (H) 09/16/2016 0421   BUN 44 (H) 09/16/2016 0421   BUN 26 08/25/2016 1335   CREATININE 1.48 (H) 09/16/2016 0421   CALCIUM 7.6 (L) 09/16/2016 0421   GFRNONAA 46 (L) 09/16/2016 0421   GFRAA 53 (L) 09/16/2016 0421    INR    Component Value Date/Time   INR 1.32 09/04/2016 1458     Intake/Output Summary (Last 24 hours) at 09/16/16 0732 Last data filed at 09/16/16 0700  Gross per 24 hour  Intake          4389.94 ml  Output              800 ml    Net          3589.94 ml     Assessment/Plan:  71 y.o. male is s/p  Repair ruptured aorta 13 Days Post-Op  -pt re-intubated yesterday - stat PCXR ordered this am; given leukocytosis and low grade fever, may have aspiration PNA.  Wounds are healing nicely and no further drainage from left groin.   Pt on Vanc/Cefepime -creatinine improved with good UOP -no further afib-on amio and heparin -per RN, pt was unable to speak and had difficulty swallowing prior to re-intubation.  Squeezing right hand better than last week.  -appreciate CCM, cards   Michael Massed, PA-C [facility] [phone] 09/16/2016 7:32 AM   I agree with the above.  I have seen and examined the patient.  I had an extensive conversation with the wife and family.  The son who also shares POA is arriving tonight for further discussions.  We discussed end of life issues and proceeding with trach and possibly PEG.  They are struggling with the decision process.  I asked if they would mind if I got Palliative care involoved, and  they were receptive.  I was unsuccessful in contacting them tonight and will call them tomorrow morning.  Michael Heath

## 2016-09-16 NOTE — Progress Notes (Signed)
Nutrition Consult/Follow Up  DOCUMENTATION CODES:   Not applicable  INTERVENTION:    Re-start Vital 1.5 formula at 20 ml/hr and increase by 10 ml every 4 hours to goal rate of 50 ml/hr   Prostat liquid protein 60 ml BID   Total TF regimen to provide 2200 kcals, 141 gm protein, 917 ml of free water  NUTRITION DIAGNOSIS:   Inadequate oral intake related to inability to eat as evidenced by NPO status, ongoing  GOAL:   Patient will meet greater than or equal to 90% of their needs, currently unmet  MONITOR:   TF tolerance, Vent status, Labs, Weight trends, Skin, I & O's  ASSESSMENT:   [name] wiht PMH of HTN, HLD, CKD, stage III. Admitted with right flank, back pain. A CTA which showed ruptured AAA. [facility].    Pt s/p procedures 2/13: REPAIR OF RUPTURED ABDOMINAL AORTIC ANEURYSM EVACUATION OF RETROPERITONEAL HEMATOMA PLACEMENT OF ABDOMINAL WOUND VAC  Patient is currently intubated on ventilator support MV: 17.2 L/min Temp (24hrs), Avg:99.1 F (37.3 C), Min:98.6 F (37 C), Max:99.7 F (37.6 C)  Pt developed abdominal comparment syndrome in OR from massive RP bleed.  CORTRAK feeding tube placed 2/26 (tip in distal stomach). Previously receiving Vital 1.5 formula with liquid protein. Labs and medications reviewed. CBG's [id number].  Diet Order:  Diet NPO time specified  Skin:  Reviewed, no issues  Last BM:  2/24  Height:   Ht Readings from Last 1 Encounters:  09/15/16 6\' 1"  (1.854 m)    Weight:   Wt Readings from Last 1 Encounters:  09/16/16 220 lb 10.9 oz (100.1 kg)  Admit wt         222 lb (100.7 kg)  Ideal Body Weight:  83.6 kg  BMI:  Body mass index is 29.12 kg/m.  Estimated Nutritional Needs:   Kcal:  2338  Protein:  140-150 gm  Fluid:  per MD  EDUCATION NEEDS:   No education needs identified at this time  Maureen ChattersKatie Maxfield Gildersleeve, RD, LDN Pager #: [phone] After-Hours Pager #: [phone]

## 2016-09-16 NOTE — Progress Notes (Signed)
eLink Physician-Brief Progress Note Patient Name: Michael Heath DOB: 1946/06/08 MRN: [id number]   Date of Service  09/16/2016  HPI/Events of Note  K+ = 3.2 and Creatinine = 1.48.  eICU Interventions  Will replace K+.     Intervention Category Major Interventions: Electrolyte abnormality - evaluation and management  Rhina Kramme Eugene 09/16/2016, 6:40 AM

## 2016-09-17 ENCOUNTER — Inpatient Hospital Stay (HOSPITAL_COMMUNITY): Payer: Medicare Other

## 2016-09-17 LAB — GLUCOSE, CAPILLARY
GLUCOSE-CAPILLARY: 123 mg/dL — AB (ref 65–99)
GLUCOSE-CAPILLARY: 124 mg/dL — AB (ref 65–99)
GLUCOSE-CAPILLARY: 129 mg/dL — AB (ref 65–99)
GLUCOSE-CAPILLARY: 133 mg/dL — AB (ref 65–99)
GLUCOSE-CAPILLARY: 136 mg/dL — AB (ref 65–99)
Glucose-Capillary: 122 mg/dL — ABNORMAL HIGH (ref 65–99)
Glucose-Capillary: 123 mg/dL — ABNORMAL HIGH (ref 65–99)

## 2016-09-17 LAB — CBC WITH DIFFERENTIAL/PLATELET
BASOS ABS: 0 10*3/uL (ref 0.0–0.1)
Basophils Relative: 0 %
EOS PCT: 3 %
Eosinophils Absolute: 0.7 10*3/uL (ref 0.0–0.7)
HEMATOCRIT: 28.3 % — AB (ref 39.0–52.0)
HEMOGLOBIN: 8.7 g/dL — AB (ref 13.0–17.0)
LYMPHS ABS: 2.4 10*3/uL (ref 0.7–4.0)
Lymphocytes Relative: 10 %
MCH: 30.5 pg (ref 26.0–34.0)
MCHC: 30.7 g/dL (ref 30.0–36.0)
MCV: 99.3 fL (ref 78.0–100.0)
MONO ABS: 1.2 10*3/uL — AB (ref 0.1–1.0)
MONOS PCT: 5 %
NEUTROS PCT: 82 %
Neutro Abs: 19.2 10*3/uL — ABNORMAL HIGH (ref 1.7–7.7)
Platelets: 342 10*3/uL (ref 150–400)
RBC: 2.85 MIL/uL — AB (ref 4.22–5.81)
RDW: 19.2 % — AB (ref 11.5–15.5)
WBC: 23.5 10*3/uL — AB (ref 4.0–10.5)

## 2016-09-17 LAB — RENAL FUNCTION PANEL
ALBUMIN: 1.8 g/dL — AB (ref 3.5–5.0)
Anion gap: 7 (ref 5–15)
BUN: 40 mg/dL — ABNORMAL HIGH (ref 6–20)
CALCIUM: 7.9 mg/dL — AB (ref 8.9–10.3)
CO2: 22 mmol/L (ref 22–32)
CREATININE: 1.5 mg/dL — AB (ref 0.61–1.24)
Chloride: 117 mmol/L — ABNORMAL HIGH (ref 101–111)
GFR calc Af Amer: 52 mL/min — ABNORMAL LOW (ref >60)
GFR, EST NON AFRICAN AMERICAN: 45 mL/min — AB (ref >60)
GLUCOSE: 161 mg/dL — AB (ref 65–99)
PHOSPHORUS: 2.8 mg/dL (ref 2.5–4.6)
Potassium: 3.5 mmol/L (ref 3.5–5.1)
SODIUM: 146 mmol/L — AB (ref 135–145)

## 2016-09-17 LAB — MAGNESIUM: MAGNESIUM: 2.2 mg/dL (ref 1.7–2.4)

## 2016-09-17 LAB — PROTIME-INR
INR: 1.25
PROTHROMBIN TIME: 15.7 s — AB (ref 11.4–15.2)

## 2016-09-17 LAB — HEPARIN LEVEL (UNFRACTIONATED): HEPARIN UNFRACTIONATED: 0.5 [IU]/mL (ref 0.30–0.70)

## 2016-09-17 LAB — APTT: aPTT: 77 seconds — ABNORMAL HIGH (ref 24–36)

## 2016-09-17 MED ORDER — MIDAZOLAM HCL 2 MG/2ML IJ SOLN
4.0000 mg | Freq: Once | INTRAMUSCULAR | Status: DC
  Filled 2016-09-17: qty 4

## 2016-09-17 MED ORDER — AMIODARONE HCL 200 MG PO TABS
400.0000 mg | ORAL_TABLET | Freq: Every day | ORAL | Status: DC
  Administered 2016-09-17 – 2016-09-22 (×6): 400 mg via NASOGASTRIC
  Filled 2016-09-17 (×6): qty 2

## 2016-09-17 MED ORDER — FENTANYL CITRATE (PF) 100 MCG/2ML IJ SOLN
200.0000 ug | Freq: Once | INTRAMUSCULAR | Status: DC
  Filled 2016-09-17: qty 4

## 2016-09-17 MED ORDER — FAMOTIDINE 40 MG/5ML PO SUSR
20.0000 mg | Freq: Two times a day (BID) | ORAL | Status: DC
  Administered 2016-09-17 – 2016-09-23 (×12): 20 mg
  Filled 2016-09-17 (×12): qty 2.5

## 2016-09-17 MED ORDER — METOPROLOL TARTRATE 12.5 MG HALF TABLET
12.5000 mg | ORAL_TABLET | Freq: Two times a day (BID) | ORAL | Status: DC
  Administered 2016-09-17 – 2016-09-21 (×7): 12.5 mg via NASOGASTRIC
  Filled 2016-09-17 (×8): qty 1

## 2016-09-17 MED ORDER — PROPOFOL 500 MG/50ML IV EMUL
5.0000 ug/kg/min | Freq: Once | INTRAVENOUS | Status: DC
  Filled 2016-09-17: qty 50

## 2016-09-17 MED ORDER — VANCOMYCIN 50 MG/ML ORAL SOLUTION
125.0000 mg | Freq: Four times a day (QID) | ORAL | Status: DC
  Administered 2016-09-17 – 2016-09-23 (×25): 125 mg via ORAL
  Filled 2016-09-17 (×33): qty 2.5

## 2016-09-17 MED ORDER — AMIODARONE HCL 200 MG PO TABS: 200.0000 mg | ORAL_TABLET | Freq: Every day | ORAL | Status: DC

## 2016-09-17 MED ORDER — VECURONIUM BROMIDE 10 MG IV SOLR
10.0000 mg | Freq: Once | INTRAVENOUS | Status: AC
  Administered 2016-09-18: 10 mg via INTRAVENOUS
  Filled 2016-09-17: qty 10

## 2016-09-17 MED ORDER — ETOMIDATE 2 MG/ML IV SOLN
40.0000 mg | Freq: Once | INTRAVENOUS | Status: AC
  Administered 2016-09-18: 20 mg via INTRAVENOUS
  Filled 2016-09-17: qty 20

## 2016-09-17 NOTE — Progress Notes (Signed)
RT note- patient weaned close to a hour, awake, worked with PT, sat on the side of the bed, scheduled for a tracheotomy tomorrow.

## 2016-09-17 NOTE — Progress Notes (Signed)
Physical Therapy Treatment Patient Details Name: Michael AmenDavid T Depoy MRN: [id number] DOB: [birth year] Today's Date: 09/17/2016    History of Present Illness Patient is a 71 yo male admitted 08/21/2016 with Rt flank pain.  Patient with ruptured AAA, now s/p repair.  Post-op retroperitoneal bleed with abd compartment syndrome.  Post-op Afib and acute hypoxic resp failure, intubated 08/02/16.  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  Extubated 09/12/16.    PMH:  CAD, CKD, HTN, HLD    PT Comments    Pt reintubated 2/26, however, was still able to participate with PT and sat EOB with O2 sats >95% throughout session. Pt able to perform LE exercises at EOB with repeated verbal cues and physical assist for trunk control due to decreased balance and physical assist to perform exercise. Recommend d/c to SNF for safety and to decrease caregiver burden secondary to increased physical assist required and decreased activity tolerance. PT will continue to follow.     Follow Up Recommendations  SNF;Supervision/Assistance - 24 hour     Equipment Recommendations  None recommended by PT    Recommendations for Other Services       Precautions / Restrictions Precautions Precautions: Fall Precaution Comments: right sided weakness Restrictions Weight Bearing Restrictions: No    Mobility  Bed Mobility Overal bed mobility: Needs Assistance Bed Mobility: Supine to Sit;Sit to Supine     Supine to sit: Total assist;+2 for physical assistance;+2 for safety/equipment;HOB elevated Sit to supine: Total assist;+2 for physical assistance;+2 for safety/equipment   General bed mobility comments: total assist x2 for bed mobility with third person assist with ventilator/line management; O2 sats remained above 95%  Transfers                 General transfer comment: NT  Ambulation/Gait             General Gait Details: NT   Stairs            Wheelchair Mobility    Modified  Rankin (Stroke Patients Only) Modified Rankin (Stroke Patients Only) Pre-Morbid Rankin Score: No symptoms Modified Rankin: Severe disability     Balance Overall balance assessment: Needs assistance Sitting-balance support: Bilateral upper extremity supported;Feet supported Sitting balance-Leahy Scale: Zero Sitting balance - Comments: requires maxA to maintain seated on EOB x 6 min secondary to posterior trunk lean; pt attempted to use L UE to assist with upright causing R lateral lean which he was unable to counteract with R UE Postural control: Posterior lean;Right lateral lean                          Cognition Arousal/Alertness: Awake/alert Behavior During Therapy: Flat affect Overall Cognitive Status: Impaired/Different from baseline Area of Impairment: Attention;Following commands;Problem solving   Current Attention Level: Sustained   Following Commands: Follows one step commands inconsistently;Follows one step commands with increased time     Problem Solving: Requires verbal cues;Requires tactile cues;Slow processing;Decreased initiation General Comments: repeated verbal and tactile cues to carry out task; slow processing    Exercises General Exercises - Upper Extremity Shoulder Flexion: AAROM;Both;5 reps;Supine General Exercises - Lower Extremity Long Arc Quad: Strengthening;Both;AAROM;5 reps;Seated    General Comments General comments (skin integrity, edema, etc.): bilat wrist restraints, vent/intubated      Pertinent Vitals/Pain Pain Assessment: No/denies pain    Home Living                      Prior  Function            PT Goals (current goals can now be found in the care plan section) Acute Rehab PT Goals Patient Stated Goal: none stated Progress towards PT goals: Not progressing toward goals - comment (due to medical complications (pt reintubated 2/26))    Frequency    Min 3X/week      PT Plan Discharge plan needs to be  updated    Co-evaluation             End of Session Equipment Utilized During Treatment: Other (comment) (ventilator) Activity Tolerance: Patient tolerated treatment well Patient left: in bed;with call bell/phone within reach;with bed alarm set;with restraints reapplied Nurse Communication: Mobility status PT Visit Diagnosis: Other abnormalities of gait and mobility (R26.89);Muscle weakness (generalized) (M62.81);Hemiplegia and hemiparesis Hemiplegia - Right/Left: Right Hemiplegia - dominant/non-dominant: Non-dominant Hemiplegia - caused by: Cerebral infarction     Time: 1610-9604 PT Time Calculation (min) (ACUTE ONLY): 32 min  Charges:  $Therapeutic Exercise: 8-22 mins $Therapeutic Activity: 8-22 mins                    G Codes:       Lane Hacker October 16, 2016, 1:33 PM   Lane Hacker, SPT Acute Rehab SPT [phone]

## 2016-09-17 NOTE — Progress Notes (Signed)
[facility] Attending Note  Presenting HPI:  71 y.o. male with past medical history of hypertension, hyperlipidemia, CKD, stage III. Admitted today with right flank, back pain. A CTA which showed ruptured AAA. He was transferred emergently to [facility] for stent repair. In the OR he received 16 units PRBC, 10 units FFP, 3 units platelets, 1 unit cryo and 1 Cell Saver. He developed abdominal comparment syndrome in OR from massive RP bleed. He is brought back to the ICU with an open abdomen. PCCM consulted for vent, sedation management.  Subjective:  No acute events overnight. Weaned to PEEP 12 overnight. She did have frequent copious watery diarrhea yesterday which resulted as C. difficile positive. Started on Flagyl via the tube.  Review of Systems:  Unable to obtain with intubation & sedation.  Temp:  [98.3 F (36.8 C)-99.1 F (37.3 C)] 98.9 F (37.2 C) (02/28 0754) Pulse Rate:  [62-81] 78 (02/28 0700) Resp:  [10-30] 30 (02/28 0700) BP: (100-133)/(61-74) 118/64 (02/28 0700) SpO2:  [95 %-98 %] 96 % (02/28 0700) FiO2 (%):  [40 %-60 %] 40 % (02/28 0400) Weight:  [232 lb 12.9 oz (105.6 kg)] 232 lb 12.9 oz (105.6 kg) (02/28 0600)   Gen.: Sedated. No family at bedside. No distress. Neurological: Moving all 4 extremities and following commands. Continued weakness in right upper extremity. Tracks to voice and nods to questions. Integument: Warm and dry. No rash or bruising on exposed skin. Cardiovascular: Regular rate and rhythm. Sinus rhythm on telemetry. Mild anasarca persists. On appreciable JVD. Pulmonary: Coarse to clear breath sounds bilaterally. Symmetric chest wall expansion on ventilator. Abdomen: Soft. Protuberant. Normal bowel sounds. HEENT: No scleral icterus. Tacky mucous membranes. Endotracheal tube in place.  LINES/TUBES: OGT 2/13 - 2/23 R IJ CORDIS 2/13 - Out (? Date) OETT 8.0 2/13 - 2/23; 8.0 2/26 >>> LUE DL PICC 2/20 >>> NGT 2/26 >>> Foley 2/13  >>> PIV  CBC Latest Ref Rng & Units 09/17/2016 09/16/2016 09/15/2016  WBC 4.0 - 10.5 K/uL 23.5(H) 20.6(H) 17.5(H)  Hemoglobin 13.0 - 17.0 g/dL 8.7(L) 8.5(L) 9.4(L)  Hematocrit 39.0 - 52.0 % 28.3(L) 27.7(L) 30.8(L)  Platelets 150 - 400 K/uL 342 329 384    BMP Latest Ref Rng & Units 09/17/2016 09/16/2016 09/15/2016  Glucose 65 - 99 mg/dL 161(H) 164(H) 136(H)  BUN 6 - 20 mg/dL 40(H) 44(H) 37(H)  Creatinine 0.61 - 1.24 mg/dL 1.50(H) 1.48(H) 1.34(H)  BUN/Creat Ratio 10 - 24 - - -  Sodium 135 - 145 mmol/L 146(H) 148(H) 149(H)  Potassium 3.5 - 5.1 mmol/L 3.5 3.2(L) 3.6  Chloride 101 - 111 mmol/L 117(H) 119(H) 118(H)  CO2 22 - 32 mmol/L _0 Calcium 8.9 - 10.3 mg/dL 7.9(L) 7.6(L) 8.1(L)   Hepatic Function Latest Ref Rng & Units 09/17/2016 09/16/2016 09/09/2016  Total Protein 6.5 - 8.1 g/dL - - 5.4(L)  Albumin 3.5 - 5.0 g/dL 1.8(L) 1.8(L) 1.4(L)  AST 15 - 41 U/L - - 82(H)  ALT 17 - 63 U/L - - 35  Alk Phosphatase 38 - 126 U/L - - 50  Total Bilirubin 0.3 - 1.2 mg/dL - - 4.7(H)  Bilirubin, Direct 0.1 - 0.5 mg/dL - - 2.6(H)    IMAGING/STUDIES: CTA CHEST/ABD/PELVIS 2/13:  Acutely ruptured 7.8 cm infrarenal abdominal aortic aneurysm with a large right retroperitoneal hematoma. Moderate pulmonary emphysema. Small hiatal hernia. Incidental renal cysts. Colonic diverticulosis. Cholelithiasis. TTE 2/15: LV normal in size with mild concentric hypertrophy. EF 60-65% with normal regional wall motion. Grade  1 diastolic dysfunction without evidence of elevated filling pressure. LA mildly dilated & RA normal in size. RV normal in size and function. No aortic stenosis or regurgitation. Aortic root normal in size. No mitral stenosis or regurgitation. Pulmonic valve poorly visualized but without stenosis. Trivial tricuspid regurgitation. No pericardial effusion. MRI BRAIN W/O 2/20:  Multifocal acute ischemia, including the left frontal watershed region, possibly secondary to an acute hypotensive episode. The other  scattered punctate foci of ischemia are more suggestive of a central cardio embolic process. No acute hemorrhage or mass effect. MRI C-SPINE 2/20:  C5-C6 disc osteophyte complex causing severe spinal canal stenosis and severe left, moderate right foraminal narrowing. Mild narrowing at the right C4 and left C5 neural foramina. VENOUS DUPLEX RUE 2/20:  Findings consistent with acute superficial vein thrombosis involving the right cephalic vein. No evidence of deep vein thrombosis involving the right upper extremity. No evidence of deep vein or superficial thrombosis involving the left upper extremity. BILATERAL CAROTID U/S 2/21:  The vertebral arteries appear patent with antegrade flow. Findings consistent with a 1- 39% stenosis involving the. right internal carotid artery and the left internal carotid artery.  MICROBIOLOGY: MRSA PCR 2/13: Negative Blood Cultures x2 2/15:  Negative  Tracheal Aspirate Culture 2/14: Normal respiratory flora Blood Cultures x2 2/25 >>> Tracheal Aspirate Culture 2/27 >>> Stool C Diff 2/27:  Toxin & Antigen Positive  ANTIBIOTICS: Zinacef 2/14 (periop prophylaxis) Zosyn 2/15 - 2/22 Vancomycin 2/15 - 2/28 Cefepime 2/22 >>> Flagyl via tube 2/27 >>>  SIGNIFICANT EVENTS: 2/13 - Admit, OR for ruptured AAA. 2/14 - Back to OR for abdomina closure.  2/20 - Found to have superficial venous thrombus right upper extremity 2/21 - A fib w/ RVR started on Diltiazem 2/23 - Extubated to BiPAP 2/25 - Started on Amiodarone drip after bolus for A fib w/ RVR. Febrile>>recultured  ASSESSMENT/PLAN:  71 y.o. male with ruptured abdominal aortic aneurysm endovascular repair. Now status post exploratory laparotomy and subsequent closure. Postoperative course complicated by atrial fibrillation with rapid ventricular response, volume overload, sepsis, and now C. difficile colitis.  1. Acute hypoxic respiratory failure: Status post reintubation. Slowly wean PEEP. Plan for tracheostomy  later this week with his approval. 2. Atrial fibrillation with rapid ventricular response: Continuing on amiodarone drip. Cardiology following. He'll likely transitioned to amiodarone via the tube eventually. Continuing systemic anticoagulation with heparin drip per pharmacy protocol. Monitor the patient on telemetry. 3. Multifocal CVA: Likely secondary to hypotension. No obvious embolic source. Maintaining normotension. 4. Sepsis: Cultures pending. Secondary to C. difficile colitis versus HCAP.discontinuing vancomycin today. 5. C. difficile colitis: Continuing on Flagyl via the tube day #2. Continuing enteric precautions. 6. Possible HCAP: Continuing cefepime. Awaiting tracheal aspirate culture. Discontinue vancomycin today. 7. Abdominal aortic aneurysm status post rupture: Status post surgical repair. Management per vascular surgery. 8. Anemia: Hemoglobin stable. No signs of active bleeding. Trending cell counts daily with CBC. 9. Abdominal compartment syndrome: Status post abdominal closure. Resolved. 10. Hypoglycemia: Resolved. Continuing tube feedings. 11. Acute renal failure: Stable. Monitoring urine output with Foley catheter. Trending electrolytes and renal function daily. 12. Prolonged QTc: Monitoring with intermittent EKG. Monitor patient on telemetry. 13. Right upper extremity superficial venous thrombosis: Monitoring with blood pressure cuff off of the same arm. 14. Carotid artery stenosis: Present on the right. Mild. Continuing anticoagulation. 15. Dysphagia: Multifactorial. Present prior to admission as well. Plan for speech therapy consult once extubated. 16. Delirium: Multifactorial. Continuing current sedation.   Acute hypoxic respiratory failure: Status post reintubation.  Continuing to wean FiO2 before attempting weaning of PEEP.   Prophylaxis:  Systemic anticoagulation with heparin drip, SCDs, & switching to Pepcid via tube BID. Diet: Nothing by mouth. Tube feeding  recommendations. Code Status: Full code per previous physician discussions. Disposition: Remains endotracheally intubated in the intensive care unit. Plan for tracheostomy placement and disposition following successful placement. Family Update: No family at bedside this morning.  I have spent a total of 32 minutes of critical care time today caring for the patient and reviewing the patient's electronic medical record.   Sonia Baller Ashok Cordia, M.D. [facility] Pager:  [phone] After 3pm or if no response, call [phone] 8:49 AM 09/17/16

## 2016-09-17 NOTE — Progress Notes (Signed)
RT NOTE:  Peep decreased to 12 per Dr. Jamison NeighborNestor order. Sats 97% (FiO2 40%/ Peep 14). Pt tolerating adjustment well (Sats 96%)

## 2016-09-17 NOTE — Plan of Care (Signed)
  Interdisciplinary Goals of Care Family Meeting   Date carried out:: 09/17/2016  Location of the meeting: Unit  Member's involved: Physician, Bedside Registered Nurse and Family Member or next of kin  Durable Power of Attorney or acting medical decision maker: Wife & Son / Michael &  Heath  Discussion: We discussed goals of care for Michael Heath.  Discussed family's concerns regarding CODE STATUS and proceeding with tracheostomy placement. Family in agreement with proceeding with tracheostomy and we will plan for this tomorrow. They recognize her can be complications from the procedure. As per his living will and they feel his wishes, they wish to change him over to a Limited code where he will receive no ACLS medications, CPR, or defibrillation in the event of cardiac arrest. I have made the appropriate adjustments to the patient's CODE STATUS. I have also notified Dr. Molli KnockYacoub who will speak with the family regarding formal consent for the bedside percutaneous tracheostomy.  Code status: Limited Code or DNR with short term  Disposition: Continue current acute care  Time spent for the meeting: 10 minutes  Michael Heath 09/17/2016, 1:42 PM

## 2016-09-17 NOTE — Progress Notes (Signed)
SLP Cancellation Note  Patient Details Name: Michael Heath MRN: [id number] DOB: February 10, 1946   Cancelled treatment:       Reason Eval/Treat Not Completed: Medical issues which prohibited therapy. Will sign off at this time, please reorder when appropriate.    Isabele Lollar, Riley NearingBonnie Caroline 09/17/2016, 7:54 AM

## 2016-09-17 NOTE — Progress Notes (Signed)
Pharmacy Antibiotic Note  Michael Heath is a 71 y.o. male with possible PNA and also noted with C diff.  Pharmacy has been consulted for cefepime dosing (Day 4 of therapy). Patent is noted on po flagyl -WBC= 23.5, SCr= 1.5, CrCl ~ 60   Plan: -No Cefepime dose changes needed (consider a total of 7-8 days therapy) -Change flagyl to po vancomycin (discussed with Dr. Jamison NeighborNestor) -Will follow renal function and progress   Height: 6\' 1"  (185.4 cm) Weight: 232 lb 12.9 oz (105.6 kg) IBW/kg (Calculated) : 79.9  Temp (24hrs), Avg:98.6 F (37 C), Min:98.3 F (36.8 C), Max:99.1 F (37.3 C)   Recent Labs Lab 09/13/16 0306  09/14/16 0001 09/14/16 0406 09/14/16 1009 09/14/16 1020 09/15/16 0230 09/15/16 0903 09/16/16 0421 09/17/16 0413 09/17/16 0415  WBC 19.3*  --   --  16.7*  --   --  17.5*  --  20.6* 23.5*  --   CREATININE  --   < > 1.55*  --  1.41*  --   --  1.34* 1.48*  --  1.50*  LATICACIDVEN  --   --   --   --   --  1.1  --   --   --   --   --   < > = values in this interval not displayed.  Estimated Creatinine Clearance: 57.6 mL/min (by C-G formula based on SCr of 1.5 mg/dL (H)).    No Known Allergies  Antimicrobials this admission: 2/15 Vanc>>2/20; 2/25>> 2/28 2/15 Zosyn>>2/22 2/22 Cefepime>>2/24; 2/25>> 2/27 flagyl>  Microbiology results: 2/27 c diff 2/25 blood x2 ngtd 2/23 MRSA PCR>> neg 2/14 resp>> normal floraF 2/15 blood x2>> ngF  Thank you for allowing pharmacy to be a part of this patient's care.  Harland GermanAndrew Lester Crickenberger, Pharm D 09/17/2016 11:41 AM

## 2016-09-17 NOTE — Progress Notes (Signed)
Progress Note  Patient Name: Michael Heath Date of Encounter: 09/17/2016  Primary Cardiologist: Candor intubated. Spoke to the wife.   Inpatient Medications    Scheduled Meds: . ceFEPime (MAXIPIME) IV  1 g Intravenous Q8H  . chlorhexidine gluconate (MEDLINE KIT)  15 mL Mouth Rinse BID  . Chlorhexidine Gluconate Cloth  6 each Topical Daily  . famotidine  20 mg Per Tube BID  . feeding supplement (PRO-STAT SUGAR FREE 64)  60 mL Per Tube BID  . mouth rinse  15 mL Mouth Rinse QID  . metoprolol  5 mg Intravenous Q6H  . metroNIDAZOLE  500 mg Per Tube Q8H  . sodium chloride flush  10-40 mL Intracatheter Q12H   Continuous Infusions: . sodium chloride 10 mL/hr at 09/13/16 0700  . amiodarone 30 mg/hr (09/17/16 0700)  . dextrose 5 % and 0.45 % NaCl with KCl 40 mEq/L 10 mL/hr at 09/17/16 0700  . feeding supplement (VITAL 1.5 CAL) 1,000 mL (09/17/16 0700)  . heparin 1,700 Units/hr (09/17/16 0700)  . propofol (DIPRIVAN) infusion 35 mcg/kg/min (09/17/16 0700)   PRN Meds: sodium chloride, albuterol, artificial tears, bisacodyl, fentaNYL (SUBLIMAZE) injection, haloperidol lactate, hydrALAZINE, labetalol, midazolam, ondansetron, sodium chloride flush   Vital Signs    Vitals:   09/17/16 0754 09/17/16 0800 09/17/16 0840 09/17/16 0900  BP:  110/64  127/73  Pulse:  76  81  Resp:  (!) 22  (!) 29  Temp: 98.9 F (37.2 C)     TempSrc: Oral     SpO2:  97% 96% 95%  Weight:      Height:        Intake/Output Summary (Last 24 hours) at 09/17/16 1011 Last data filed at 09/17/16 0700  Gross per 24 hour  Intake           2944.4 ml  Output             1090 ml  Net           1854.4 ml   Filed Weights   09/15/16 0600 09/16/16 0400 09/17/16 0600  Weight: 211 lb 10.3 oz (96 kg) 220 lb 10.9 oz (100.1 kg) 232 lb 12.9 oz (105.6 kg)    Telemetry    Sinus rhythm without episodes of A. fib in lieu of the last 24 hours  - Personally Reviewed  ECG     Normal sinus  rhythm with NSSTTWA - Personally reviewed.  Physical Exam   GEN:  Re-intubated. Neck: No JVD Cardiac: Regular rhythm , no murmurs, rubs, or gallops.  Respiratory: few rhonchi  GI: Soft, nontender, non-distended  MS: mild leg  edema; No deformity. Neuro:   awake and alert. Right sided weakness. Psych:  Seems to be normal   Labs    Chemistry  Recent Labs Lab 09/15/16 0903 09/16/16 0421 09/17/16 0415  NA 149* 148* 146*  K 3.6 3.2* 3.5  CL 118* 119* 117*  CO2 27 23 22   GLUCOSE 136* 164* 161*  BUN 37* 44* 40*  CREATININE 1.34* 1.48* 1.50*  CALCIUM 8.1* 7.6* 7.9*  ALBUMIN  --  1.8* 1.8*  GFRNONAA 52* 46* 45*  GFRAA 60* 53* 52*  ANIONGAP 4* 6 7     Hematology  Recent Labs Lab 09/15/16 0230 09/16/16 0421 09/17/16 0413  WBC 17.5* 20.6* 23.5*  RBC 3.10* 2.78* 2.85*  HGB 9.4* 8.5* 8.7*  HCT 30.8* 27.7* 28.3*  MCV 99.4 99.6 99.3  MCH 30.3 30.6 30.5  MCHC 30.5 30.7 30.7  RDW 18.5* 18.6* 19.2*  PLT 384 329 342    Cardiac Enzymes No results for input(s): TROPONINI in the last 168 hours. No results for input(s): TROPIPOC in the last 168 hours.   BNPNo results for input(s): BNP, PROBNP in the last 168 hours.   DDimer No results for input(s): DDIMER in the last 168 hours.   Radiology    Dg Chest Port 1 View  Result Date: 09/16/2016 CLINICAL DATA:  New onset of fever, intubated patient status post exploratory laparotomy on September 03, 2016 and placement of an abdominal aortic stent graft on September 02, 2016 for ruptured aortic aneurysm. EXAM: PORTABLE CHEST 1 VIEW COMPARISON:  Portable chest x-ray of September 15, 2016 FINDINGS: The lungs are reasonably well inflated. There are persistent increased densities in the retrocardiac region on the left and at the right lung base. These findings are not greatly changed from yesterday's study. There is no large pleural effusion and no pneumothorax. The heart is normal in size. The pulmonary vascularity is not engorged. There is  calcification in the wall of the aortic arch. The endotracheal tube tip lies 5.6 cm above the carina. The left-sided PICC line tip projects over the distal third of the SVC. The feeding tube tip projects below the inferior margin of the image. IMPRESSION: Persistent bibasilar atelectasis or pneumonia. No CHF nor large pleural effusion. The support tubes are in stable position. Thoracic aortic atherosclerosis. Electronically Signed   By: Dwon  Martinique M.D.   On: 09/16/2016 08:13   Portable Chest Xray  Result Date: 09/15/2016 CLINICAL DATA:  Patient intubated 1 hour ago. EXAM: PORTABLE CHEST 1 VIEW COMPARISON:  09/15/2016 FINDINGS: Endotracheal tube with the tip 3.4 cm above the carina. Feeding tube coursing below the diaphragm with the tip not visualized. Left-sided PICC line with the tip projecting over the cavoatrial junction. Bilateral small pleural effusions. Bilateral airspace disease likely reflecting atelectasis. No pneumothorax. Stable cardiomediastinal silhouette. No acute osseous abnormality. Degenerative changes of bilateral acromioclavicular joints, right worse than left. IMPRESSION: 1. Endotracheal tube with the tip 3.4 cm above the carina. 2. Stable small bilateral pleural effusions and bibasilar atelectasis. Electronically Signed   By: Kathreen Devoid   On: 09/15/2016 14:23   Dg Abd Portable 1v  Result Date: 09/17/2016 CLINICAL DATA:  Assess feeding tube position. EXAM: PORTABLE ABDOMEN - 1 VIEW COMPARISON:  KUB of September 15, 2016 FINDINGS: The radiodense tip of the feeding tube lies in the pyloric region or proximal duodenum. It has not curved in the C-loop as yet. The visualized bowel gas pattern is unremarkable. There is an aorto bi-iliac stent graft in place. IMPRESSION: The tip of the radiodense feeding tube lies in the region of the pylorus or first part of the duodenum. Electronically Signed   By: Jael  Martinique M.D.   On: 09/17/2016 09:07    Cardiac Studies   Telemetry :  Sinus  rhythm/sinus bradycardia. Personally reviewed on 09/16/16:  Patient Profile     71 y.o. male with ruptured AAA , surgical repair, dysfunction, prolonged intubation due to respiratory failure, and acute illness related atrial fibrillation.   Assessment & Plan    1. Acute illness related paroxysmal atrial fibrillation. We'll convert IV amiodarone to per NG dosing. Will also add low-dose per NG beta blocker therapy.   2. Respiratory failure requiring reintubation.     Signed, Sinclair Grooms, MD  09/17/2016, 10:11 AM

## 2016-09-17 NOTE — Progress Notes (Signed)
Spoke with family again at length today.  Will plan for Trach soon.  Limited code now.  Michael Heath

## 2016-09-18 ENCOUNTER — Inpatient Hospital Stay (HOSPITAL_COMMUNITY): Payer: Medicare Other

## 2016-09-18 LAB — CULTURE, RESPIRATORY W GRAM STAIN: Special Requests: NORMAL

## 2016-09-18 LAB — CBC WITH DIFFERENTIAL/PLATELET
BASOS PCT: 0 %
Basophils Absolute: 0 10*3/uL (ref 0.0–0.1)
EOS PCT: 3 %
Eosinophils Absolute: 0.5 10*3/uL (ref 0.0–0.7)
HEMATOCRIT: 25.6 % — AB (ref 39.0–52.0)
Hemoglobin: 7.8 g/dL — ABNORMAL LOW (ref 13.0–17.0)
LYMPHS ABS: 2.2 10*3/uL (ref 0.7–4.0)
Lymphocytes Relative: 12 %
MCH: 30.4 pg (ref 26.0–34.0)
MCHC: 30.5 g/dL (ref 30.0–36.0)
MCV: 99.6 fL (ref 78.0–100.0)
MONO ABS: 1.3 10*3/uL — AB (ref 0.1–1.0)
Monocytes Relative: 7 %
NEUTROS ABS: 14 10*3/uL — AB (ref 1.7–7.7)
Neutrophils Relative %: 78 %
Platelets: 289 10*3/uL (ref 150–400)
RBC: 2.57 MIL/uL — ABNORMAL LOW (ref 4.22–5.81)
RDW: 19.9 % — AB (ref 11.5–15.5)
WBC: 18 10*3/uL — ABNORMAL HIGH (ref 4.0–10.5)

## 2016-09-18 LAB — GLUCOSE, CAPILLARY
GLUCOSE-CAPILLARY: 107 mg/dL — AB (ref 65–99)
GLUCOSE-CAPILLARY: 92 mg/dL (ref 65–99)
Glucose-Capillary: 127 mg/dL — ABNORMAL HIGH (ref 65–99)
Glucose-Capillary: 127 mg/dL — ABNORMAL HIGH (ref 65–99)
Glucose-Capillary: 109 mg/dL — ABNORMAL HIGH (ref 65–99)
Glucose-Capillary: 109 mg/dL — ABNORMAL HIGH (ref 65–99)

## 2016-09-18 LAB — RENAL FUNCTION PANEL
Albumin: 1.7 g/dL — ABNORMAL LOW (ref 3.5–5.0)
Anion gap: 4 — ABNORMAL LOW (ref 5–15)
BUN: 38 mg/dL — AB (ref 6–20)
CHLORIDE: 119 mmol/L — AB (ref 101–111)
CO2: 25 mmol/L (ref 22–32)
CREATININE: 1.47 mg/dL — AB (ref 0.61–1.24)
Calcium: 7.7 mg/dL — ABNORMAL LOW (ref 8.9–10.3)
GFR calc Af Amer: 54 mL/min — ABNORMAL LOW (ref >60)
GFR calc non Af Amer: 46 mL/min — ABNORMAL LOW (ref >60)
GLUCOSE: 120 mg/dL — AB (ref 65–99)
Phosphorus: 2.8 mg/dL (ref 2.5–4.6)
Potassium: 3.3 mmol/L — ABNORMAL LOW (ref 3.5–5.1)
SODIUM: 148 mmol/L — AB (ref 135–145)

## 2016-09-18 LAB — MAGNESIUM: Magnesium: 2.3 mg/dL (ref 1.7–2.4)

## 2016-09-18 LAB — CULTURE, RESPIRATORY

## 2016-09-18 LAB — HEPARIN LEVEL (UNFRACTIONATED)

## 2016-09-18 MED ORDER — AMIODARONE HCL 200 MG PO TABS
200.0000 mg | ORAL_TABLET | Freq: Every day | ORAL | Status: DC
Start: 2016-09-25 — End: 2016-09-22

## 2016-09-18 MED ORDER — FENTANYL CITRATE (PF) 100 MCG/2ML IJ SOLN
25.0000 ug | INTRAMUSCULAR | Status: DC | PRN
Start: 2016-09-18 — End: 2016-09-19

## 2016-09-18 MED ORDER — POTASSIUM CHLORIDE 20 MEQ/15ML (10%) PO SOLN
60.0000 meq | Freq: Once | ORAL | Status: AC
  Administered 2016-09-18: 60 meq
  Filled 2016-09-18: qty 45

## 2016-09-18 NOTE — Procedures (Signed)
Percutaneous Tracheostomy Placement  Consent from family.  Patient sedated, paralyzed and position.  Placed on 100% FiO2 and RR matched.  Area cleaned and draped.  Lidocaine/epi injected.  Skin incision done followed by blunt dissection.  Trachea palpated then punctured, catheter passed and visualized bronchoscopically.  Wire placed and visualized.  Catheter removed.  Airway then crushed and dilated.  Size 6 cuffed shiley trach placed and visualized bronchoscopically well above carina.  Good volume returns.  Patient tolerated the procedure well without complications.  Minimal blood loss.  CXR ordered and pending.  Michael Heath, M.D. [facility]. Pager: [phone]. After hours pager: [phone]. 

## 2016-09-18 NOTE — Progress Notes (Signed)
Physical Therapy Treatment Patient Details Name: Michael Heath MRN: [id number] DOB: Jan 28, 1946 Today's Date: 09/18/2016    History of Present Illness Patient is a 71 yo male admitted 09/01/2016 with Rt flank pain.  Patient with ruptured AAA, now s/p repair.  Post-op retroperitoneal bleed with abd compartment syndrome.  Post-op Afib and acute hypoxic resp failure, intubated 08/02/16.  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  Extubated 09/12/16.    PMH:  CAD, CKD, HTN, HLD    PT Comments    Pt able to sit at EOB and maintain balance for brief periods of time and close guard assist. Pt able to perform squat-lateral scoot up EOB with +2 max assist. Pt providing assist with LEs. PT to continue to follow and progress mobility as tolerated. Continuing to recommend SNF for further rehabilitation followng acute stay.    Follow Up Recommendations  SNF;Supervision/Assistance - 24 hour     Equipment Recommendations  None recommended by PT    Recommendations for Other Services       Precautions / Restrictions Precautions Precautions: Fall Precaution Comments: right sided weakness Restrictions Weight Bearing Restrictions: No    Mobility  Bed Mobility Overal bed mobility: Needs Assistance Bed Mobility: Supine to Sit;Sit to Supine     Supine to sit: Total assist;+2 for physical assistance;+2 for safety/equipment;HOB elevated Sit to supine: Total assist;+2 for physical assistance;+2 for safety/equipment   General bed mobility comments: Encouraging pt to assist with moving LEs and trunk.   Transfers Overall transfer level: Needs assistance Equipment used: None Transfers:  (lateral squat scoot up edge of bed, using bed pad to assist)              Ambulation/Gait                 Stairs            Wheelchair Mobility    Modified Rankin (Stroke Patients Only) Modified Rankin (Stroke Patients Only) Pre-Morbid Rankin Score: No symptoms Modified  Rankin: Severe disability     Balance Overall balance assessment: Needs assistance Sitting-balance support: Bilateral upper extremity supported;Feet supported Sitting balance-Leahy Scale: Poor Sitting balance - Comments: Pt able to hold balance while sitting for brief periods of time.                             Cognition Arousal/Alertness: Awake/alert Behavior During Therapy: Flat affect Overall Cognitive Status: Difficult to assess                 General Comments: Pt following commands consistently during session    Exercises      General Comments General comments (skin integrity, edema, etc.): wrist restraints applied following session. HR staying between 71 and low 80s, O2 sats in 90s throughout.       Pertinent Vitals/Pain Pain Assessment: No/denies pain    Home Living                      Prior Function            PT Goals (current goals can now be found in the care plan section) Acute Rehab PT Goals PT Goal Formulation: With patient Time For Goal Achievement: 09/27/16 Potential to Achieve Goals: Good Progress towards PT goals: Progressing toward goals    Frequency    Min 3X/week      PT Plan Current plan remains appropriate    Co-evaluation  End of Session Equipment Utilized During Treatment:  (ventilator) Activity Tolerance: Patient tolerated treatment well Patient left: in bed;with call bell/phone within reach;with bed alarm set;with restraints reapplied Nurse Communication: Mobility status PT Visit Diagnosis: Other abnormalities of gait and mobility (R26.89);Muscle weakness (generalized) (M62.81);Hemiplegia and hemiparesis Hemiplegia - Right/Left: Right Hemiplegia - dominant/non-dominant: Non-dominant Hemiplegia - caused by: Cerebral infarction     Time: 1610-9604 PT Time Calculation (min) (ACUTE ONLY): 29 min  Charges:  $Therapeutic Activity: 23-37 mins                    G Codes:        Christiane Ha, PT, CSCS Pager [phone] Office [phone]  09/18/2016, 4:37 PM

## 2016-09-18 NOTE — Progress Notes (Addendum)
[facility] Progress Note  Subjective    Intubated. Follows commands.   Objective Vitals:   09/18/16 0800 09/18/16 0807  BP: 119/62   Pulse: 74   Resp: (!) 22   Temp:  97.5 F (36.4 C)    Intake/Output Summary (Last 24 hours) at 09/18/16 0945 Last data filed at 09/18/16 0800  Gross per 24 hour  Intake           1821.7 ml  Output             1430 ml  Net            391.7 ml   Abdomen soft and non tender. Midline incision clean Palpable DP pulses bilaterally   Assessment/Planning: 71 y.o. male is s/p: repair ruptured AAA, reintubation 15 Days Post-Op   Following commands.  NSR this am.  Patient with partial code. For tracheostomy today.  Appreciate cardiology and CCM assistance.   Raymond GurneyKimberly A Trinh 09/18/2016 9:45 AM --  Laboratory CBC    Component Value Date/Time   WBC 18.0 (H) 09/18/2016 0440   HGB 7.8 (L) 09/18/2016 0440   HCT 25.6 (L) 09/18/2016 0440   HCT 47.6 05/01/2016 1001   PLT 289 09/18/2016 0440   PLT 230 05/01/2016 1001    BMET    Component Value Date/Time   NA 148 (H) 09/18/2016 0439   NA 134 08/25/2016 1335   K 3.3 (L) 09/18/2016 0439   CL 119 (H) 09/18/2016 0439   CO2 25 09/18/2016 0439   GLUCOSE 120 (H) 09/18/2016 0439   BUN 38 (H) 09/18/2016 0439   BUN 26 08/25/2016 1335   CREATININE 1.47 (H) 09/18/2016 0439   CALCIUM 7.7 (L) 09/18/2016 0439   GFRNONAA 46 (L) 09/18/2016 0439   GFRAA 54 (L) 09/18/2016 0439    COAG Lab Results  Component Value Date   INR 1.25 09/17/2016   INR 1.32 09/04/2016   INR 1.41 09/08/2016   No results found for: PTT  Antibiotics Anti-infectives    Start     Dose/Rate Route Frequency Ordered Stop   09/17/16 1800  vancomycin (VANCOCIN) 50 mg/mL oral solution 125 mg     125 mg Oral Every 6 hours 09/17/16 1143 10/01/16 1759   09/16/16 1400  metroNIDAZOLE (FLAGYL) 50 mg/ml oral suspension 500 mg  Status:  Discontinued     500 mg Per Tube Every 8 hours 09/16/16 1237 09/17/16 1143   09/14/16 1400  ceFEPIme (MAXIPIME) 1 g in dextrose 5 % 50 mL IVPB     1 g 100 mL/hr over 30 Minutes Intravenous Every 8 hours 09/14/16 1027     09/14/16 1030  vancomycin (VANCOCIN) IVPB 750 mg/150 ml premix  Status:  Discontinued     750 mg 150 mL/hr over 60 Minutes Intravenous Every 12 hours 09/14/16 1027 09/17/16 0852   09/11/16 1200  ceFEPIme (MAXIPIME) 2 g in dextrose 5 % 50 mL IVPB  Status:  Discontinued     2 g 100 mL/hr over 30 Minutes Intravenous Every 24 hours 09/11/16 1014 09/13/16 0911   09/04/16 1600  vancomycin (VANCOCIN) IVPB 750 mg/150 ml premix  Status:  Discontinued     750 mg 150 mL/hr over 60 Minutes Intravenous Every 12 hours 09/04/16 1451 09/09/16 0850   09/04/16 1500  piperacillin-tazobactam (ZOSYN) IVPB 3.375 g  Status:  Discontinued     3.375 g 12.5 mL/hr over 240 Minutes Intravenous Every 8 hours 09/04/16 1451 09/11/16 0908   09/14/2016 1900  cefUROXime (  ZINACEF) 1.5 g in dextrose 5 % 50 mL IVPB  Status:  Discontinued     1.5 g 100 mL/hr over 30 Minutes Intravenous  Once 08/21/2016 1849 09/11/16 0908   Sep 08, 2016 2015  cefUROXime (ZINACEF) 1.5 g in dextrose 5 % 50 mL IVPB     1.5 g 100 mL/hr over 30 Minutes Intravenous Every 12 hours 2016-09-08 2006 09/07/2016 1013       [name], PA-C [facility] Office: [phone] Pager: [phone] 09/18/2016 9:45 AM  Agree with the above.  For Trach today  Durene Cal

## 2016-09-18 NOTE — Procedures (Signed)
Bedside Tracheostomy Insertion Procedure Note   Patient Details:   Name: Michael Heath DOB: 1945/10/19 MRN: [id number]  Procedure: Tracheostomy  Pre Procedure Assessment: ET Tube Size: 8.0 ET Tube secured at lip (cm): 24 Bite block in place: Yes Breath Sounds: Clear  Post Procedure Assessment: BP 114/64 (BP Location: Right Leg)   Pulse 64   Temp 97.5 F (36.4 C) (Axillary)   Resp (!) 22   Ht 6\' 1"  (1.854 m)   Wt 223 lb 12.3 oz (101.5 kg)   SpO2 96%   BMI 29.52 kg/m  O2 sats: stable throughout Complications: No apparent complications Patient did tolerate procedure well Tracheostomy Brand:Shiley Tracheostomy Style:Cuffed Tracheostomy Size: 6.0 Tracheostomy Secured WUJ:[id number]:Sutures Tracheostomy Placement Confirmation:Trach cuff visualized and in place    Lysbeth PennerSmith, Rheannon Cerney [facility] 09/18/2016, 1:10 PM

## 2016-09-18 NOTE — Progress Notes (Signed)
Progress Note  Patient Name: Michael Heath Date of Encounter: 09/18/2016  Primary Cardiologist: [place] intubated.  Converted amiodarone 2 NG yesterday.   Inpatient Medications    Scheduled Meds: . [START ON Oct 14, 2016] amiodarone  200 mg Per Tube Daily  . amiodarone  400 mg Per NG tube Daily  . ceFEPime (MAXIPIME) IV  1 g Intravenous Q8H  . chlorhexidine gluconate (MEDLINE KIT)  15 mL Mouth Rinse BID  . Chlorhexidine Gluconate Cloth  6 each Topical Daily  . etomidate  40 mg Intravenous Once  . famotidine  20 mg Per Tube BID  . feeding supplement (PRO-STAT SUGAR FREE 64)  60 mL Per Tube BID  . fentaNYL (SUBLIMAZE) injection  200 mcg Intravenous Once  . mouth rinse  15 mL Mouth Rinse QID  . metoprolol tartrate  12.5 mg Per NG tube BID  . midazolam  4 mg Intravenous Once  . propofol  5-80 mcg/kg/min Intravenous Once  . sodium chloride flush  10-40 mL Intracatheter Q12H  . vancomycin  125 mg Oral Q6H  . vecuronium  10 mg Intravenous Once   Continuous Infusions: . sodium chloride 10 mL/hr at 09/13/16 0700  . dextrose 5 % and 0.45 % NaCl with KCl 40 mEq/L 10 mL/hr at 09/18/16 0909  . feeding supplement (VITAL 1.5 CAL) Stopped (09/18/16 0000)  . heparin Stopped (09/18/16 0000)  . propofol (DIPRIVAN) infusion 15 mcg/kg/min (09/18/16 1029)   PRN Meds: sodium chloride, albuterol, artificial tears, bisacodyl, fentaNYL (SUBLIMAZE) injection, haloperidol lactate, hydrALAZINE, labetalol, midazolam, ondansetron, sodium chloride flush   Vital Signs    Vitals:   09/18/16 0807 09/18/16 0840 09/18/16 0900 09/18/16 1000  BP:   115/61 126/73  Pulse:   68 68  Resp:   19 (!) 22  Temp: 97.5 F (36.4 C)     TempSrc: Axillary     SpO2:  99% 98% 98%  Weight:      Height:        Intake/Output Summary (Last 24 hours) at 09/18/16 1047 Last data filed at 09/18/16 1000  Gross per 24 hour  Intake          1791.59 ml  Output             1480 ml  Net            311.59 ml   Filed Weights   09/16/16 0400 09/17/16 0600 09/18/16 0500  Weight: 220 lb 10.9 oz (100.1 kg) 232 lb 12.9 oz (105.6 kg) 223 lb 12.3 oz (101.5 kg)    Telemetry    Sinus rhythm without episodes of A. fib in lieu of the last 24 hours  - Personally Reviewed  ECG     Normal sinus rhythm with NSSTTWA - Personally reviewed.  Physical Exam   GEN:  Re-intubated. Neck: No JVD Cardiac: Regular rhythm , no murmurs, rubs, or gallops.  Respiratory: few rhonchi  GI: Soft, nontender, non-distended  MS: mild leg  edema; No deformity. Neuro:   awake and alert. Right sided weakness. Psych:  Seems to be normal   Labs    Chemistry  Recent Labs Lab 09/16/16 0421 09/17/16 0415 09/18/16 0439  NA 148* 146* 148*  K 3.2* 3.5 3.3*  CL 119* 117* 119*  CO2 _0 GLUCOSE 164* 161* 120*  BUN 44* 40* 38*  CREATININE 1.48* 1.50* 1.47*  CALCIUM 7.6* 7.9* 7.7*  ALBUMIN 1.8* 1.8* 1.7*  GFRNONAA 46* 45* 46*  GFRAA 53* 52* 54*  ANIONGAP 6 7 4*     Hematology  Recent Labs Lab 09/16/16 0421 09/17/16 0413 09/18/16 0440  WBC 20.6* 23.5* 18.0*  RBC 2.78* 2.85* 2.57*  HGB 8.5* 8.7* 7.8*  HCT 27.7* 28.3* 25.6*  MCV 99.6 99.3 99.6  MCH 30.6 30.5 30.4  MCHC 30.7 30.7 30.5  RDW 18.6* 19.2* 19.9*  PLT 329 342 289    Cardiac Enzymes No results for input(s): TROPONINI in the last 168 hours. No results for input(s): TROPIPOC in the last 168 hours.   BNPNo results for input(s): BNP, PROBNP in the last 168 hours.   DDimer No results for input(s): DDIMER in the last 168 hours.   Radiology    Dg Abd Portable 1v  Result Date: 09/17/2016 CLINICAL DATA:  Assess feeding tube position. EXAM: PORTABLE ABDOMEN - 1 VIEW COMPARISON:  KUB of September 15, 2016 FINDINGS: The radiodense tip of the feeding tube lies in the pyloric region or proximal duodenum. It has not curved in the C-loop as yet. The visualized bowel gas pattern is unremarkable. There is an aorto bi-iliac stent graft in  place. IMPRESSION: The tip of the radiodense feeding tube lies in the region of the pylorus or first part of the duodenum. Electronically Signed   By: Latrell  Martinique M.D.   On: 09/17/2016 09:07    Cardiac Studies   Telemetry :  Sinus rhythm/sinus bradycardia. Personally reviewed on 09/16/16:  Patient Profile     71 y.o. male with ruptured AAA , surgical repair, dysfunction, prolonged intubation due to respiratory failure, and acute illness related atrial fibrillation.   Assessment & Plan    1. Acute illness related paroxysmal atrial fibrillation. Maintaining normal sinus rhythm and some sinus bradycardia. May need to discontinue beta blocker if bradycardia is persistently less than 55 bpm. 2. Respiratory failure requiring reintubation.  We will sign off for now. Amiodarone has been written to taper. Please call if we may be of further assistance.   Signed, Sinclair Grooms, MD  09/18/2016, 10:47 AM

## 2016-09-18 NOTE — Progress Notes (Signed)
RT note- cuff pressure high 40, however has a leak otherwise.

## 2016-09-18 NOTE — Procedures (Signed)
Video Bronchoscopy Procedure Note  Pre-Procedure Diagnoses: 1.  Acute Hypoxic Respiratory Failure  Post-Procedure Diagnoses: 1.  Acute Hypoxic Respiratory Failure  Procedures Performed: 1. Bronchoscopy with Airway Inspection 2. Percutaneous Tracheostomy  Consent:  Informed consent was obtained from the patient's wife after discussing the risks and benefits of the procedure including bleeding, infection, pneumothorax, medication allergy, vocal chord injury, and potentially death.  Sedation:  As noted by nursing documentation during procedure.   Description of Procedure:  Procedure performed in the patient's ICU room. Timeout performed to identify correct patient and correct procedure. Video bronchoscope was inserted into the patient's endotracheal tube. Secretions were suctioned from the lumen of the endotracheal tube and then bronchoscope was inserted into the remainder of the trachea and bilateral mainstem bronchi. Scant, clear secretions were suctioned from all airways. Endotracheal tube was repositioned to allow visualization. Dr. Molli KnockYacoub proceeded with percutaneous tracheostomy. Direct visualization of guidewire placement and subsequent sequential dilatation and ultimately tracheostomy placement was confirmed with direct visualization. Bronchoscope was then removed from the endotracheal tube and inserted into the lumen of the new tracheostomy. Direct visualization of the carina was confirmed with bronchoscope. Bronchoscope was then removed from the tracheostomy. Inner cannula was inserted and patient was placed on mechanical ventilation.  Blood Loss:  Less than 10cc.  Complications:  None.  Post Procedure Stat Portable CXR:  Ordered and pending.  Post Procedure Instructions: Post percutaneous tracheostomy order set entered.

## 2016-09-18 NOTE — Progress Notes (Signed)
[facility] Attending Note  Presenting HPI:  71 y.o. male with past medical history of hypertension, hyperlipidemia, CKD, stage III. Admitted today with right flank, back pain. A CTA which showed ruptured AAA. He was transferred emergently to [facility] for stent repair. In the OR he received 16 units PRBC, 10 units FFP, 3 units platelets, 1 unit cryo and 1 Cell Saver. He developed abdominal comparment syndrome in OR from massive RP bleed. He is brought back to the ICU with an open abdomen. PCCM consulted for vent, sedation management.  Subjective:  No acute events overnight. Patient planned for tracheostomy today at bedside. Switched to Amiodarone via tube yesterday. Tube feedings & heparin drip on hold for his procedure.   Review of Systems:  Unable to obtain with patient intubated & sedated.  Temp:  [97.9 F (36.6 C)-99.2 F (37.3 C)] 97.9 F (36.6 C) (03/01 0400) Pulse Rate:  [60-97] 72 (03/01 0700) Resp:  [1-31] 22 (03/01 0700) BP: (95-151)/(54-93) 119/60 (03/01 0700) SpO2:  [89 %-100 %] 100 % (03/01 0700) FiO2 (%):  [40 %] 40 % (03/01 0600) Weight:  [223 lb 12.3 oz (101.5 kg)] 223 lb 12.3 oz (101.5 kg) (03/01 0500)   Gen.: Patient awake. No acute distress. No family at bedside. Neurological: Following commands in all 4 extremities. Continued weakness in right upper extremity unable to give a thumbs up and able to squeeze and relax on command. Tracks to voice and nods to questions. Integument: Warm and dry without rash on exposed skin. Cardiovascular: Regular rate and rhythm. Sinus rhythm. Mild anasarca. Pulmonary: Coarse breath sounds bilaterally but otherwise symmetric chest wall rise on ventilator. Abdomen: Soft. Protuberant. Normal bowel sounds. HEENT: No scleral icterus. Endotracheal tube in place.  LINES/TUBES: OGT 2/13 - 2/23 R IJ CORDIS 2/13 - Out (? Date) OETT 8.0 2/13 - 2/23; 8.0 2/26 >>> LUE DL PICC 2/20 >>> NGT 2/26 >>> Foley 2/13 >>> PIV  CBC  Latest Ref Rng & Units 09/18/2016 09/17/2016 09/16/2016  WBC 4.0 - 10.5 K/uL 18.0(H) 23.5(H) 20.6(H)  Hemoglobin 13.0 - 17.0 g/dL 7.8(L) 8.7(L) 8.5(L)  Hematocrit 39.0 - 52.0 % 25.6(L) 28.3(L) 27.7(L)  Platelets 150 - 400 K/uL 289 342 329    BMP Latest Ref Rng & Units 09/18/2016 09/17/2016 09/16/2016  Glucose 65 - 99 mg/dL 120(H) 161(H) 164(H)  BUN 6 - 20 mg/dL 38(H) 40(H) 44(H)  Creatinine 0.61 - 1.24 mg/dL 1.47(H) 1.50(H) 1.48(H)  BUN/Creat Ratio 10 - 24 - - -  Sodium 135 - 145 mmol/L 148(H) 146(H) 148(H)  Potassium 3.5 - 5.1 mmol/L 3.3(L) 3.5 3.2(L)  Chloride 101 - 111 mmol/L 119(H) 117(H) 119(H)  CO2 22 - 32 mmol/L 25 22 23   Calcium 8.9 - 10.3 mg/dL 7.7(L) 7.9(L) 7.6(L)   Hepatic Function Latest Ref Rng & Units 09/18/2016 09/17/2016 09/16/2016  Total Protein 6.5 - 8.1 g/dL - - -  Albumin 3.5 - 5.0 g/dL 1.7(L) 1.8(L) 1.8(L)  AST 15 - 41 U/L - - -  ALT 17 - 63 U/L - - -  Alk Phosphatase 38 - 126 U/L - - -  Total Bilirubin 0.3 - 1.2 mg/dL - - -  Bilirubin, Direct 0.1 - 0.5 mg/dL - - -    IMAGING/STUDIES: CTA CHEST/ABD/PELVIS 2/13:  Acutely ruptured 7.8 cm infrarenal abdominal aortic aneurysm with a large right retroperitoneal hematoma. Moderate pulmonary emphysema. Small hiatal hernia. Incidental renal cysts. Colonic diverticulosis. Cholelithiasis. TTE 2/15: LV normal in size with mild concentric hypertrophy. EF 60-65% with normal regional  wall motion. Grade 1 diastolic dysfunction without evidence of elevated filling pressure. LA mildly dilated & RA normal in size. RV normal in size and function. No aortic stenosis or regurgitation. Aortic root normal in size. No mitral stenosis or regurgitation. Pulmonic valve poorly visualized but without stenosis. Trivial tricuspid regurgitation. No pericardial effusion. MRI BRAIN W/O 2/20:  Multifocal acute ischemia, including the left frontal watershed region, possibly secondary to an acute hypotensive episode. The other scattered punctate foci of ischemia  are more suggestive of a central cardio embolic process. No acute hemorrhage or mass effect. MRI C-SPINE 2/20:  C5-C6 disc osteophyte complex causing severe spinal canal stenosis and severe left, moderate right foraminal narrowing. Mild narrowing at the right C4 and left C5 neural foramina. VENOUS DUPLEX RUE 2/20:  Findings consistent with acute superficial vein thrombosis involving the right cephalic vein. No evidence of deep vein thrombosis involving the right upper extremity. No evidence of deep vein or superficial thrombosis involving the left upper extremity. BILATERAL CAROTID U/S 2/21:  The vertebral arteries appear patent with antegrade flow. Findings consistent with a 1- 39% stenosis involving the. right internal carotid artery and the left internal carotid artery.  MICROBIOLOGY: MRSA PCR 2/13: Negative Blood Cultures x2 2/15:  Negative  Tracheal Aspirate Culture 2/14: Normal respiratory flora Blood Cultures x2 2/25 >>> Tracheal Aspirate Culture 2/27 >>> Stool C Diff 2/27:  Toxin & Antigen Positive  ANTIBIOTICS: Zinacef 2/14 (periop prophylaxis) Zosyn 2/15 - 2/22 Vancomycin 2/15 - 2/28 Flagyl via tube 2/27 - 2/28 Cefepime 2/22 >>> Vancomycin PO 2/28 >>>  SIGNIFICANT EVENTS: 2/13 - Admit, OR for ruptured AAA. 2/14 - Back to OR for abdomina closure.  2/20 - Found to have superficial venous thrombus right upper extremity 2/21 - A fib w/ RVR started on Diltiazem 2/23 - Extubated to BiPAP 2/25 - Started on Amiodarone drip after bolus for A fib w/ RVR. Febrile>>recultured  ASSESSMENT/PLAN:  71 y.o. male with ruptured abdominal aortic aneurysm repair status post surgical repair. Resolved abdominal compartment syndrome. Ongoing acute hypoxic respiratory failure requiring reintubation and plan for tracheostomy at bedside today. Currently being treated for C. difficile colitis.  1. Acute hypoxic respiratory failure: Status post reintubation. Plan for tracheostomy placement at bedside  today. 2. Atrial fibrillation: Converted to normal sinus rhythm with amiodarone. Holding systemic anticoagulation in preparation for procedure. Continuing amiodarone via the tube. Continuous monitoring on telemetry. Cardiology following. 3. 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 No embolic source obvious. Maintaining normotension. 4. Sepsis: Repeat cultures pending. Likely secondary to C. difficile colitis versus healthcare associated pneumonia. Continuing cefepime and oral vancomycin. 5. C. difficile colitis: Currently on day #2 of oral vancomycin. Continuing enteric precautions. 6. Possible healthcare associated pneumonia: Continuing cefepime. Awaiting finalization of Acute h tracheal aspirate culture. 7. Abdominal aortic aneurysm rupture status post repair: Postoperative management per vascular surgery. 8. Abdominal compartment syndrome: Resolved. Status post abdominal closure. 9. Anemia: Hemoglobin stable. No evidence of active bleeding. Trending cell counts daily with CBC. 10. Hypoglycemia: Resolved. Continuing dextrose IV fluids will tube feedings are on hold. 11. Acute renal failure: Stable. Monitoring urine output with Foley as well as trend electrolytes and renal function daily.  12. Prolonged QTc. Checking EKG intermittently. Monitoring patient on telemetry. 13. Right upper extremity superficial venous thrombosis: Monitoring with blood pressure cuff off of this arm. 14. Right carotid artery stenosis: Mild. Anticoagulation on hold. 15. Dysphagia: Multifactorial.& present on admission. Plan for speech therapy consult once tracheostomy placed. 16. Delirium: Multifactorial. Continuing  current sedation.   Prophylaxis:  Holding systemic anticoagulation with heparin drip pending tracheostomy. Continuing SCDs & Pepcid via tube BID. Diet: Nothing by mouth. Holding tube feedings. Code Status: Limited code as per family discussion yesterday. Disposition: Remains  endotracheally intubated in the intensive care unit. Tracheostomy at bedside this morning at 11 AM. Family Update: Family updated by me after rounds yesterday. Also spoke with attending surgeon yesterday afternoon. No family at bedside during rounds today.  I have spent a total of 31 minutes of critical care time today caring for the patient and reviewing the patient's electronic medical record.  Sonia Baller Ashok Cordia, M.D. [facility] Pager:  [phone] After 3pm or if no response, call [phone] 7:39 AM 09/18/16

## 2016-09-18 DEATH — deceased

## 2016-09-19 DIAGNOSIS — A0472 Enterocolitis due to Clostridium difficile, not specified as recurrent: Secondary | ICD-10-CM

## 2016-09-19 LAB — CULTURE, BLOOD (ROUTINE X 2)
Culture: NO GROWTH
Culture: NO GROWTH

## 2016-09-19 LAB — GLUCOSE, CAPILLARY
GLUCOSE-CAPILLARY: 137 mg/dL — AB (ref 65–99)
GLUCOSE-CAPILLARY: 137 mg/dL — AB (ref 65–99)
GLUCOSE-CAPILLARY: 117 mg/dL — AB (ref 65–99)
Glucose-Capillary: 108 mg/dL — ABNORMAL HIGH (ref 65–99)
Glucose-Capillary: 120 mg/dL — ABNORMAL HIGH (ref 65–99)

## 2016-09-19 LAB — CBC WITH DIFFERENTIAL/PLATELET
BASOS ABS: 0.2 10*3/uL — AB (ref 0.0–0.1)
Basophils Relative: 1 %
Eosinophils Absolute: 0.7 10*3/uL (ref 0.0–0.7)
Eosinophils Relative: 4 %
HCT: 27.8 % — ABNORMAL LOW (ref 39.0–52.0)
HEMOGLOBIN: 8.3 g/dL — AB (ref 13.0–17.0)
LYMPHS PCT: 8 %
Lymphs Abs: 1.5 10*3/uL (ref 0.7–4.0)
MCH: 30.1 pg (ref 26.0–34.0)
MCHC: 29.9 g/dL — ABNORMAL LOW (ref 30.0–36.0)
MCV: 100.7 fL — ABNORMAL HIGH (ref 78.0–100.0)
MONOS PCT: 8 %
Monocytes Absolute: 1.5 10*3/uL — ABNORMAL HIGH (ref 0.1–1.0)
Neutro Abs: 14.6 10*3/uL — ABNORMAL HIGH (ref 1.7–7.7)
Neutrophils Relative %: 79 %
Platelets: 299 10*3/uL (ref 150–400)
RBC: 2.76 MIL/uL — AB (ref 4.22–5.81)
RDW: 20.5 % — ABNORMAL HIGH (ref 11.5–15.5)
WBC: 18.5 10*3/uL — AB (ref 4.0–10.5)

## 2016-09-19 LAB — RENAL FUNCTION PANEL
ALBUMIN: 1.8 g/dL — AB (ref 3.5–5.0)
Anion gap: 4 — ABNORMAL LOW (ref 5–15)
BUN: 38 mg/dL — AB (ref 6–20)
CO2: 21 mmol/L — ABNORMAL LOW (ref 22–32)
CREATININE: 1.42 mg/dL — AB (ref 0.61–1.24)
Calcium: 8.2 mg/dL — ABNORMAL LOW (ref 8.9–10.3)
Chloride: 119 mmol/L — ABNORMAL HIGH (ref 101–111)
GFR calc Af Amer: 56 mL/min — ABNORMAL LOW (ref >60)
GFR calc non Af Amer: 48 mL/min — ABNORMAL LOW (ref >60)
Glucose, Bld: 117 mg/dL — ABNORMAL HIGH (ref 65–99)
PHOSPHORUS: 3.1 mg/dL (ref 2.5–4.6)
Potassium: 3.6 mmol/L (ref 3.5–5.1)
SODIUM: 144 mmol/L (ref 135–145)

## 2016-09-19 LAB — TRIGLYCERIDES: TRIGLYCERIDES: 265 mg/dL — AB (ref <150)

## 2016-09-19 LAB — MAGNESIUM: MAGNESIUM: 2.2 mg/dL (ref 1.7–2.4)

## 2016-09-19 LAB — HEPARIN LEVEL (UNFRACTIONATED): HEPARIN UNFRACTIONATED: 0.29 [IU]/mL — AB (ref 0.30–0.70)

## 2016-09-19 MED ORDER — FENTANYL CITRATE (PF) 100 MCG/2ML IJ SOLN
12.5000 ug | INTRAMUSCULAR | Status: DC | PRN
  Administered 2016-09-23: 25 ug via INTRAVENOUS
  Filled 2016-09-19: qty 2

## 2016-09-19 MED ORDER — ACETAMINOPHEN 500 MG PO TABS
1000.0000 mg | ORAL_TABLET | Freq: Three times a day (TID) | ORAL | Status: DC
  Administered 2016-09-19 – 2016-09-21 (×5): 1000 mg
  Filled 2016-09-19 (×5): qty 2

## 2016-09-19 MED ORDER — ACETAMINOPHEN 325 MG PO TABS: 650.0000 mg | ORAL_TABLET | Freq: Three times a day (TID) | ORAL | Status: DC

## 2016-09-19 MED ORDER — HEPARIN (PORCINE) IN NACL 100-0.45 UNIT/ML-% IJ SOLN
1550.0000 [IU]/h | INTRAMUSCULAR | Status: DC
  Administered 2016-09-19 – 2016-09-20 (×3): 1700 [IU]/h via INTRAVENOUS
  Administered 2016-09-21 – 2016-09-23 (×4): 1550 [IU]/h via INTRAVENOUS
  Filled 2016-09-19 (×7): qty 250

## 2016-09-19 MED ORDER — MAGIC MOUTHWASH
10.0000 mL | Freq: Three times a day (TID) | ORAL | Status: DC
  Administered 2016-09-19 – 2016-09-23 (×13): 10 mL via ORAL
  Filled 2016-09-19 (×13): qty 10

## 2016-09-19 MED ORDER — DEXMEDETOMIDINE HCL IN NACL 400 MCG/100ML IV SOLN
0.4000 ug/kg/h | INTRAVENOUS | Status: DC
  Administered 2016-09-19 (×2): 0.6 ug/kg/h via INTRAVENOUS
  Administered 2016-09-19: 0.4 ug/kg/h via INTRAVENOUS
  Administered 2016-09-19 – 2016-09-20 (×5): 0.6 ug/kg/h via INTRAVENOUS
  Administered 2016-09-20: 0.4 ug/kg/h via INTRAVENOUS
  Administered 2016-09-20 – 2016-09-21 (×2): 0.6 ug/kg/h via INTRAVENOUS
  Administered 2016-09-21: 0.8 ug/kg/h via INTRAVENOUS
  Administered 2016-09-21: 0.6 ug/kg/h via INTRAVENOUS
  Administered 2016-09-21: 0.7 ug/kg/h via INTRAVENOUS
  Administered 2016-09-22: 0.9 ug/kg/h via INTRAVENOUS
  Filled 2016-09-19 (×2): qty 50
  Filled 2016-09-19 (×3): qty 100
  Filled 2016-09-19: qty 50
  Filled 2016-09-19: qty 100
  Filled 2016-09-19 (×4): qty 50
  Filled 2016-09-19 (×2): qty 100
  Filled 2016-09-19 (×2): qty 50

## 2016-09-19 MED ORDER — SODIUM CHLORIDE 0.9 % IV SOLN
INTRAVENOUS | Status: DC
  Administered 2016-09-19: 09:00:00 via INTRAVENOUS

## 2016-09-19 NOTE — Progress Notes (Signed)
[facility] Attending Note  Presenting HPI:  71 y.o. male with past medical history of hypertension, hyperlipidemia, CKD, stage III. Admitted today with right flank, back pain. A CTA which showed ruptured AAA. He was transferred emergently to [facility] for stent repair. In the OR he received 16 units PRBC, 10 units FFP, 3 units platelets, 1 unit cryo and 1 Cell Saver. He developed abdominal comparment syndrome in OR from massive RP bleed. He is brought back to the ICU with an open abdomen. PCCM consulted for vent, sedation management.  Subjective:  Patient underwent tracheostomy placement yesterday at bedside. Tube feedings restarted overnight. Heparin continued to be held overnight.  Review of Systems:  Unable to obtain with tracheostomy in place.  Vent Mode: PRVC FiO2 (%):  [40 %-50 %] 40 % Set Rate:  [22 bmp] 22 bmp Vt Set:  [640 mL] 640 mL PEEP:  [5 cmH20] 5 cmH20 Plateau Pressure:  [17 cmH20-19 cmH20] 17 cmH20  Temp:  [98.5 F (36.9 C)-99.2 F (37.3 C)] 98.5 F (36.9 C) (03/02 0800) Pulse Rate:  [57-93] 93 (03/02 0800) Resp:  [20-37] 37 (03/02 0800) BP: (91-132)/(52-82) 124/82 (03/02 0800) SpO2:  [92 %-100 %] 94 % (03/02 0800) FiO2 (%):  [40 %-50 %] 40 % (03/02 0743) Weight:  [224 lb 3.3 oz (101.7 kg)] 224 lb 3.3 oz (101.7 kg) (03/02 0500)   Gen.: Awake. No distress. Wife at bedside. Integument: Warm and dry. No rash on exposed skin. No bleeding around tracheostomy stoma. HEENT: Moist mucous membranes. #6 cuffed tracheostomy in place. No scleral icterus. Cardiovascular: Regular rate and rhythm. Sinus rhythm on telemetry. Persistent mild anasarca.  Pulmonary: Overall clear with auscultation. Normal work of breathing with tracheostomy on ventilator. Abdomen: Soft. Protuberant. Normal bowel sounds. Neurological: Following commands. Moving all 4 extremities. Still weak but no change within the strength of his right upper extremity.  LINES/TUBES: OGT 2/13 - 2/23 R  IJ CORDIS 2/13 - Out (? Date) OETT 8.0 2/13 - 2/23; 8.0 2/26 - 3/1 Perc Trach #6 Cuffed (JY) 3/1 >>> LUE DL PICC 2/20 >>> NGT 2/26 >>> Foley 2/13 >>> PIV  CBC Latest Ref Rng & Units 09/19/2016 09/18/2016 09/17/2016  WBC 4.0 - 10.5 K/uL 18.5(H) 18.0(H) 23.5(H)  Hemoglobin 13.0 - 17.0 g/dL 8.3(L) 7.8(L) 8.7(L)  Hematocrit 39.0 - 52.0 % 27.8(L) 25.6(L) 28.3(L)  Platelets 150 - 400 K/uL 299 289 342    BMP Latest Ref Rng & Units 09/19/2016 09/18/2016 09/17/2016  Glucose 65 - 99 mg/dL 117(H) 120(H) 161(H)  BUN 6 - 20 mg/dL 38(H) 38(H) 40(H)  Creatinine 0.61 - 1.24 mg/dL 1.42(H) 1.47(H) 1.50(H)  BUN/Creat Ratio 10 - 24 - - -  Sodium 135 - 145 mmol/L 144 148(H) 146(H)  Potassium 3.5 - 5.1 mmol/L 3.6 3.3(L) 3.5  Chloride 101 - 111 mmol/L 119(H) 119(H) 117(H)  CO2 22 - 32 mmol/L 21(L) 25 22  Calcium 8.9 - 10.3 mg/dL 8.2(L) 7.7(L) 7.9(L)   Hepatic Function Latest Ref Rng & Units 09/19/2016 09/18/2016 09/17/2016  Total Protein 6.5 - 8.1 g/dL - - -  Albumin 3.5 - 5.0 g/dL 1.8(L) 1.7(L) 1.8(L)  AST 15 - 41 U/L - - -  ALT 17 - 63 U/L - - -  Alk Phosphatase 38 - 126 U/L - - -  Total Bilirubin 0.3 - 1.2 mg/dL - - -  Bilirubin, Direct 0.1 - 0.5 mg/dL - - -    IMAGING/STUDIES: CTA CHEST/ABD/PELVIS 2/13:  Acutely ruptured 7.8 cm infrarenal abdominal aortic aneurysm  with a large right retroperitoneal hematoma. Moderate pulmonary emphysema. Small hiatal hernia. Incidental renal cysts. Colonic diverticulosis. Cholelithiasis. TTE 2/15: LV normal in size with mild concentric hypertrophy. EF 60-65% with normal regional wall motion. Grade 1 diastolic dysfunction without evidence of elevated filling pressure. LA mildly dilated & RA normal in size. RV normal in size and function. No aortic stenosis or regurgitation. Aortic root normal in size. No mitral stenosis or regurgitation. Pulmonic valve poorly visualized but without stenosis. Trivial tricuspid regurgitation. No pericardial effusion. MRI BRAIN W/O 2/20:   Multifocal acute ischemia, including the left frontal watershed region, possibly secondary to an acute hypotensive episode. The other scattered punctate foci of ischemia are more suggestive of a central cardio embolic process. No acute hemorrhage or mass effect. MRI C-SPINE 2/20:  C5-C6 disc osteophyte complex causing severe spinal canal stenosis and severe left, moderate right foraminal narrowing. Mild narrowing at the right C4 and left C5 neural foramina. VENOUS DUPLEX RUE 2/20:  Findings consistent with acute superficial vein thrombosis involving the right cephalic vein. No evidence of deep vein thrombosis involving the right upper extremity. No evidence of deep vein or superficial thrombosis involving the left upper extremity. BILATERAL CAROTID U/S 2/21:  The vertebral arteries appear patent with antegrade flow. Findings consistent with a 1- 39% stenosis involving the. right internal carotid artery and the left internal carotid artery.  MICROBIOLOGY: MRSA PCR 2/13: Negative Blood Cultures x2 2/15:  Negative  Tracheal Aspirate Culture 2/14: Normal respiratory flora Blood Cultures x2 2/25 >>> Tracheal Aspirate Culture 2/27:  Rare Candida albicans Stool C Diff 2/27:  Toxin & Antigen Positive  ANTIBIOTICS: Zinacef 2/14 (periop prophylaxis) Zosyn 2/15 - 2/22 Vancomycin 2/15 - 2/28 Flagyl via tube 2/27 - 2/28 Cefepime 2/22 - 3/2 Vancomycin PO 2/28 >>>  SIGNIFICANT EVENTS: 2/13 - Admit, OR for ruptured AAA. 2/14 - Back to OR for abdomina closure.  2/20 - Found to have superficial venous thrombus right upper extremity 2/21 - A fib w/ RVR started on Diltiazem 2/23 - Extubated to BiPAP 2/25 - Started on Amiodarone drip after bolus for A fib w/ RVR. Febrile>>recultured 2/28 - Transitioned from Amiodarone drip to Amiodarone via tube 3/01 - Trach placed by JY at bedside holding heparin drip for 12 hours prior  ASSESSMENT/PLAN:  71 y.o. male with history of ruptured abdominal aortic aneurysm  status post repair. Patient underwent percutaneous tracheostomy placement yesterday. No evidence of new infection outside of C. difficile colitis. Consolidating antibiotics further today.   1. Acute hypoxic respiratory failure: Status post percutaneous tracheostomy placement yesterday. Alternating 2 hours on tracheostomy collar and 6 hours of rest utilizing full ventilator support. Also keeping the patient on full ventilator support overnight. Further tracheostomy care per order set. 2. Atrial fibrillation: Currently normal sinus rhythm. Continuing amiodarone per cardiology recommendations. Resuming systemic anticoagulation with heparin drip. 3. Multifocal CVA: Seems to only have residual right upper extremity weakness with some dysphasia that is worsened from baseline as well as difficulty with speech. Plan for speech consult once patient is able to tolerate tracheostomy collar fully. Continuing PT/OT. 4. Sepsis: Secondary to C. difficile colitis. No evidence of other infectious source. Awaiting finalization of blood cultures. Discontinuing cefepime. 5. C. difficile colitis: Currently on day #3 of oral vancomycin. Continuing enteric precautions. 6. Possible healthcare associated pneumonia: No evidence of infection on repeat tracheostomy culture. Plan to repeat culture for increased secretions or fever. 7. Abdominal aortic aneurysm rupture: Status post repair. Postoperative management per vascular surgery. 8. Abdominal compartment syndrome:  Resolved. Status post abdominal closure. 9. Anemia: Hemoglobin stable. No evidence of active bleeding. Trending cell counts daily with CBC while on systemic anticoagulation. 10. Hypoglycemia: Resolved. Discontinuing dextrose IV fluids. 11. Acute renal failure: Stable. Continuing to trend urine output with Foley catheter. Monitoring electrolytes and renal function daily. 12. Prolonged QTc: Checking EKG intermittently. Monitoring patient on telemetry. 13. Right upper  extremity superficial venous thrombosis: Noted on vascular ultrasound. Keeping blood pressure cuff office arm. 14. Right carotid artery stenosis: Mild. Resuming anticoagulation. 15. Dysphasia: Multifactorial and present on admission. Plan for speech therapy consult once patient is tolerating tracheostomy collar fully. 16. Delirium: Multifactorial. Discontinuing propofol. Switching over to Precedex infusion.  Prophylaxis:  Restarting heparin drip. Continuing SCDs & Pepcid via tube BID. Diet: Nothing by mouth. Continuing tube feedings. Code Status: Limited code as per family discussion yesterday. Disposition: Consulting case management for long-term acute care facility placement moving forward with likely disposition early next week. Family Update: Wife updated during rounds this morning by myself.  I have spent a total of 33 minutes of critical care time today caring for the patient, discussing the plan of care with wife at bedside, and reviewing the patient's electronic medical record.  Sonia Baller Ashok Cordia, M.D. [facility] Pager:  [phone] After 3pm or if no response, call [phone] 9:08 AM 09/19/16

## 2016-09-19 NOTE — Progress Notes (Signed)
ANTICOAGULATION CONSULT NOTE - Follow Up Consult  Pharmacy Consult for heparin Indication: atrial fibrillation  No Known Allergies  Patient Measurements: Height: 6\' 1"  (185.4 cm) Weight: 224 lb 3.3 oz (101.7 kg) IBW/kg (Calculated) : 79.9 Heparin Dosing Weight: 102 kg  Vital Signs: Temp: 99.4 F (37.4 C) (03/02 1200) Temp Source: Oral (03/02 1200) BP: 106/70 (03/02 1700) Pulse Rate: 63 (03/02 1700)  Labs:  Recent Labs  09/17/16 0413 09/17/16 0415 09/17/16 1546 09/18/16 0439 09/18/16 0440 09/19/16 0420 09/19/16 1630  HGB 8.7*  --   --   --  7.8* 8.3*  --   HCT 28.3*  --   --   --  25.6* 27.8*  --   PLT 342  --   --   --  289 299  --   APTT  --   --  77*  --   --   --   --   LABPROT  --   --  15.7*  --   --   --   --   INR  --   --  1.25  --   --   --   --   HEPARINUNFRC  --  0.50  --   --  <0.10*  --  0.29*  CREATININE  --  1.50*  --  1.47*  --  1.42*  --     Estimated Creatinine Clearance: 59.8 mL/min (by C-G formula based on SCr of 1.42 mg/dL (H)).   Assessment: heparin for afib w/ CVA (goal 0.3-0.5). Hep gtt started 2/25 for afib. Heparin on hold for trach 3/1, and restarted this mroning. Heparin level 0.29, but only has been running for 6 hrs, no bolus given. Previously therapeutic on 1700 units/hr  Goal of Therapy:  goal 0.3-0.5 Monitor platelets by anticoagulation protocol: Yes   Plan:  Continue heparin at 1700 units/hr F/u AM heparin level  Bayard HuggerMei Tamar Lipscomb, PharmD, BCPS  Clinical Pharmacist  Pager: [phone]    09/19/2016,5:59 PM

## 2016-09-19 NOTE — Progress Notes (Addendum)
ANTIBIOTIC CONSULT NOTE   Pharmacy Consult for Cefepime + Heparin (restart) Indication: pneumonia  No Known Allergies  Patient Measurements: Height: 6\' 1"  (185.4 cm) Weight: 224 lb 3.3 oz (101.7 kg) IBW/kg (Calculated) : 79.9 Adjusted Body Weight:    Vital Signs: Temp: 98.5 F (36.9 C) (03/02 0800) Temp Source: Oral (03/02 0800) BP: 124/82 (03/02 0800) Pulse Rate: 93 (03/02 0800) Intake/Output from previous day: 03/01 0701 - 03/02 0700 In: 1469.9 [I.V.:706.6; NG/GT:613.3; IV Piggyback:150] Out: 1540 [Urine:1240; Stool:300] Intake/Output from this shift: Total I/O In: 74.4 [I.V.:24.4; NG/GT:50] Out: 100 [Urine:100]  Labs:  Recent Labs  09/17/16 0413 09/17/16 0415 09/18/16 0439 09/18/16 0440 09/19/16 0420  WBC 23.5*  --   --  18.0* 18.5*  HGB 8.7*  --   --  7.8* 8.3*  PLT 342  --   --  289 299  CREATININE  --  1.50* 1.47*  --  1.42*   Estimated Creatinine Clearance: 59.8 mL/min (by C-G formula based on SCr of 1.42 mg/dL (H)). No results for input(s): VANCOTROUGH, VANCOPEAK, VANCORANDOM, GENTTROUGH, GENTPEAK, GENTRANDOM, TOBRATROUGH, TOBRAPEAK, TOBRARND, AMIKACINPEAK, AMIKACINTROU, AMIKACIN in the last 72 hours.   Microbiology:   Medical History: Past Medical History:  Diagnosis Date  . C. difficile colitis 09/16/2016   Assessment:  Anticoag: heparin for afib w/ CVA (goal 0.3-0.5). Hep gtt started 2/25 for afib. Heparin on hold for trach 3/1. Per Dr. Jamison NeighborNestor - will consider restarting heparin 3/2 -last heparin level  was 0.5 on 1700/hr (2/28)- resume today  ID: cefepime D6/vanc po D3 for asp PNA/HCAP and C diff. s/p AAA repair 2/13 and back to OR 2/14 for abdominal closure. Re-intubated 2/26 Tmax 99.2, WBC 18.5 down, PCT= 0.32, Scr 1.42 declining slowly.  2/15 Vanc>>2/20; 2/25>> 2/28 2/17 VT = 15 on 750mg  q12 2/15 Zosyn>>2/22 2/22 Cefepime>>2/24; 2/25>>3/2 2/27 vanc po>>  2/27 resp: rare candida albicans 2/27 c diff- positive 2/25 blood x2 ngtd 2/23  MRSA PCR>> neg 2/14 resp>> normal floraF 2/15 blood x2>> ngF   Goal of Therapy:  Eradication of infection  Plan:  Cefepime 1g IV q8hr dose ok--d/c'd now -Resume IV heparin at 1700 units/hrs. Check level 6 hrs.   Tarin Navarez S. Merilynn Finlandobertson, PharmD, BCPS Clinical Staff Pharmacist Pager [phone]  Misty Stanleyobertson, Delquan Poucher Stillinger 09/19/2016,9:05 AM

## 2016-09-19 NOTE — Progress Notes (Signed)
Pt placed on ATC 40% in the AM and Dr. Celene SkeenNester said to place Pt on ATC for 2 hours and 6 hours on the ventilator. Placed Pt back on full support about 0900 AM. Speech asked for the Pt to be put  Put on ATC about 1425 and speech placed Pt on speaking valve. Pt will stay on speaking valve until he isnt able to handle it or gets to tired.RT will continue to monitor.

## 2016-09-19 NOTE — Evaluation (Signed)
[facility]y-[facility] Speaking Valve - Evaluation Patient Details  Name: Michael Heath MRN: [id number] Date of Birth: [birth year]  Today's Date: 09/19/2016 Time: 1415-1445 SLP Time Calculation (min) (ACUTE ONLY): 30 min  Past Medical History:  Past Medical History:  Diagnosis Date  . C. difficile colitis 09/16/2016   Past Surgical History:  Past Surgical History:  Procedure Laterality Date  . ABDOMINAL AORTIC ENDOVASCULAR STENT GRAFT N/A 09/05/2016   Procedure: ABDOMINAL AORTIC ENDOVASCULAR STENT GRAFT;  Surgeon: Nada Libman, MD;  Location: [facility] OR;  Service: Vascular;  Laterality: N/A;  . ABDOMINAL EXPOSURE  09/17/2016   Procedure: Abdominal Exploration;  Surgeon: Nada Libman, MD;  Location: [facility] OR;  Service: Vascular;;  . APPLICATION OF WOUND VAC  08/30/2016   Procedure: APPLICATION OF WOUND VAC;  Surgeon: Nada Libman, MD;  Location: [facility] OR;  Service: Vascular;;  . CATARACT EXTRACTION W/PHACO Right 03/2010  . CATARACT EXTRACTION W/PHACO Left 02/24/2013   Procedure: CATARACT EXTRACTION PHACO AND INTRAOCULAR LENS PLACEMENT (IOC);  Surgeon: Gemma Payor, MD;  Location: [facility] ORS;  Service: Ophthalmology;  Laterality: Left;  CDE: 13.82  . ESOPHAGOGASTRODUODENOSCOPY N/A 09/06/2016   Procedure: ESOPHAGOGASTRODUODENOSCOPY (EGD);  Surgeon: Napoleon Form, MD;  Location: [facility] ENDOSCOPY;  Service: Endoscopy;  Laterality: N/A;  . EYE SURGERY    . HEMATOMA EVACUATION  08/22/2016   Procedure: EVACUATION HEMATOMA;  Surgeon: Nada Libman, MD;  Location: [facility] OR;  Service: Vascular;;  . LAPAROTOMY N/A 09/02/2016   Procedure: EXPLORATORY LAPAROTOMY;  Surgeon: Nada Libman, MD;  Location: [facility] OR;  Service: Vascular;  Laterality: N/A;   HPI:  71 year old with past medical history of hypertension, hyperlipidemia, CKD, stage III. Admitted 08/23/2016 with ruptured AAA.; successful emergent stent placement but has had multiple complications including RPB, open abdominal wound, respiratory failure, requiring tracheostomy  placement and a hypotensive stroke v. Embolic Afib ischemic lesions. Intubated 2/13- 2/23; reintubated 2/26; trach 3/1. MRI 2/20 showed multifocal acute ischemia, including the left frontal watershed region. MBS on 09/13/16 revealed: "moderate oral and severe pharyngeal dysphagia with frank aspiration of thin liquids, penetration of honey-thick liquids and pureed solids with suspected aspiration of material mixed with secretions. Oral stage of swallowing noted for lingual pumping, weak lingual manipulation, delayed oral transit and retrograde movement of bolus from base of tongue to the oral cavity (puree). Swallow is delayed to the pyriform sinuses with thin, and after spillover the epiglottis with honey and pureed consistencies. Cervical osteophytes and protrusion of posterior pharyngeal wall in initial image. SLP noted during examination pt with unilateral posterior pharyngeal protrusion which impeded epiglottic deflection and airway closure, leading to severe residue in the valleculae and posterior pharyngeal wall with all consistencies (thin, honey-thick liquids and pureed), unilateral bolus flow, penetration/aspiration during and after the swallow. " Likely a chronic, premorbid dysphagia was per notes.    Assessment / Plan / Recommendation Clinical Impression  Pt presents with successful initial session using PMV.  Transitioned to ATC per RT, cuff deflated, suctioned.  VS stable.  Demonstrated adequate airway patency.  Valve placed for initial intervals of three-six breath cycles with no air trapping noted.  Required initial cues for oral exhalation and breathing practice prior to achieving voice.  Phonation initially weak; as session progressed, quality/volume of voice improved and intelligibility increased.  Wife and grandson present, and pt was enthusiastically engaging and talking with them.  VS remained stable.  For now, recommend using PMV during trach collar trials with cuff deflated; intermittent  supervision.  D/W RN and  Dr. Phillips OdorGolding.     SLP Visit Diagnosis: Aphonia (R49.1)    SLP Assessment  Patient needs continued Speech Lanaguage Pathology Services    Follow Up Recommendations   (tbd)    Frequency and Duration min 3x week  2 weeks    PMSV Trial PMSV was placed for: 40 minutes Able to redirect subglottic air through upper airway: Yes Able to Attain Phonation: Yes Voice Quality: Normal;Low vocal intensity Able to Expectorate Secretions: No attempts Breath Support for Phonation: Mildly decreased Intelligibility: Intelligible SpO2 During Trial: 96 % Pulse During Trial: 90 Behavior: Alert;Controlled;Cooperative;Good eye contact   Tracheostomy Tube  Additional Tracheostomy Tube Assessment Fenestrated: No; #6 Shiley with cuff deflated Trach Collar Period: just transitioned from vent to ATC per RT Level of Secretion Expectoration: Tracheal    Vent Dependency  Vent Dependent:  (weaning for two hours at a time) FiO2 (%): 40 %    Cuff Deflation Trial  GO Tolerated Cuff Deflation: Yes       Carolan ShiverCouture, Lashone Stauber Laurice 09/19/2016, 3:58 PM Vishruth Seoane L. Samson Fredericouture, [place] CCC/SLP Pager [phone]

## 2016-09-19 NOTE — Progress Notes (Signed)
[facility] FOR AM LAB REPLACEMENT ONLY  The patient does not apply [facility] the [facility] Protocol based on the criteria listed below:    Abnormal electrolyte(s):K3.6, Phos 2.2   If a panic level lab has been reported, has the CCM MD in charge been notified? Yes.  .   Physician:  Holland CommonsE Deterding, MD  Melrose NakayamaChisholm, Chanler Schreiter William 09/19/2016 6:20 AM

## 2016-09-19 NOTE — Progress Notes (Addendum)
AAA Progress Note    09/19/2016 8:53 AM 16 Days Post-Op  Subjective:  Awake and alert; answers questions. Denies abdominal pain.  Tm 99.2 now 98.8 HR 60's-100's NSR 90's-130's systolic 90% TC  Gtts: Propofol--off this am  Abx: Cefepime Vancomycin  Vitals:   09/19/16 0743 09/19/16 0800  BP: 119/78 124/82  Pulse: 92 93  Resp: (!) 21 (!) 37  Temp:      Physical Exam: Cardiac:  regular Abdomen:  Soft non tender Incisions:  Healing nicely.  No further drainage from left groin Extremities:  Easily palpable DP pulses bilaterally;  Neuro:  Following commands; hand grip on right is stronger than last week.  Moving all other extremities.  CBC    Component Value Date/Time   WBC 18.5 (H) 09/19/2016 0420   RBC 2.76 (L) 09/19/2016 0420   HGB 8.3 (L) 09/19/2016 0420   HCT 27.8 (L) 09/19/2016 0420   HCT 47.6 05/01/2016 1001   PLT 299 09/19/2016 0420   PLT 230 05/01/2016 1001   MCV 100.7 (H) 09/19/2016 0420   MCV 93 05/01/2016 1001   MCH 30.1 09/19/2016 0420   MCHC 29.9 (L) 09/19/2016 0420   RDW 20.5 (H) 09/19/2016 0420   RDW 13.0 05/01/2016 1001   LYMPHSABS 1.5 09/19/2016 0420   LYMPHSABS 2.6 05/01/2016 1001   MONOABS 1.5 (H) 09/19/2016 0420   EOSABS 0.7 09/19/2016 0420   EOSABS 0.6 (H) 05/01/2016 1001   BASOSABS 0.2 (H) 09/19/2016 0420   BASOSABS 0.1 05/01/2016 1001    BMET    Component Value Date/Time   NA 144 09/19/2016 0420   NA 134 08/25/2016 1335   K 3.6 09/19/2016 0420   CL 119 (H) 09/19/2016 0420   CO2 21 (L) 09/19/2016 0420   GLUCOSE 117 (H) 09/19/2016 0420   BUN 38 (H) 09/19/2016 0420   BUN 26 08/25/2016 1335   CREATININE 1.42 (H) 09/19/2016 0420   CALCIUM 8.2 (L) 09/19/2016 0420   GFRNONAA 48 (L) 09/19/2016 0420   GFRAA 56 (L) 09/19/2016 0420    INR    Component Value Date/Time   INR 1.25 09/17/2016 1546     Intake/Output Summary (Last 24 hours) at 09/19/16 0853 Last data filed at 09/19/16 0800  Gross per 24 hour  Intake           1514.14 ml  Output             1440 ml  Net            74.14 ml     Assessment/Plan:  71 y.o. male is s/p  #1: Endovascular repair of ruptured abdominal aortic aneurysm #2: Ultrasound-guided access, bilateral common femoral artery #3: Abdominal aortogram #4: Catheter aorta 2 #5: Bilateral iliac extension #6: Exploratory laparotomy #7: Evacuation of abdominal retroperitoneal hematoma #8: Placement of abdominal wound VAC 17 Days Post-Op and #1:  Re-opening of recent laparotomy ( removal of abdominal wound vac) #2:  Abdominal exploration #3:  Abdominal wall closure   Pulmonary:  pt now with trach and tolerating TC (rotating 6hr and 2hr on TC/vent) Cardiac:  no further Afib-NSR; heparin to restart per CCM; amio per cards with taper neuro-awake, following commands; right hand grip improved Renal-renal function stable Heme-anemia is stable ID:  C diff--Vanc per tube GI:  TF @ 50cc/hr -PT worked with pt yesterday and sat on side of the bed and did well--continue PT/OT   [name], PA-C [facility] [phone] 09/19/2016 8:53 AM  Agree with the above.  I have seen and examined the patient.  Appreciate Palliative care assistance Look for LTAC places Trach trials Remains on AbX Heparin will need to be transitioned to oral medication (NOAC vs Coumadin) May need PEG if fails repeat swallow study   Durene CalWells Juliah Scadden

## 2016-09-19 NOTE — Consult Note (Signed)
Consultation Note Date: 09/19/2016   Patient Name: Michael Heath  DOB: 09/27/1945  MRN: [id number]  Age / Sex: 71 y.o., male  PCP: Timmothy Euler, MD Referring Physician: Serafina Mitchell, MD  Reason for Consultation: Establishing goals of care, Non pain symptom management, Pain control and Psychosocial/spiritual support  HPI/Patient Profile: 71 y.o. male  Admitted on 2/13 with a ruptured AAA and underwent successful emergent stent placement but has had multiple complications including RPB, open abdominal wound, respiratory failure, requiring tracheostomy placement and a hypotensive stroke v. Embolic Afib ischemic lesions.   Clinical Assessment and Goals of Care:  I met [name] today and introduced myself and palliative care. Unfortunately I missed his wife who was at bedside earlier today. Currently is seems he has made significant improvement given his pre-existing chronic disease burden as well as defying the odds in terms of surviving such a dramatic bleed with life threatening complications. For now his respiratory status is stable with tracheostomy placement. Mentally he is VERY appropriate, he is able to communicate with me very well by mouthing words and gestures- he was able to tell me he was thirsty, that his trach site was hurting and also acknowledged by nodding that he knew what had happened to him-he actually showed me the universal sign for fighter and strong by raising his left arm to show me his biceps. I acknowledged his suffering and provided empathy for what he has been through and offered cautious optimism about his ability to recover- perhaps not completely back to his baseline but there is potential to come fairly close provided he continues to improve and other complications do not arise.   He was fairly clear that his goal was to get as best he could be as soon as possible. He is a limited  code which is reasonable- CPR would be medically inadvisable in his condition and post-op state.  I will reach out to his wife Monday next week, and if his condition changes we can meet with her sooner.    SUMMARY OF RECOMMENDATIONS    1. Scheduled TID Tylenol per tube for his low level Tracheostomy site Pain and generalized discomfort- should have few if any side effects and some patients do very well with Tylenol alone for pain when it is scheduled-he has IV fentanyl PRN  2. Try to avoid benzodiazepines unless agitation becomes severe and if used use low dose short acting options.  3. Continue to mobilize him as much as possible to preserve his function  4. I re-consulted SLP- communication will be key in his recovery and good for his mental health as well-possible PMV and he needs a repeat swallow eval- his mouth is very dry and he is requesting water to drink. I examined the inside of his mouth and he may have some areas that look like thrush and could be come painful-I will order MMW.   5. Spiritual Care consult placed for additional support.   Code Status/Advance Care Planning:  Limited code  Palliative Prophylaxis:  Aspiration, Bowel Regimen, Delirium Protocol, Eye Care, Frequent Pain Assessment, Oral Care and Turn Reposition  Additional Recommendations (Limitations, Scope, Preferences):  Full Scope Treatment  Psycho-social/Spiritual:   Desire for further Chaplaincy support:yes  Additional Recommendations: Caregiving  Support/Resources and Referral to Community Resources   Prognosis:   Unable to determine  Discharge Planning: To Be Determined      Primary Diagnoses: Present on Admission: . Ruptured abdominal aortic aneurysm (AAA) ([facility])   I have reviewed the medical record, interviewed the patient and family, and examined the patient. The following aspects are pertinent.  Past Medical History:  Diagnosis Date  . C. difficile colitis 09/16/2016   Social  History   Social History  . Marital status: Married    Spouse name: N/A  . Number of children: N/A  . Years of education: N/A   Social History Main Topics  . Smoking status: Current Every Day Smoker    Packs/day: 1.50    Years: 55.00    Types: Cigarettes  . Smokeless tobacco: Current User  . Alcohol use No  . Drug use: No  . Sexual activity: Not Asked   Other Topics Concern  . None   Social History Narrative  . None   Family History  Problem Relation Age of Onset  . Hypertension Father    Scheduled Meds: . acetaminophen  650 mg Per Tube TID  . [START ON 10/24/2016] amiodarone  200 mg Per Tube Daily  . amiodarone  400 mg Per NG tube Daily  . chlorhexidine gluconate (MEDLINE KIT)  15 mL Mouth Rinse BID  . Chlorhexidine Gluconate Cloth  6 each Topical Daily  . famotidine  20 mg Per Tube BID  . feeding supplement (PRO-STAT SUGAR FREE 64)  60 mL Per Tube BID  . fentaNYL (SUBLIMAZE) injection  200 mcg Intravenous Once  . mouth rinse  15 mL Mouth Rinse QID  . metoprolol tartrate  12.5 mg Per NG tube BID  . midazolam  4 mg Intravenous Once  . sodium chloride flush  10-40 mL Intracatheter Q12H  . vancomycin  125 mg Oral Q6H   Continuous Infusions: . sodium chloride 10 mL/hr at 09/19/16 0909  . dexmedetomidine 0.6 mcg/kg/hr (09/19/16 1150)  . feeding supplement (VITAL 1.5 CAL) 1,000 mL (09/18/16 1744)  . heparin 1,700 Units/hr (09/19/16 1046)   PRN Meds:.albuterol, artificial tears, bisacodyl, fentaNYL (SUBLIMAZE) injection, haloperidol lactate, hydrALAZINE, labetalol, midazolam, ondansetron, sodium chloride flush Medications Prior to Admission:  Prior to Admission medications   Medication Sig Start Date End Date Taking? Authorizing Provider  atorvastatin (LIPITOR) 20 MG tablet Take 1 tablet (20 mg total) by mouth daily. 01/28/16  Yes Timmothy Euler, MD  HYDROcodone-homatropine [facility]) 5-1.5 MG/5ML syrup Take 5 mLs by mouth every 6 (six) hours as needed for cough.  09/01/16  Yes Timmothy Euler, MD  levofloxacin (LEVAQUIN) 500 MG tablet Take 500 mg by mouth daily.  08/25/16  Yes Historical Provider, MD  Vitamin D, Ergocalciferol, (DRISDOL) 50000 units CAPS capsule Take 1 capsule (50,000 Units total) by mouth every 7 (seven) days. 08/25/16  Yes Timmothy Euler, MD  aspirin EC 81 MG tablet Take 1 tablet (81 mg total) by mouth daily. 01/28/16   Timmothy Euler, MD   No Known Allergies Review of Systems  Physical Exam  Vital Signs: BP 103/63 (BP Location: Right Leg)   Pulse 70   Temp 99.4 F (37.4 C) (Oral)   Resp (!) 24   Ht _0  (1.854 m)  Wt 101.7 kg (224 lb 3.3 oz)   SpO2 97%   BMI 29.58 kg/m  Pain Assessment: CPOT POSS *See Group Information*: S-Acceptable,Sleep, easy to arouse Pain Score: 0-No pain   SpO2: SpO2: 97 % O2 Device:SpO2: 97 % O2 Flow Rate: .O2 Flow Rate (L/min): 8 L/min  IO: Intake/output summary:  Intake/Output Summary (Last 24 hours) at 09/19/16 1321 Last data filed at 09/19/16 1200  Gross per 24 hour  Intake          1582.09 ml  Output             1535 ml  Net            47.09 ml    LBM: Last BM Date: 09/19/16 Baseline Weight: Weight: 100.7 kg (222 lb) Most recent weight: Weight: 101.7 kg (224 lb 3.3 oz)     Palliative Assessment/Data:   Flowsheet Rows   Flowsheet Row Most Recent Value  Intake Tab  Referral Department  Surgery  Unit at Time of Referral  ICU  Palliative Care Primary Diagnosis  Other (Comment) [AAA]  Date Notified  09/19/16  Palliative Care Type  New Palliative care  Reason for referral  Clarify Goals of Care  Date of Admission  08/25/2016  Date first seen by Palliative Care  09/19/16  # of days IP prior to Palliative referral  17  Clinical Assessment  Palliative Performance Scale Score  20%  Pain Max last 24 hours  8  Pain Min Last 24 hours  0  Dyspnea Max Last 24 Hours  8  Dyspnea Min Last 24 hours  0  Nausea Max Last 24 Hours  0  Nausea Min Last 24 Hours  0  Anxiety Max Last 24  Hours  5  Anxiety Min Last 24 Hours  5  Psychosocial & Spiritual Assessment  Palliative Care Outcomes  Patient/Family meeting held?  No  Palliative Care Outcomes  Improved pain interventions, Provided psychosocial or spiritual support       Time Total: 50 minutes Greater than 50%  of this time was spent counseling and coordinating care related to the above assessment and plan.  Signed by: Lane Hacker, DO   Please contact Palliative Medicine Team phone at [phone] for questions and concerns.  For individual provider: See Shea Evans

## 2016-09-19 NOTE — Evaluation (Signed)
Clinical/Bedside Swallow Evaluation Patient Details  Name: Michael Heath MRN: [id number] Date of Birth: Jan 12, 1946  Today's Date: 09/19/2016 Time: SLP Start Time (ACUTE ONLY): 1445 SLP Stop Time (ACUTE ONLY): 1510 SLP Time Calculation (min) (ACUTE ONLY): 25 min  Past Medical History:  Past Medical History:  Diagnosis Date  . C. difficile colitis 09/16/2016   Past Surgical History:  Past Surgical History:  Procedure Laterality Date  . ABDOMINAL AORTIC ENDOVASCULAR STENT GRAFT N/A 09/03/2016   Procedure: ABDOMINAL AORTIC ENDOVASCULAR STENT GRAFT;  Surgeon: Nada Libman, MD;  Location: [facility] OR;  Service: Vascular;  Laterality: N/A;  . ABDOMINAL EXPOSURE  09/07/2016   Procedure: Abdominal Exploration;  Surgeon: Nada Libman, MD;  Location: [facility] OR;  Service: Vascular;;  . APPLICATION OF WOUND VAC  09/17/2016   Procedure: APPLICATION OF WOUND VAC;  Surgeon: Nada Libman, MD;  Location: [facility] OR;  Service: Vascular;;  . CATARACT EXTRACTION W/PHACO Right 03/2010  . CATARACT EXTRACTION W/PHACO Left 02/24/2013   Procedure: CATARACT EXTRACTION PHACO AND INTRAOCULAR LENS PLACEMENT (IOC);  Surgeon: Gemma Payor, MD;  Location: [facility] ORS;  Service: Ophthalmology;  Laterality: Left;  CDE: 13.82  . ESOPHAGOGASTRODUODENOSCOPY N/A 2016-09-21   Procedure: ESOPHAGOGASTRODUODENOSCOPY (EGD);  Surgeon: Napoleon Form, MD;  Location: [facility] ENDOSCOPY;  Service: Endoscopy;  Laterality: N/A;  . EYE SURGERY    . HEMATOMA EVACUATION  08/21/2016   Procedure: EVACUATION HEMATOMA;  Surgeon: Nada Libman, MD;  Location: [facility] OR;  Service: Vascular;;  . LAPAROTOMY N/A 21-Sep-2016   Procedure: EXPLORATORY LAPAROTOMY;  Surgeon: Nada Libman, MD;  Location: [facility] OR;  Service: Vascular;  Laterality: N/A;   HPI:  71 year old with past medical history of hypertension, hyperlipidemia, CKD, stage III. Admitted 09/07/2016 with ruptured AAA.; successful emergent stent placement but has had multiple complications including RPB, open  abdominal wound, respiratory failure, requiring tracheostomy placement and a hypotensive stroke v. Embolic Afib ischemic lesions. Intubated 2/13- 2/23; reintubated 2/26; trach 3/1. MRI 2/20 showed multifocal acute ischemia, including the left frontal watershed region. MBS on 09/13/16 revealed: "moderate oral and severe pharyngeal dysphagia with frank aspiration of thin liquids, penetration of honey-thick liquids and pureed solids with suspected aspiration of material mixed with secretions. Oral stage of swallowing noted for lingual pumping, weak lingual manipulation, delayed oral transit and retrograde movement of bolus from base of tongue to the oral cavity (puree). Swallow is delayed to the pyriform sinuses with thin, and after spillover the epiglottis with honey and pureed consistencies. Cervical osteophytes and protrusion of posterior pharyngeal wall in initial image. SLP noted during examination pt with unilateral posterior pharyngeal protrusion which impeded epiglottic deflection and airway closure, leading to severe residue in the valleculae and posterior pharyngeal wall with all consistencies (thin, honey-thick liquids and pureed), unilateral bolus flow, penetration/aspiration during and after the swallow. " Likely a chronic, premorbid dysphagia was per notes.    Assessment / Plan / Recommendation Clinical Impression  Requested by Palliative medicine for repeat swallow evaluation; has orders for occasional ice chips.  Pt eager to eat.  Tolerated PMV well during initial session.  With PMV in place, provided oral care.  Oral motor exam with no focal deficits.  Significant dysphagia diagnosed per 2/24 MBS, prior to reintubation and subsequent trach.  Pt does present with overt coughing/wet phonation post ice chip trials.  Aspiration risk remains high. Given pt's discussion with Dr. Phillips Odor, recommend provision of ice chips after oral care, and when pt is on ATC with PMV in place to  faciliate subglottic  pressure for cough.  Recommend FEES early next week (3/5) to determine status and focus for treatment.   SLP Visit Diagnosis: Dysphagia, oropharyngeal phase (R13.12)    Aspiration Risk  Severe aspiration risk    Diet Recommendation     Medication Administration: Via alternative means    Other  Recommendations Oral Care Recommendations: Oral care QID   Follow up Recommendations  (tbd)      Frequency and Duration min 3x week  2 weeks       Prognosis Prognosis for Safe Diet Advancement: Guarded      Swallow Study   General Date of Onset: 09/15/2016 HPI:  71 year old with past medical history of hypertension, hyperlipidemia, CKD, stage III. Admitted 09/12/2016 with ruptured AAA.; successful emergent stent placement but has had multiple complications including RPB, open abdominal wound, respiratory failure, requiring tracheostomy placement and a hypotensive stroke v. Embolic Afib ischemic lesions. Intubated 2/13- 2/23; reintubated 2/26; trach 3/1. MRI 2/20 showed multifocal acute ischemia, including the left frontal watershed region. MBS on 09/13/16 revealed: "moderate oral and severe pharyngeal dysphagia with frank aspiration of thin liquids, penetration of honey-thick liquids and pureed solids with suspected aspiration of material mixed with secretions. Oral stage of swallowing noted for lingual pumping, weak lingual manipulation, delayed oral transit and retrograde movement of bolus from base of tongue to the oral cavity (puree). Swallow is delayed to the pyriform sinuses with thin, and after spillover the epiglottis with honey and pureed consistencies. Cervical osteophytes and protrusion of posterior pharyngeal wall in initial image. SLP noted during examination pt with unilateral posterior pharyngeal protrusion which impeded epiglottic deflection and airway closure, leading to severe residue in the valleculae and posterior pharyngeal wall with all consistencies (thin, honey-thick liquids and  pureed), unilateral bolus flow, penetration/aspiration during and after the swallow. " Likely a chronic, premorbid dysphagia was per notes.  Type of Study: Bedside Swallow Evaluation Previous Swallow Assessment: see HPI Diet Prior to this Study: NPO Temperature Spikes Noted: Yes Respiratory Status: Trach;Trach Collar Trach Size and Type: #6;Cuff;Deflated;With PMSV in place History of Recent Intubation: Yes Length of Intubations (days):  (ETT x2; trach 3/1) Behavior/Cognition: Alert;Cooperative Oral Cavity Assessment: Within Functional Limits Oral Care Completed by SLP: Yes Oral Cavity - Dentition: Adequate natural dentition Vision: Functional for self-feeding Self-Feeding Abilities: Total assist Patient Positioning: Upright in bed Baseline Vocal Quality: Low vocal intensity (with PMV in place) Volitional Cough: Weak;Congested Volitional Swallow: Able to elicit    Oral/Motor/Sensory Function Overall Oral Motor/Sensory Function: Within functional limits   Ice Chips Ice chips: Impaired Presentation: Spoon Pharyngeal Phase Impairments: Suspected delayed Swallow;Multiple swallows;Cough - Delayed   Thin Liquid Thin Liquid: Not tested    Nectar Thick Nectar Thick Liquid: Not tested   Honey Thick Honey Thick Liquid: Not tested   Puree Puree: Not tested   Solid   GO   Solid: Not tested        Blenda Mountsouture, Chanci Ojala Laurice 09/19/2016,4:12 PM

## 2016-09-19 NOTE — Progress Notes (Signed)
As was responding to consult for palliative for this pt, was paged to ED for incoming trauma pt. Spoke to nurse [id number]:time-sensitive nature of her palliative request for this pt. She said it was by no means that urgent, he was improving, and someone could come at another time. Will pass request on to next chaplain for f/u.   09/19/16 1500  Clinical Encounter Type  Visited With Health care provider  Visit Type Initial;Psychological support;Spiritual support;Social support  Referral From Nurse   Michael Heath, Chaplain

## 2016-09-20 DIAGNOSIS — Z930 Tracheostomy status: Secondary | ICD-10-CM

## 2016-09-20 LAB — GLUCOSE, CAPILLARY
GLUCOSE-CAPILLARY: 121 mg/dL — AB (ref 65–99)
GLUCOSE-CAPILLARY: 136 mg/dL — AB (ref 65–99)
GLUCOSE-CAPILLARY: 139 mg/dL — AB (ref 65–99)
Glucose-Capillary: 121 mg/dL — ABNORMAL HIGH (ref 65–99)
Glucose-Capillary: 118 mg/dL — ABNORMAL HIGH (ref 65–99)

## 2016-09-20 LAB — CBC WITH DIFFERENTIAL/PLATELET
Basophils Absolute: 0.2 10*3/uL — ABNORMAL HIGH (ref 0.0–0.1)
Basophils Relative: 1 %
EOS PCT: 3 %
Eosinophils Absolute: 0.5 10*3/uL (ref 0.0–0.7)
HEMATOCRIT: 27.4 % — AB (ref 39.0–52.0)
Hemoglobin: 8.2 g/dL — ABNORMAL LOW (ref 13.0–17.0)
LYMPHS ABS: 1.8 10*3/uL (ref 0.7–4.0)
Lymphocytes Relative: 10 %
MCH: 30.5 pg (ref 26.0–34.0)
MCHC: 29.9 g/dL — AB (ref 30.0–36.0)
MCV: 101.9 fL — ABNORMAL HIGH (ref 78.0–100.0)
MONO ABS: 1.9 10*3/uL — AB (ref 0.1–1.0)
Monocytes Relative: 11 %
NEUTROS ABS: 13.1 10*3/uL — AB (ref 1.7–7.7)
Neutrophils Relative %: 75 %
Platelets: 271 10*3/uL (ref 150–400)
RBC: 2.69 MIL/uL — AB (ref 4.22–5.81)
RDW: 20.7 % — AB (ref 11.5–15.5)
WBC: 17.5 10*3/uL — AB (ref 4.0–10.5)

## 2016-09-20 LAB — HEPARIN LEVEL (UNFRACTIONATED)
HEPARIN UNFRACTIONATED: 0.26 [IU]/mL — AB (ref 0.30–0.70)
Heparin Unfractionated: 0.64 IU/mL (ref 0.30–0.70)
Heparin Unfractionated: 0.55 IU/mL (ref 0.30–0.70)

## 2016-09-20 LAB — RENAL FUNCTION PANEL
ALBUMIN: 1.8 g/dL — AB (ref 3.5–5.0)
Anion gap: 5 (ref 5–15)
BUN: 45 mg/dL — AB (ref 6–20)
CHLORIDE: 118 mmol/L — AB (ref 101–111)
CO2: 22 mmol/L (ref 22–32)
CREATININE: 1.31 mg/dL — AB (ref 0.61–1.24)
Calcium: 8.3 mg/dL — ABNORMAL LOW (ref 8.9–10.3)
GFR calc Af Amer: >60 mL/min (ref >60)
GFR calc non Af Amer: 53 mL/min — ABNORMAL LOW (ref >60)
Glucose, Bld: 123 mg/dL — ABNORMAL HIGH (ref 65–99)
PHOSPHORUS: 2.9 mg/dL (ref 2.5–4.6)
Potassium: 3.4 mmol/L — ABNORMAL LOW (ref 3.5–5.1)
Sodium: 145 mmol/L (ref 135–145)

## 2016-09-20 LAB — MAGNESIUM: Magnesium: 2.3 mg/dL (ref 1.7–2.4)

## 2016-09-20 MED ORDER — HYDROMORPHONE HCL 1 MG/ML IJ SOLN: 0.2500 mg | INTRAMUSCULAR | Status: DC | PRN

## 2016-09-20 MED ORDER — PROMETHAZINE HCL 25 MG/ML IJ SOLN: 6.2500 mg | INTRAMUSCULAR | Status: DC | PRN

## 2016-09-20 MED ORDER — POTASSIUM CHLORIDE 20 MEQ/15ML (10%) PO SOLN
40.0000 meq | Freq: Once | ORAL | Status: AC
  Administered 2016-09-20: 40 meq
  Filled 2016-09-20: qty 30

## 2016-09-20 NOTE — Progress Notes (Signed)
ANTICOAGULATION CONSULT NOTE - Follow Up Consult  Pharmacy Consult for Heparin Indication: atrial fibrillation and CVA    No Known Allergies  Patient Measurements: Height: 6\' 1"  (185.4 cm) Weight: 225 lb 8.5 oz (102.3 kg) IBW/kg (Calculated) : 79.9 Heparin Dosing Weight:   Vital Signs: Temp: 97.8 F (36.6 C) (03/03 2030) Temp Source: Oral (03/03 2030) BP: 137/67 (03/03 2200) Pulse Rate: 60 (03/03 2200)  Labs:  Recent Labs  09/18/16 0439  09/18/16 0440 09/19/16 0420  09/20/16 0358 09/20/16 1407 09/20/16 2132  HGB  --   < > 7.8* 8.3*  --  8.2*  --   --   HCT  --   --  25.6* 27.8*  --  27.4*  --   --   PLT  --   --  289 299  --  271  --   --   HEPARINUNFRC  --   --  <0.10*  --   < > 0.26* 0.64 0.55  CREATININE 1.47*  --   --  1.42*  --  1.31*  --   --   < > = values in this interval not displayed.  Estimated Creatinine Clearance: 65 mL/min (by C-G formula based on SCr of 1.31 mg/dL (H)).  Assessment:  Anticoag: heparin for afib w/ CVA (goal 0.3-0.5). Hep gtt started 2/25 for afib. Heparin on hold for trach 3/1; Resumed 3/2. HL 0.26>0.64>0.55 remains slightly above goal range.  Goal of Therapy:  Heparin level 0.3-0.5 units/ml Monitor platelets by anticoagulation protocol: Yes   Plan:  -Heparin 1550 units/hr decrease - Daily HL, CBC    Nial Hawe S. Merilynn Finlandobertson, PharmD, BCPS Clinical Staff Pharmacist Pager [phone]  Misty Stanleyobertson, Ryn Peine Stillinger 09/20/2016,10:09 PM

## 2016-09-20 NOTE — Progress Notes (Signed)
ANTICOAGULATION CONSULT NOTE - Follow Up Consult  Pharmacy Consult for Heparin Indication: atrial fibrillation and CVA    No Known Allergies  Patient Measurements: Height: 6\' 1"  (185.4 cm) Weight: 225 lb 8.5 oz (102.3 kg) IBW/kg (Calculated) : 79.9 Heparin Dosing Weight:   Vital Signs: Temp: 98 F (36.7 C) (03/03 1238) Temp Source: Oral (03/03 1238) BP: 111/56 (03/03 1400) Pulse Rate: 58 (03/03 1400)  Labs:  Recent Labs  09/17/16 1546 09/18/16 0439  09/18/16 0440 09/19/16 0420 09/19/16 1630 09/20/16 0358 09/20/16 1407  HGB  --   --   < > 7.8* 8.3*  --  8.2*  --   HCT  --   --   --  25.6* 27.8*  --  27.4*  --   PLT  --   --   --  289 299  --  271  --   APTT 77*  --   --   --   --   --   --   --   LABPROT 15.7*  --   --   --   --   --   --   --   INR 1.25  --   --   --   --   --   --   --   HEPARINUNFRC  --   --   < > <0.10*  --  0.29* 0.26* 0.64  CREATININE  --  1.47*  --   --  1.42*  --  1.31*  --   < > = values in this interval not displayed.  Estimated Creatinine Clearance: 65 mL/min (by C-G formula based on SCr of 1.31 mg/dL (H)).  Assessment:  Anticoag: heparin for afib w/ CVA (goal 0.3-0.5). Hep gtt started 2/25 for afib. Heparin on hold for trach 3/1; Resumed 3/2. HL 0.26>0.64 slightly above goal range.  Goal of Therapy:  Heparin level 0.3-0.5 units/ml Monitor platelets by anticoagulation protocol: Yes   Plan:  -Heparin 1700 units/hr decrease - Repeat HL 6 hrs later.    Michael Heath, Michael Heath, Michael Heath Clinical Staff Pharmacist Pager [phone]  Misty Stanleyobertson, Cambell Stanek Stillinger 09/20/2016,3:02 PM

## 2016-09-20 NOTE — Progress Notes (Signed)
[facility] Attending Note  Presenting HPI:  71 y.o. male with past medical history of hypertension, hyperlipidemia, CKD, stage III. Admitted 2/13 with right flank, back pain. A CTA which showed ruptured AAA. He was transferred emergently to [facility] for stent repair.,required massive tr ansfusion , developedabdominal comparment syndrome in OR from massive RP bleed. COurse complicated by  open abdomen, prolonged ventilation, tstomy & acute CVA ?embolic  IMAGING/STUDIES: CTA CHEST/ABD/PELVIS 2/13:  Acutely ruptured 7.8 cm infrarenal abdominal aortic aneurysm with a large right retroperitoneal hematoma. Moderate pulmonary emphysema. Small hiatal hernia. Incidental renal cysts. Colonic diverticulosis. Cholelithiasis. TTE 2/15: LV normal in size with mild concentric hypertrophy. EF 60-65% with normal regional wall motion. Grade 1 diastolic dysfunction without evidence of elevated filling pressure. LA mildly dilated & RA normal in size. RV normal in size and function. No aortic stenosis or regurgitation. Aortic root normal in size. No mitral stenosis or regurgitation. Pulmonic valve poorly visualized but without stenosis. Trivial tricuspid regurgitation. No pericardial effusion. MRI BRAIN W/O 2/20:  Multifocal acute ischemia, including the left frontal watershed region, possibly secondary to an acute hypotensive episode. The other scattered punctate foci of ischemia are more suggestive of a central cardio embolic process. No acute hemorrhage or mass effect. MRI C-SPINE 2/20:  C5-C6 disc osteophyte complex causing severe spinal canal stenosis and severe left, moderate right foraminal narrowing. Mild narrowing at the right C4 and left C5 neural foramina. VENOUS DUPLEX RUE 2/20:  Findings consistent with acute superficial vein thrombosis involving the right cephalic vein. No evidence of deep vein thrombosis involving the right upper extremity. No evidence of deep vein or superficial thrombosis  involving the left upper extremity. BILATERAL CAROTID U/S 2/21:  The vertebral arteries appear patent with antegrade flow. Findings consistent with a 1- 39% stenosis involving the. right internal carotid artery and the left internal carotid artery.  MICROBIOLOGY: MRSA PCR 2/13: Negative Blood Cultures x2 2/15:  Negative  Tracheal Aspirate Culture 2/14: Normal respiratory flora Blood Cultures x2 2/25 >>>ng Tracheal Aspirate Culture 2/27:  Rare Candida albicans Stool C Diff 2/27:  Toxin & Antigen Positive  ANTIBIOTICS: Zinacef 2/14 (periop prophylaxis) Zosyn 2/15 - 2/22 Vancomycin 2/15 - 2/28 Flagyl via tube 2/27 - 2/28 Cefepime 2/22 - 3/2 Vancomycin PO 2/28 >>>  SIGNIFICANT EVENTS: 2/13 - Admit, OR for ruptured AAA. 2/14 - Back to OR for abdomina closure.  2/20 - Found to have superficial venous thrombus right upper extremity 2/21 - A fib w/ RVR started on Diltiazem 2/23 - Extubated to BiPAP 2/25 - Started on Amiodarone drip after bolus for A fib w/ RVR. Febrile>>recultured 2/28 - Transitioned from Amiodarone drip to Amiodarone via tube 3/01 - Trach placed by JY at bedside holding heparin drip for 12 hours prior 3/2 trach collar, PMV  Subjective:    Denies pain, afebrile UO ok Able to communicate with signals  Vent Mode: PRVC FiO2 (%):  [40 %] 40 % Set Rate:  [22 bmp] 22 bmp Vt Set:  [640 mL] 640 mL PEEP:  [5 cmH20] 5 cmH20 Pressure Support:  [5 cmH20] 5 cmH20 Plateau Pressure:  [13 cmH20-15 cmH20] 15 cmH20  Temp:  [98.5 F (36.9 C)-99.4 F (37.4 C)] 98.6 F (37 C) (03/03 0325) Pulse Rate:  [47-99] 47 (03/03 0700) Resp:  [16-37] 24 (03/03 0700) BP: (99-132)/(60-88) 105/60 (03/03 0700) SpO2:  [88 %-100 %] 97 % (03/03 0700) FiO2 (%):  [40 %] 40 % (03/03 0700) Weight:  [225 lb 8.5 oz (102.3 kg)] 225  lb 8.5 oz (102.3 kg) (03/03 0500)   Gen.: Awake. No distress. Chr ill Integument: Warm and dry. No rash on exposed skin. No bleeding around tracheostomy  stoma. HEENT: Moist mucous membranes. #6 cuffed tracheostomy in place. No scleral icterus. Cardiovascular: Regular rate and rhythm. Sinus rhythm on telemetry. Persistent mild anasarca.  Pulmonary: Overall clear with auscultation. Normal work of breathing with tracheostomy on ventilator. Abdomen: Soft. Protuberant. Normal bowel sounds. Neurological: Following commands. Moving all 4 extremities. RUE 4/5  LINES/TUBES: OGT 2/13 - 2/23 R IJ CORDIS 2/13 - Out (? Date) OETT 8.0 2/13 - 2/23; 8.0 2/26 - 3/1 Perc Trach #6 Cuffed (JY) 3/1 >>> LUE DL PICC 2/20 >>> NGT 2/26 >>> Foley 2/13 >>> PIV  CBC Latest Ref Rng & Units 09/20/2016 09/19/2016 09/18/2016  WBC 4.0 - 10.5 K/uL 17.5(H) 18.5(H) 18.0(H)  Hemoglobin 13.0 - 17.0 g/dL 8.2(L) 8.3(L) 7.8(L)  Hematocrit 39.0 - 52.0 % 27.4(L) 27.8(L) 25.6(L)  Platelets 150 - 400 K/uL 271 299 289    BMP Latest Ref Rng & Units 09/20/2016 09/19/2016 09/18/2016  Glucose 65 - 99 mg/dL 123(H) 117(H) 120(H)  BUN 6 - 20 mg/dL 45(H) 38(H) 38(H)  Creatinine 0.61 - 1.24 mg/dL 1.31(H) 1.42(H) 1.47(H)  BUN/Creat Ratio 10 - 24 - - -  Sodium 135 - 145 mmol/L 145 144 148(H)  Potassium 3.5 - 5.1 mmol/L 3.4(L) 3.6 3.3(L)  Chloride 101 - 111 mmol/L 118(H) 119(H) 119(H)  CO2 22 - 32 mmol/L 22 21(L) 25  Calcium 8.9 - 10.3 mg/dL 8.3(L) 8.2(L) 7.7(L)   Hepatic Function Latest Ref Rng & Units 09/20/2016 09/19/2016 09/18/2016  Total Protein 6.5 - 8.1 g/dL - - -  Albumin 3.5 - 5.0 g/dL 1.8(L) 1.8(L) 1.7(L)  AST 15 - 41 U/L - - -  ALT 17 - 63 U/L - - -  Alk Phosphatase 38 - 126 U/L - - -  Total Bilirubin 0.3 - 1.2 mg/dL - - -  Bilirubin, Direct 0.1 - 0.5 mg/dL - - -      ASSESSMENT/PLAN:  Making progress on ATC , hopeful to liberate him from vent    1. Acute hypoxic respiratory failure: ATC daytime as tolerated -vent back at night 2. Atrial fibrillation: Currently nSR. Ct amiodarone per cardiology recommendations. Resumedheparin drip. 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 Advance PM valve per speech. Continuing PT/OT. 4. Sepsis: Secondary to C. difficile colitis. Off IV ABx, PO vanc only 5. C. difficile colitis: Currently on day #3 of oral vancomycin. Continuing enteric precautions. 6. Possible healthcare associated pneumonia: No evidence of infection on repeat tracheostomy culture. Plan to repeat culture for increased secretions or fever. 7. Abdominal aortic aneurysm rupture: Status post repair. Postoperative management per vascular surgery. 8. Abdominal compartment syndrome: Resolved. Status post abdominal closure. 9. Anemia: Hemoglobin stable. No evidence of active bleeding. Trending cell counts daily with CBC while on systemic anticoagulation. 10. Acute renal failure: Stable. Continuing to trend urine output with Foley catheter. Monitoring electrolytes and renal function daily.replete  k 11. Prolonged QTc: Monitoringon telemetry. 12. Right upper extremity superficial venous thrombosis: Noted on vascular ultrasound. Keeping blood pressure cuff off that arm. 13. Right carotid artery stenosis: Mild. Resuming anticoagulation. 14. Delirium: Multifactorial.  Precedex taper as improves, PT  Prophylaxis:  heparin drip. Continuing SCDs & Pepcid via tube BID. Diet:  tube feedings. Code Status: Limited code as per family discussion yesterday. Disposition: ?LTAC Family Update: Wife updated  3/2    My cc time x 108m  RKara MeadMD. FCCP. [facility] Pager [phone] no response call [phone]   7:31 AM 09/20/16

## 2016-09-20 NOTE — Progress Notes (Signed)
ANTICOAGULATION CONSULT NOTE Pharmacy Consult for heparin Indication: atrial fibrillation  No Known Allergies  Patient Measurements: Height: 6\' 1"  (185.4 cm) Weight: 225 lb 8.5 oz (102.3 kg) IBW/kg (Calculated) : 79.9 Heparin Dosing Weight: 102 kg  Vital Signs: Temp: 98.6 F (37 C) (03/03 0325) Temp Source: Oral (03/03 0325) BP: 119/67 (03/03 0500) Pulse Rate: 64 (03/03 0500)  Labs:  Recent Labs  09/17/16 1546 09/18/16 0439 09/18/16 0440 09/19/16 0420 09/19/16 1630 09/20/16 0358  HGB  --   --  7.8* 8.3*  --   --   HCT  --   --  25.6* 27.8*  --   --   PLT  --   --  289 299  --   --   APTT 77*  --   --   --   --   --   LABPROT 15.7*  --   --   --   --   --   INR 1.25  --   --   --   --   --   HEPARINUNFRC  --   --  <0.10*  --  0.29* 0.26*  CREATININE  --  1.47*  --  1.42*  --  1.31*    Estimated Creatinine Clearance: 65 mL/min (by C-G formula based on SCr of 1.31 mg/dL (H)).   Assessment: 71 y.o. male with Afib for heparin.  Heparin level remains subtherapeutic  Goal of Therapy:  Heparin level 0.3-0.5 Monitor platelets by anticoagulation protocol: Yes   Plan:  Increase Heparin 1850 units/hr Check heparin level in 8 hours.   Geannie RisenGreg Thoren Hosang, PharmD, BCPS  09/20/2016,5:57 AM

## 2016-09-20 NOTE — Progress Notes (Signed)
[facility]  Subjective  - currently on trach collar feels ok   Objective 123/61 (!) 57 98.6 F (37 C) (Oral) (!) 23 98%  Intake/Output Summary (Last 24 hours) at 09/20/16 0858 Last data filed at 09/20/16 0700  Gross per 24 hour  Intake           999.98 ml  Output             1560 ml  Net          -560.02 ml   Abdomen distended but non tender, incision healing  Assessment/Planning: S/p ruptured AAA Continue nutrition support Vent/trach wean Appreciate critical care assistance  Michael Heath, Michael Heath 09/20/2016 8:58 AM --  Laboratory Lab Results:  Recent Labs  09/19/16 0420 09/20/16 0358  WBC 18.5* 17.5*  HGB 8.3* 8.2*  HCT 27.8* 27.4*  PLT 299 271   BMET  Recent Labs  09/19/16 0420 09/20/16 0358  NA 144 145  K 3.6 3.4*  CL 119* 118*  CO2 21* 22  GLUCOSE 117* 123*  BUN 38* 45*  CREATININE 1.42* 1.31*  CALCIUM 8.2* 8.3*    COAG Lab Results  Component Value Date   INR 1.25 09/17/2016   INR 1.32 09/04/2016   INR 1.41 08/28/2016   No results found for: PTT

## 2016-09-21 LAB — CBC WITH DIFFERENTIAL/PLATELET
BASOS ABS: 0 10*3/uL (ref 0.0–0.1)
BASOS PCT: 0 %
EOS ABS: 0.7 10*3/uL (ref 0.0–0.7)
Eosinophils Relative: 4 %
HCT: 28.3 % — ABNORMAL LOW (ref 39.0–52.0)
Hemoglobin: 8.7 g/dL — ABNORMAL LOW (ref 13.0–17.0)
LYMPHS PCT: 13 %
Lymphs Abs: 2.3 10*3/uL (ref 0.7–4.0)
MCH: 31.4 pg (ref 26.0–34.0)
MCHC: 30.7 g/dL (ref 30.0–36.0)
MCV: 102.2 fL — AB (ref 78.0–100.0)
MONO ABS: 1.4 10*3/uL — AB (ref 0.1–1.0)
Monocytes Relative: 8 %
NEUTROS ABS: 13.1 10*3/uL — AB (ref 1.7–7.7)
Neutrophils Relative %: 75 %
PLATELETS: 240 10*3/uL (ref 150–400)
RBC: 2.77 MIL/uL — ABNORMAL LOW (ref 4.22–5.81)
RDW: 21.4 % — AB (ref 11.5–15.5)
WBC: 17.5 10*3/uL — ABNORMAL HIGH (ref 4.0–10.5)

## 2016-09-21 LAB — GLUCOSE, CAPILLARY
GLUCOSE-CAPILLARY: 145 mg/dL — AB (ref 65–99)
GLUCOSE-CAPILLARY: 137 mg/dL — AB (ref 65–99)
GLUCOSE-CAPILLARY: 139 mg/dL — AB (ref 65–99)
Glucose-Capillary: 129 mg/dL — ABNORMAL HIGH (ref 65–99)
Glucose-Capillary: 140 mg/dL — ABNORMAL HIGH (ref 65–99)
Glucose-Capillary: 156 mg/dL — ABNORMAL HIGH (ref 65–99)

## 2016-09-21 LAB — MAGNESIUM: MAGNESIUM: 2.2 mg/dL (ref 1.7–2.4)

## 2016-09-21 LAB — RENAL FUNCTION PANEL
ANION GAP: 8 (ref 5–15)
Albumin: 1.8 g/dL — ABNORMAL LOW (ref 3.5–5.0)
BUN: 42 mg/dL — ABNORMAL HIGH (ref 6–20)
CHLORIDE: 116 mmol/L — AB (ref 101–111)
CO2: 22 mmol/L (ref 22–32)
Calcium: 8.3 mg/dL — ABNORMAL LOW (ref 8.9–10.3)
Creatinine, Ser: 1.34 mg/dL — ABNORMAL HIGH (ref 0.61–1.24)
GFR calc Af Amer: 60 mL/min — ABNORMAL LOW (ref >60)
GFR calc non Af Amer: 52 mL/min — ABNORMAL LOW (ref >60)
Glucose, Bld: 153 mg/dL — ABNORMAL HIGH (ref 65–99)
PHOSPHORUS: 3.2 mg/dL (ref 2.5–4.6)
POTASSIUM: 3.6 mmol/L (ref 3.5–5.1)
Sodium: 146 mmol/L — ABNORMAL HIGH (ref 135–145)

## 2016-09-21 LAB — HEPARIN LEVEL (UNFRACTIONATED): Heparin Unfractionated: 0.41 IU/mL (ref 0.30–0.70)

## 2016-09-21 MED ORDER — ACETAMINOPHEN 160 MG/5ML PO SOLN
1000.0000 mg | Freq: Three times a day (TID) | ORAL | Status: DC
  Administered 2016-09-21 – 2016-09-22 (×3): 1000 mg
  Filled 2016-09-21 (×3): qty 40.6

## 2016-09-21 MED ORDER — GERHARDT'S BUTT CREAM
TOPICAL_CREAM | CUTANEOUS | Status: DC | PRN
  Filled 2016-09-21: qty 1

## 2016-09-21 NOTE — Progress Notes (Signed)
ANTICOAGULATION CONSULT NOTE - Follow Up Consult  Pharmacy Consult for Heparin Indication: atrial fibrillation and CVA    No Known Allergies  Patient Measurements: Height: 6\' 1"  (185.4 cm) Weight: 227 lb 1.2 oz (103 kg) IBW/kg (Calculated) : 79.9 Heparin Dosing Weight:   Vital Signs: Temp: 97.7 F (36.5 C) (03/04 1341) Temp Source: Oral (03/04 1341) BP: 137/79 (03/04 1128) Pulse Rate: 69 (03/04 1128)  Labs:  Recent Labs  09/19/16 0420  09/20/16 0358 09/20/16 1407 09/20/16 2132 09/21/16 0502  HGB 8.3*  --  8.2*  --   --  8.7*  HCT 27.8*  --  27.4*  --   --  28.3*  PLT 299  --  271  --   --  240  HEPARINUNFRC  --   < > 0.26* 0.64 0.55 0.41  CREATININE 1.42*  --  1.31*  --   --  1.34*  < > = values in this interval not displayed.  Estimated Creatinine Clearance: 63.7 mL/min (by C-G formula based on SCr of 1.34 mg/dL (H)).  Assessment:   Anticoag: heparin for afib w/ CVA (goal 0.3-0.5). Hep gtt started 2/25 for afib. Heparin on hold for trach 3/1; Resumed 3/2. HL 0.41, Hgb 8.2>8.7. Plts 271>240  Goal of Therapy:  Heparin level 0.3-0.5 units/ml Monitor platelets by anticoagulation protocol: Yes   Plan:  - Heparin 1550 units/hr decrease - Daily HL, CBC    Kaira Stringfield S. Merilynn Finlandobertson, PharmD, BCPS Clinical Staff Pharmacist Pager [phone]  Misty Stanleyobertson, Dareen Gutzwiller Stillinger 09/21/2016,2:03 PM

## 2016-09-21 NOTE — Plan of Care (Signed)
Problem: Respiratory: Goal: Ability to achieve and maintain a regular respiratory rate will improve Outcome: Progressing Pt able to tolerate 35 % trach collar for 18 hours before resting on vent with settings of

## 2016-09-21 NOTE — Progress Notes (Signed)
[facility]  Subjective  - feels ok   Objective 126/70 63 97.9 F (36.6 C) (Oral) (!) 24 94%  Intake/Output Summary (Last 24 hours) at 09/21/16 2032 Last data filed at 09/21/16 2000  Gross per 24 hour  Intake          2351.98 ml  Output             2115 ml  Net           236.98 ml   Abdomen soft non distended incision healing Feet pink warm  Assessment/Planning: Tolerating tube feeds  Off vent all day Slowly improving  Fabienne BrunsFields, Roseline Ebarb 09/21/2016 8:32 PM --  Laboratory Lab Results:  Recent Labs  09/20/16 0358 09/21/16 0502  WBC 17.5* 17.5*  HGB 8.2* 8.7*  HCT 27.4* 28.3*  PLT 271 240   BMET  Recent Labs  09/20/16 0358 09/21/16 0502  NA 145 146*  K 3.4* 3.6  CL 118* 116*  CO2 22 22  GLUCOSE 123* 153*  BUN 45* 42*  CREATININE 1.31* 1.34*  CALCIUM 8.3* 8.3*    COAG Lab Results  Component Value Date   INR 1.25 09/17/2016   INR 1.32 09/04/2016   INR 1.41 09/04/2016   No results found for: PTT

## 2016-09-21 NOTE — Plan of Care (Signed)
Problem: Respiratory: Goal: Ability to achieve and maintain a regular respiratory rate will improve Outcome: Progressing Pt able to tolerate 35 % trach collar for 18 hours during the day without s/s increase W.O.B. Pt rested on vent overnight on 40 %/ 18 PRVC/ +5 peep/ Tv 640. Pt maintaining sats > 97 % on both. Minimal secretions noted with ETS, Pt able to mobilize secretions with coughs.

## 2016-09-21 NOTE — Progress Notes (Signed)
[facility] Attending Note  Presenting HPI:  71 y.o. male with past medical history of hypertension, hyperlipidemia, CKD, stage III. Admitted 2/13 with right flank, back pain. A CTA which showed ruptured AAA. He was transferred emergently to [facility] for stent repair.,required massive tr ansfusion , developedabdominal comparment syndrome in OR from massive RP bleed. COurse complicated by  open abdomen, prolonged ventilation, tstomy & acute CVA ?embolic  IMAGING/STUDIES: CTA CHEST/ABD/PELVIS 2/13:  Acutely ruptured 7.8 cm infrarenal abdominal aortic aneurysm with a large right retroperitoneal hematoma. Moderate pulmonary emphysema. Small hiatal hernia. Incidental renal cysts. Colonic diverticulosis. Cholelithiasis. TTE 2/15: LV normal in size with mild concentric hypertrophy. EF 60-65% with normal regional wall motion. Grade 1 diastolic dysfunction without evidence of elevated filling pressure. LA mildly dilated & RA normal in size. RV normal in size and function. No aortic stenosis or regurgitation. Aortic root normal in size. No mitral stenosis or regurgitation. Pulmonic valve poorly visualized but without stenosis. Trivial tricuspid regurgitation. No pericardial effusion. MRI BRAIN W/O 2/20:  Multifocal acute ischemia, including the left frontal watershed region, possibly secondary to an acute hypotensive episode. The other scattered punctate foci of ischemia are more suggestive of a central cardio embolic process. No acute hemorrhage or mass effect. MRI C-SPINE 2/20:  C5-C6 disc osteophyte complex causing severe spinal canal stenosis and severe left, moderate right foraminal narrowing. Mild narrowing at the right C4 and left C5 neural foramina. VENOUS DUPLEX RUE 2/20:  Findings consistent with acute superficial vein thrombosis involving the right cephalic vein. No evidence of deep vein thrombosis involving the right upper extremity. No evidence of deep vein or superficial thrombosis  involving the left upper extremity. BILATERAL CAROTID U/S 2/21:  The vertebral arteries appear patent with antegrade flow. Findings consistent with a 1- 39% stenosis involving the. right internal carotid artery and the left internal carotid artery.  MICROBIOLOGY: MRSA PCR 2/13: Negative Blood Cultures x2 2/15:  Negative  Tracheal Aspirate Culture 2/14: Normal respiratory flora Blood Cultures x2 2/25 >>>ng Tracheal Aspirate Culture 2/27:  Rare Candida albicans Stool C Diff 2/27:  Toxin & Antigen Positive  ANTIBIOTICS: Zinacef 2/14 (periop prophylaxis) Zosyn 2/15 - 2/22 Vancomycin 2/15 - 2/28 Flagyl via tube 2/27 - 2/28 Cefepime 2/22 - 3/2 Vancomycin PO 2/28 >>>  SIGNIFICANT EVENTS: 2/13 - Admit, OR for ruptured AAA. 2/14 - Back to OR for abdomina closure.  2/20 - Found to have superficial venous thrombus right upper extremity 2/21 - A fib w/ RVR started on Diltiazem 2/23 - Extubated to BiPAP 2/25 - Started on Amiodarone drip after bolus for A fib w/ RVR. Febrile>>recultured 2/28 - Transitioned from Amiodarone drip to Amiodarone via tube 3/01 - Trach placed by JY at bedside holding heparin drip for 12 hours prior 3/2 trach collar, PMV 3/3 ATC daytime  Subjective:   Tolerated ATc all day yesterday - back on vent at night Remains on precedex - some sundowning Denies pain, afebrile UO ok   Vent Mode: PRVC FiO2 (%):  [35 %-40 %] 40 % Set Rate:  [18 bmp] 18 bmp Vt Set:  [640 mL] 640 mL PEEP:  [5 cmH20] 5 cmH20 Pressure Support:  [5 cmH20] 5 cmH20 Plateau Pressure:  [14 cmH20-16 cmH20] 15 cmH20  Temp:  [97.6 F (36.4 C)-98.4 F (36.9 C)] 97.8 F (36.6 C) (03/03 2030) Pulse Rate:  [46-67] 63 (03/04 0706) Resp:  [16-29] 27 (03/04 0706) BP: (97-137)/(47-74) 133/74 (03/04 0706) SpO2:  [94 %-100 %] 98 % (03/04 0706) FiO2 (%):  [  35 %-40 %] 40 % (03/04 0706) Weight:  [227 lb 1.2 oz (103 kg)] 227 lb 1.2 oz (103 kg) (03/04 0600)   Gen.: Awake. No distress. Chr  ill Integument: Warm and dry. No rash on exposed skin. No bleeding around tracheostomy stoma. HEENT: Moist mucous membranes. #6 cuffed tracheostomy in place. No scleral icterus. Cardiovascular: Regular rate and rhythm. Sinus rhythm on telemetry. Persistent mild anasarca.  Pulmonary: Overall clear with auscultation. Normal work of breathing with tracheostomy on ventilator. Abdomen: Soft. Protuberant. Normal bowel sounds. Neurological: Following commands. Moving all 4 extremities. RUE 3/5  LINES/TUBES: OGT 2/13 - 2/23 R IJ CORDIS 2/13 - Out (? Date) OETT 8.0 2/13 - 2/23; 8.0 2/26 - 3/1 Perc Trach #6 Cuffed (JY) 3/1 >>> LUE DL PICC 2/20 >>> NGT 2/26 >>> Foley 2/13 >>> PIV  CBC Latest Ref Rng & Units 09/21/2016 09/20/2016 09/19/2016  WBC 4.0 - 10.5 K/uL 17.5(H) 17.5(H) 18.5(H)  Hemoglobin 13.0 - 17.0 g/dL 8.7(L) 8.2(L) 8.3(L)  Hematocrit 39.0 - 52.0 % 28.3(L) 27.4(L) 27.8(L)  Platelets 150 - 400 K/uL 240 271 299    BMP Latest Ref Rng & Units 09/21/2016 09/20/2016 09/19/2016  Glucose 65 - 99 mg/dL 153(H) 123(H) 117(H)  BUN 6 - 20 mg/dL 42(H) 45(H) 38(H)  Creatinine 0.61 - 1.24 mg/dL 1.34(H) 1.31(H) 1.42(H)  BUN/Creat Ratio 10 - 24 - - -  Sodium 135 - 145 mmol/L 146(H) 145 144  Potassium 3.5 - 5.1 mmol/L 3.6 3.4(L) 3.6  Chloride 101 - 111 mmol/L 116(H) 118(H) 119(H)  CO2 22 - 32 mmol/L 22 22 21(L)  Calcium 8.9 - 10.3 mg/dL 8.3(L) 8.3(L) 8.2(L)   Hepatic Function Latest Ref Rng & Units 09/21/2016 09/20/2016 09/19/2016  Total Protein 6.5 - 8.1 g/dL - - -  Albumin 3.5 - 5.0 g/dL 1.8(L) 1.8(L) 1.8(L)  AST 15 - 41 U/L - - -  ALT 17 - 63 U/L - - -  Alk Phosphatase 38 - 126 U/L - - -  Total Bilirubin 0.3 - 1.2 mg/dL - - -  Bilirubin, Direct 0.1 - 0.5 mg/dL - - -      ASSESSMENT/PLAN:  Making progress on ATC , hopeful to liberate him from vent    1. Acute hypoxic respiratory failure s/p Tstomy : ATC x 24h attempt, trach care, pMV 2. Atrial fibrillation: Currently nSR. Ct amiodarone per cardiology  recommendations.Ct heparin drip. 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 4. Sepsis: Secondary to C. difficile colitis. Off IV ABx, PO vanc only ,Continuing enteric precautions. 5. Possible healthcare associated pneumonia: No evidence of infection on repeat tracheostomy culture. Plan to repeat culture for increased secretions or fever. 6. Abdominal aortic aneurysm rupture: Status post repair. Postoperative management per vascular surgery.  7. Anemia:  No evidence of active bleeding. Trending cell counts daily with CBC while on systemic anticoagulation. 8. Acute renal failure: Stable. Continuing to trend urine output with Foley catheter. Monitoring electrolytes and renal function daily. 9. Prolonged QTc:  telemetry. 10. Right upper extremity superficial venous thrombosis: Noted on vascular ultrasound. Keeping blood pressure cuff off that arm. 11. Right carotid artery stenosis: Mild. Resuming anticoagulation. 12. Delirium: Multifactorial.  Precedex taper as improves, PT  Prophylaxis:  heparin drip. Continuing SCDs & Pepcid via tube BID. Diet:  tube feedings. Code Status: Limited code as per family discussion yesterday. Disposition: ?LTAC vs SNF eventual Family Update: Wife updated  3/2    My cc time x 57m  RKara Mead  MD. FCCP. [facility] Pager [phone] If no response call [phone]   8:18 AM 09/21/16

## 2016-09-22 ENCOUNTER — Inpatient Hospital Stay (HOSPITAL_COMMUNITY): Payer: Medicare Other

## 2016-09-22 DIAGNOSIS — G9340 Encephalopathy, unspecified: Secondary | ICD-10-CM

## 2016-09-22 DIAGNOSIS — J9620 Acute and chronic respiratory failure, unspecified whether with hypoxia or hypercapnia: Secondary | ICD-10-CM

## 2016-09-22 DIAGNOSIS — J9621 Acute and chronic respiratory failure with hypoxia: Secondary | ICD-10-CM

## 2016-09-22 LAB — RENAL FUNCTION PANEL
ALBUMIN: 1.7 g/dL — AB (ref 3.5–5.0)
Anion gap: 3 — ABNORMAL LOW (ref 5–15)
BUN: 42 mg/dL — AB (ref 6–20)
CALCIUM: 8.3 mg/dL — AB (ref 8.9–10.3)
CO2: 23 mmol/L (ref 22–32)
Chloride: 118 mmol/L — ABNORMAL HIGH (ref 101–111)
Creatinine, Ser: 1.24 mg/dL (ref 0.61–1.24)
GFR calc Af Amer: >60 mL/min (ref >60)
GFR, EST NON AFRICAN AMERICAN: 57 mL/min — AB (ref >60)
Glucose, Bld: 157 mg/dL — ABNORMAL HIGH (ref 65–99)
PHOSPHORUS: 3.6 mg/dL (ref 2.5–4.6)
POTASSIUM: 3.4 mmol/L — AB (ref 3.5–5.1)
Sodium: 144 mmol/L (ref 135–145)

## 2016-09-22 LAB — CBC WITH DIFFERENTIAL/PLATELET
Basophils Absolute: 0.2 10*3/uL — ABNORMAL HIGH (ref 0.0–0.1)
Basophils Relative: 1 %
EOS ABS: 0.5 10*3/uL (ref 0.0–0.7)
Eosinophils Relative: 3 %
HCT: 29.4 % — ABNORMAL LOW (ref 39.0–52.0)
Hemoglobin: 8.7 g/dL — ABNORMAL LOW (ref 13.0–17.0)
Lymphocytes Relative: 11 %
Lymphs Abs: 1.8 10*3/uL (ref 0.7–4.0)
MCH: 30.3 pg (ref 26.0–34.0)
MCHC: 29.6 g/dL — AB (ref 30.0–36.0)
MCV: 102.4 fL — AB (ref 78.0–100.0)
MONO ABS: 1.3 10*3/uL — AB (ref 0.1–1.0)
Monocytes Relative: 8 %
NEUTROS PCT: 77 %
Neutro Abs: 12.9 10*3/uL — ABNORMAL HIGH (ref 1.7–7.7)
PLATELETS: 235 10*3/uL (ref 150–400)
RBC: 2.87 MIL/uL — ABNORMAL LOW (ref 4.22–5.81)
RDW: 21.1 % — AB (ref 11.5–15.5)
WBC: 16.7 10*3/uL — AB (ref 4.0–10.5)

## 2016-09-22 LAB — MAGNESIUM
Magnesium: 2.2 mg/dL (ref 1.7–2.4)
Magnesium: 2 mg/dL (ref 1.7–2.4)

## 2016-09-22 LAB — GLUCOSE, CAPILLARY
GLUCOSE-CAPILLARY: 154 mg/dL — AB (ref 65–99)
GLUCOSE-CAPILLARY: 139 mg/dL — AB (ref 65–99)
GLUCOSE-CAPILLARY: 124 mg/dL — AB (ref 65–99)
Glucose-Capillary: 130 mg/dL — ABNORMAL HIGH (ref 65–99)
Glucose-Capillary: 143 mg/dL — ABNORMAL HIGH (ref 65–99)
Glucose-Capillary: 107 mg/dL — ABNORMAL HIGH (ref 65–99)

## 2016-09-22 LAB — PHOSPHORUS: PHOSPHORUS: 2.9 mg/dL (ref 2.5–4.6)

## 2016-09-22 LAB — HEPARIN LEVEL (UNFRACTIONATED): HEPARIN UNFRACTIONATED: 0.45 [IU]/mL (ref 0.30–0.70)

## 2016-09-22 MED ORDER — ACETAMINOPHEN 160 MG/5ML PO SOLN
650.0000 mg | Freq: Three times a day (TID) | ORAL | Status: DC
  Administered 2016-09-22 – 2016-09-23 (×3): 650 mg via ORAL
  Filled 2016-09-22 (×4): qty 20.3

## 2016-09-22 MED ORDER — MIRTAZAPINE 15 MG PO TBDP
15.0000 mg | ORAL_TABLET | Freq: Every day | ORAL | Status: DC
  Administered 2016-09-22: 15 mg
  Filled 2016-09-22 (×3): qty 1

## 2016-09-22 MED ORDER — CLONAZEPAM 0.125 MG PO TBDP
0.2500 mg | ORAL_TABLET | Freq: Two times a day (BID) | ORAL | Status: DC
  Administered 2016-09-22: 0.25 mg via ORAL
  Filled 2016-09-22 (×2): qty 2

## 2016-09-22 MED ORDER — METOPROLOL TARTRATE 5 MG/5ML IV SOLN
INTRAVENOUS | Status: AC
  Filled 2016-09-22: qty 5

## 2016-09-22 MED ORDER — AMIODARONE IV BOLUS ONLY 150 MG/100ML
150.0000 mg | Freq: Once | INTRAVENOUS | Status: AC
  Administered 2016-09-22: 150 mg via INTRAVENOUS

## 2016-09-22 MED ORDER — METOPROLOL TARTRATE 5 MG/5ML IV SOLN
5.0000 mg | INTRAVENOUS | Status: DC | PRN
  Administered 2016-09-22: 5 mg via INTRAVENOUS

## 2016-09-22 MED ORDER — AMIODARONE LOAD VIA INFUSION
150.0000 mg | Freq: Once | INTRAVENOUS | Status: AC
  Administered 2016-09-22: 150 mg via INTRAVENOUS

## 2016-09-22 MED ORDER — AMIODARONE HCL IN DEXTROSE 360-4.14 MG/200ML-% IV SOLN
60.0000 mg/h | INTRAVENOUS | Status: DC
  Administered 2016-09-22: 60 mg/h via INTRAVENOUS
  Filled 2016-09-22: qty 400

## 2016-09-22 MED ORDER — POTASSIUM CHLORIDE 2 MEQ/ML IV SOLN
30.0000 meq | Freq: Once | INTRAVENOUS | Status: AC
  Administered 2016-09-22: 30 meq via INTRAVENOUS
  Filled 2016-09-22: qty 15

## 2016-09-22 MED ORDER — POTASSIUM CHLORIDE 20 MEQ/15ML (10%) PO SOLN
40.0000 meq | Freq: Once | ORAL | Status: AC
  Administered 2016-09-22: 40 meq
  Filled 2016-09-22: qty 30

## 2016-09-22 MED ORDER — AMIODARONE HCL IN DEXTROSE 360-4.14 MG/200ML-% IV SOLN
INTRAVENOUS | Status: AC
  Filled 2016-09-22: qty 200

## 2016-09-22 MED ORDER — AMIODARONE HCL IN DEXTROSE 360-4.14 MG/200ML-% IV SOLN
30.0000 mg/h | INTRAVENOUS | Status: DC
  Administered 2016-09-22 – 2016-09-23 (×3): 30 mg/h via INTRAVENOUS
  Filled 2016-09-22 (×2): qty 200

## 2016-09-22 MED ORDER — ACETAMINOPHEN 325 MG PO TABS: 650.0000 mg | ORAL_TABLET | Freq: Three times a day (TID) | ORAL | Status: DC

## 2016-09-22 MED ORDER — HYDROCOD POLST-CPM POLST ER 10-8 MG/5ML PO SUER
5.0000 mL | Freq: Every day | ORAL | Status: DC
  Administered 2016-09-22: 5 mL via ORAL
  Filled 2016-09-22: qty 5

## 2016-09-22 MED ORDER — TRAZODONE HCL 50 MG PO TABS: 50.0000 mg | ORAL_TABLET | Freq: Every evening | ORAL | Status: DC | PRN

## 2016-09-22 MED ORDER — METOPROLOL TARTRATE 5 MG/5ML IV SOLN
5.0000 mg | Freq: Four times a day (QID) | INTRAVENOUS | Status: DC
  Administered 2016-09-22 – 2016-09-23 (×4): 5 mg via INTRAVENOUS
  Filled 2016-09-22 (×4): qty 5

## 2016-09-22 NOTE — Progress Notes (Signed)
Occupational Therapy Evaluation Patient Details Name: Michael Heath MRN: [id number] DOB: 02-Dec-1945 Today's Date: 09/22/2016    History of Present Illness Patient is a 71 yo male admitted 09/10/2016 with Rt flank pain.  Patient with ruptured AAA, now s/p repair.  Post-op retroperitoneal bleed with abd compartment syndrome.  Post-op Afib and acute hypoxic resp failure, intubated 08/02/16.  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  Extubated 09/12/16.    PMH:  CAD, CKD, HTN, HLD. pt now with trach and NG tube.   Clinical Impression   PTA, pt independent with ADL and mobility and was very active. Pt presents with significant functional deficits and currently requires Max A with ADL and Max A +2 with mobility. Currently recommend LTACH for rehab, however, if pt continues to improve, feel he will be a good CIR candidate. HR increased from @ 100  to 150s and O2 Sats decrease to 88. Nsg aware. Excellent participation. Pt very motivated with therapy. Will follow acutely to maximize functional level of independence and facilitate DC to next venue of care.     Follow Up Recommendations  Supervision/Assistance - 24 hour;LTACH    Equipment Recommendations  Other (comment) (TBA )    Recommendations for Other Services       Precautions / Restrictions Precautions Precautions: Fall (trach) Precaution Comments: R weakness Restrictions Weight Bearing Restrictions: No      Mobility Bed Mobility               General bed mobility comments: pt up in chair  Transfers Overall transfer level: Needs assistance Equipment used: 2 person hand held assist (with bed pad and eva walker) Transfers: Sit to/from Stand Sit to Stand: Max assist;+2 physical assistance Stand pivot transfers: Max assist;+2 physical assistance       General transfer comment: complete sit to stand x 2 trials. Pt requires maxAx2 for initial power up and then assist to bring UEs up to EVA walker. pt able to  maintain standing once in walker with maxAt posterior hips. Pt with R Lateral/posterior lean. Improved upright postural positioning with vc and tactile cues anteriorly    Balance Overall balance assessment: Needs assistance Sitting-balance support: Bilateral upper extremity supported;Feet supported Sitting balance-Leahy Scale: Poor Sitting balance - Comments: pt unable to maintain midline or upright without modA posteriorly. pt with L lateral lean Postural control: Posterior lean;Right lateral lean                                  ADL Overall ADL's : Needs assistance/impaired Eating/Feeding: NPO Eating/Feeding Details (indicate cue type and reason): ice chips only; able to bring ice chaip to mouth with L hand and holding cup with R hand Grooming: Moderate assistance Grooming Details (indicate cue type and reason): able to wash face with L hand Upper Body Bathing: Moderate assistance;Sitting   Lower Body Bathing: Maximal assistance;Sit to/from stand   Upper Body Dressing : Maximal assistance;Sitting   Lower Body Dressing: Maximal assistance;Sit to/from stand               Functional mobility during ADLs: Maximal assistance;+2 for physical assistance;Cueing for safety;Cueing for sequencing       Vision   Additional Comments: will further assess     Perception Perception Comments: will further assess.    Praxis      Pertinent Vitals/Pain Pain Assessment: No/denies pain     Hand Dominance Left   Extremity/Trunk  Assessment Upper Extremity Assessment Upper Extremity Assessment: RUE deficits/detail RUE Deficits / Details: Gross grasp/release. Able to hold cup. Unable to lift hand to touch mouth. Difficulty maintaining active control of RUE. Using Carley HammedEva walker - max A to lift RUE to [employer] Warner[employer]va walker. Mod A to maintain position of RUE on Eva walkerWill further assess RUE Coordination: decreased gross motor;decreased fine motor   Lower Extremity Assessment Lower  Extremity Assessment: Defer to PT evaluation   Cervical / Trunk Assessment Cervical / Trunk Assessment: Kyphotic;Other exceptions (posteiror R bias)  Pt does well with vc/target anterior to facilitate anterior/L weight shift.   Communication     Cognition Arousal/Alertness: Awake/alert Behavior During Therapy: WFL for tasks assessed/performed Overall Cognitive Status: Impaired/Different from baseline Area of Impairment: Attention;Following commands;Problem solving;Safety/judgement;Awareness   Current Attention Level: Sustained Memory: Decreased short-term memory Following Commands: Follows one step commands with increased time Safety/Judgement: Decreased awareness of safety;Decreased awareness of deficits Awareness: Emergent Problem Solving: Requires verbal cues;Requires tactile cues;Slow processing;Decreased initiation General Comments: pt much improved from last week   General Comments       Exercises       Shoulder Instructions      Home Living Family/patient expects to be discharged to:: Inpatient rehab (OR SNF) Living Arrangements: Spouse/significant other                                      Prior Functioning/Environment Level of Independence: Independent                 OT Problem List: Decreased strength;Decreased range of motion;Decreased activity tolerance;Impaired balance (sitting and/or standing);Impaired vision/perception;Decreased coordination;Decreased cognition;Decreased safety awareness;Decreased knowledge of use of DME or AE;Decreased knowledge of precautions;Cardiopulmonary status limiting activity;Impaired sensation;Impaired tone;Obesity;Impaired UE functional use      OT Treatment/Interventions: Self-care/ADL training;Therapeutic exercise;Neuromuscular education;Energy conservation;DME and/or AE instruction;Therapeutic activities;Cognitive remediation/compensation;Visual/perceptual remediation/compensation;Patient/family  education;Balance training    OT Goals(Current goals can be found in the care plan section) Acute Rehab OT Goals Patient Stated Goal: i want water OT Goal Formulation: With patient Time For Goal Achievement: 10/06/16 Potential to Achieve Goals: Good  OT Frequency: Min 2X/week   Barriers to D/C:            Co-evaluation PT/OT/SLP Co-Evaluation/Treatment: Yes Reason for Co-Treatment: Complexity of the patient's impairments (multi-system involvement);For patient/therapist safety PT goals addressed during session: Mobility/safety with mobility OT goals addressed during session: ADL's and self-care;Other (comment) (functional mobility)      End of Session Equipment Utilized During Treatment: Gait belt;Other (comment) Fara Boros(Eva Walker) Nurse Communication: Mobility status (HR)  Activity Tolerance: Patient tolerated treatment well Patient left: in chair;with call bell/phone within reach  OT Visit Diagnosis: Unsteadiness on feet (R26.81);Muscle weakness (generalized) (M62.81)                ADL either performed or assessed with clinical judgement  Time: 1011-1042 OT Time Calculation (min): 31 min Charges:  OT General Charges $OT Visit: 1 Procedure OT Evaluation $OT Eval High Complexity: 1 Procedure G-Codes:     Shown Dissinger, OT/L  [phone] 09/22/2016  Sreekar Broyhill,HILLARY 09/22/2016, 2:58 PM

## 2016-09-22 NOTE — Procedures (Signed)
Objective Swallowing Evaluation: Type of Study: FEES-Fiberoptic Endoscopic Evaluation of Swallow  Patient Details  Name: Michael AmenDavid T Desena MRN: [id number] Date of Birth: 02/13/1946  Today's Date: 09/22/2016 Time: SLP Start Time (ACUTE ONLY): 1620-SLP Stop Time (ACUTE ONLY): 1720 SLP Time Calculation (min) (ACUTE ONLY): 60 min  Past Medical History:  Past Medical History:  Diagnosis Date  . C. difficile colitis 09/16/2016   Past Surgical History:  Past Surgical History:  Procedure Laterality Date  . ABDOMINAL AORTIC ENDOVASCULAR STENT GRAFT N/A 09/14/2016   Procedure: ABDOMINAL AORTIC ENDOVASCULAR STENT GRAFT;  Surgeon: Nada LibmanVance W Brabham, MD;  [facility];  Service: Vascular;  Laterality: N/A;  . ABDOMINAL EXPOSURE  09/05/2016   Procedure: Abdominal Exploration;  Surgeon: Nada LibmanVance W Brabham, MD;  [facility];  Service: Vascular;;  . APPLICATION OF WOUND VAC  08/25/2016   Procedure: APPLICATION OF WOUND VAC;  Surgeon: Nada LibmanVance W Brabham, MD;  Location: [facility] OR;  Service: Vascular;;  . CATARACT EXTRACTION W/PHACO Right 03/2010  . CATARACT EXTRACTION W/PHACO Left 02/24/2013   Procedure: CATARACT EXTRACTION PHACO AND INTRAOCULAR LENS PLACEMENT (IOC);  Surgeon: Gemma PayorKerry Hunt, MD;  Location: [facility] ORS;  Service: Ophthalmology;  Laterality: Left;  CDE: 13.82  . ESOPHAGOGASTRODUODENOSCOPY N/A 08/26/2016   Procedure: ESOPHAGOGASTRODUODENOSCOPY (EGD);  Surgeon: Napoleon FormKavitha Nandigam V, MD;  [facility];  Service: Endoscopy;  Laterality: N/A;  . EYE SURGERY    . HEMATOMA EVACUATION  09/05/2016   Procedure: EVACUATION HEMATOMA;  Surgeon: Nada LibmanVance W Brabham, MD;  [facility];  Service: Vascular;;  . LAPAROTOMY N/A 09/15/2016   Procedure: EXPLORATORY LAPAROTOMY;  Surgeon: Nada LibmanVance W Brabham, MD;  [facility];  Service: Vascular;  Laterality: N/A;   HPI: 71 year old with past medical history of hypertension, hyperlipidemia, CKD, stage III. Admitted 08/29/2016 with ruptured AAA.; successful emergent stent placement  but has had multiple complications including RPB, open abdominal wound, respiratory failure, requiring tracheostomy placement and a hypotensive stroke v. Embolic Afib ischemic lesions. Intubated 2/13- 2/23; reintubated 2/26; trach 3/1. MRI 2/20 showed multifocal acute ischemia, including the left frontal watershed region. MBS on 09/13/16 revealed: "moderate oral and severe pharyngeal dysphagia with frank aspiration of thin liquids, penetration of honey-thick liquids and pureed solids with suspected aspiration of material mixed with secretions. Oral stage of swallowing noted for lingual pumping, weak lingual manipulation, delayed oral transit and retrograde movement of bolus from base of tongue to the oral cavity (puree). Swallow is delayed to the pyriform sinuses with thin, and after spillover the epiglottis with honey and pureed consistencies. Cervical osteophytes and protrusion of posterior pharyngeal wall in initial image. SLP noted during examination pt with unilateral posterior pharyngeal protrusion which impeded epiglottic deflection and airway closure, leading to severe residue in the valleculae and posterior pharyngeal wall with all consistencies (thin, honey-thick liquids and pureed), unilateral bolus flow, penetration/aspiration during and after the swallow. " Likely a chronic, premorbid dysphagia was per notes.   Subjective: tired, but willing to participate   Assessment / Plan / Recommendation  CHL IP CLINICAL IMPRESSIONS 09/22/2016  Clinical Impression Pt presents with a severe pharyngeal dysphagia.  Most notable is asymmetric protuberance left side of posterior pharyngeal wall, due to cervical osteophytes noted on CT spine (large, anterior osteophytes C3-6).  Presence prohibits epiglottic closure over larynx.  In addition, there are sensory deficits that lead to mistiming of swallow.  There was spillage of all tested consistencies - ice chips, honey thick liquid, puree - into the laryngeal  vestibule, mixing with copious standing secretions,  and eventually being aspirated without a protective cough response.  Postural adjustments were made in an effort to bypass osteophytes - they were not effective.  Pt's wife describes chronic swallowing issues for years - coughing/expectoration, particularly with solid foods - which eventually prevented the couple from eating in restaurants.  Now with two oral intubations, tracheostomy, CVA, and deconditioning, pt is no longer able to compensate for structural deficits, leading to significant dysphagia.  Results were explained to pt and later to Mrs. Jim - encouraged her to speak with Dr. Phillips Odor to determine how this dysphagia may impact overall GOC.  SLP will continue to follow for education, PMV use, safety.   SLP Visit Diagnosis Dysphagia, pharyngeal phase (R13.13)  Attention and concentration deficit following --  Frontal lobe and executive function deficit following --  Impact on safety and function Severe aspiration risk      CHL IP TREATMENT RECOMMENDATION 09/19/2016  Treatment Recommendations F/U FEES in --- days (Comment);Therapy as outlined in treatment plan below     Prognosis 09/22/2016  Prognosis for Safe Diet Advancement Guarded  Barriers to Reach Goals --  Barriers/Prognosis Comment --    CHL IP DIET RECOMMENDATION 09/22/2016  SLP Diet Recommendations NPO  Liquid Administration via --  Medication Administration Via alternative means  Compensations --  Postural Changes --      CHL IP OTHER RECOMMENDATIONS 09/22/2016  Recommended Consults --  Oral Care Recommendations Oral care QID  Other Recommendations --      CHL IP FOLLOW UP RECOMMENDATIONS 09/19/2016  Follow up Recommendations (No Data)      CHL IP FREQUENCY AND DURATION 09/22/2016  Speech Therapy Frequency (ACUTE ONLY) min 3x week  Treatment Duration 2 weeks           CHL IP ORAL PHASE 09/22/2016  Oral Phase WFL  Oral - Pudding Teaspoon --  Oral - Pudding Cup --   Oral - Honey Teaspoon --  Oral - Honey Cup --  Oral - Nectar Teaspoon --  Oral - Nectar Cup --  Oral - Nectar Straw --  Oral - Thin Teaspoon --  Oral - Thin Cup --  Oral - Thin Straw --  Oral - Puree --  Oral - Mech Soft --  Oral - Regular --  Oral - Multi-Consistency --  Oral - Pill --  Oral Phase - Comment --    CHL IP PHARYNGEAL PHASE 09/22/2016  Pharyngeal Phase Impaired  Pharyngeal- Pudding Teaspoon --  Pharyngeal --  Pharyngeal- Pudding Cup --  Pharyngeal --  Pharyngeal- Honey Teaspoon Delayed swallow initiation-pyriform sinuses;Reduced pharyngeal peristalsis;Reduced epiglottic inversion;Reduced airway/laryngeal closure;Penetration/Aspiration before swallow;Penetration/Aspiration during swallow;Penetration/Apiration after swallow;Trace aspiration;Pharyngeal residue - pyriform  Pharyngeal Material enters airway, passes BELOW cords without attempt by patient to eject out (silent aspiration)  Pharyngeal- Honey Cup --  Pharyngeal --  Pharyngeal- Nectar Teaspoon --  Pharyngeal --  Pharyngeal- Nectar Cup --  Pharyngeal --  Pharyngeal- Nectar Straw --  Pharyngeal --  Pharyngeal- Thin Teaspoon Delayed swallow initiation-pyriform sinuses;Reduced pharyngeal peristalsis;Reduced epiglottic inversion;Reduced airway/laryngeal closure;Penetration/Aspiration before swallow;Penetration/Aspiration during swallow;Penetration/Apiration after swallow;Trace aspiration;Pharyngeal residue - pyriform  Pharyngeal Material enters airway, passes BELOW cords without attempt by patient to eject out (silent aspiration)  Pharyngeal- Thin Cup --  Pharyngeal --  Pharyngeal- Thin Straw --  Pharyngeal --  Pharyngeal- Puree Delayed swallow initiation-pyriform sinuses;Reduced pharyngeal peristalsis;Reduced epiglottic inversion;Reduced airway/laryngeal closure;Penetration/Aspiration before swallow;Penetration/Aspiration during swallow;Penetration/Apiration after swallow;Trace aspiration;Pharyngeal residue -  pyriform  Pharyngeal Material enters airway, passes BELOW cords without attempt by patient to  eject out (silent aspiration)  Pharyngeal- Mechanical Soft --  Pharyngeal --  Pharyngeal- Regular --  Pharyngeal --  Pharyngeal- Multi-consistency --  Pharyngeal --  Pharyngeal- Pill --  Pharyngeal --  Pharyngeal Comment --     CHL IP CERVICAL ESOPHAGEAL PHASE 09/13/2016  Cervical Esophageal Phase WFL  Pudding Teaspoon --  Pudding Cup --  Honey Teaspoon --  Honey Cup --  Nectar Teaspoon --  Nectar Cup --  Nectar Straw --  Thin Teaspoon --  Thin Cup --  Thin Straw --  Puree --  Mechanical Soft --  Regular --  Multi-consistency --  Pill --  Cervical Esophageal Comment --    No flowsheet data found.  Blenda Mounts Laurice 09/22/2016, 4:10 PM

## 2016-09-22 NOTE — Progress Notes (Signed)
ANTICOAGULATION CONSULT NOTE - Follow Up Consult  Pharmacy Consult for Heparin Indication: atrial fibrillation and stroke  No Known Allergies  Patient Measurements: Height: 6\' 1"  (185.4 cm) Weight: 233 lb 14.5 oz (106.1 kg) IBW/kg (Calculated) : 79.9 Heparin Dosing Weight: 102 kg  Vital Signs: Temp: 98.8 F (37.1 C) (03/05 1209) Temp Source: Oral (03/05 1209) BP: 183/115 (03/05 1105) Pulse Rate: 143 (03/05 1105)  Labs:  Recent Labs  09/20/16 0358  09/20/16 2132 09/21/16 0502 09/22/16 0410  HGB 8.2*  --   --  8.7* 8.7*  HCT 27.4*  --   --  28.3* 29.4*  PLT 271  --   --  240 235  HEPARINUNFRC 0.26*  < > 0.55 0.41 0.45  CREATININE 1.31*  --   --  1.34* 1.24  < > = values in this interval not displayed.  Estimated Creatinine Clearance: 69.9 mL/min (by C-G formula based on SCr of 1.24 mg/dL).  Assessment:   Heparin level remains therapeutic (0.45), and lower end of usual target range for afib and CVA. Hx recent ruptured AAA and large retroperitoneal hematoma (09/04/2016).    On IV heparin since 09/14/16, except for interruption 3/1 for trach, resumed 3/2.    CBC low stable.  No bleeding noted.  Goal of Therapy:  Heparin level 0.3-0.5 units/ml Monitor platelets by anticoagulation protocol: Yes   Plan:   Continue heparin drip at 1550 units/hr.  Daily heparin level and CBC.  Monitor for any bleeding.  Dennie Fettersgan, Denishia Citro [place], [place] Pager: [phone] 09/22/2016,12:33 PM

## 2016-09-22 NOTE — Progress Notes (Signed)
Patient converted to NSR at approximately 2115 on Amio gtt. Will continue to monitor.   Michael Heath

## 2016-09-22 NOTE — Progress Notes (Addendum)
[facility] Attending Note  Presenting HPI:  71 y.o. male with past medical history of hypertension, hyperlipidemia, CKD, stage III. Admitted 2/13 with right flank, back pain. A CTA which showed ruptured AAA. He was transferred emergently to [facility] for stent repair.,required massive tr ansfusion , developedabdominal comparment syndrome in OR from massive RP bleed. COurse complicated by  open abdomen, prolonged ventilation, tstomy & acute CVA ?embolic  IMAGING/STUDIES: CTA CHEST/ABD/PELVIS 2/13:  Acutely ruptured 7.8 cm infrarenal abdominal aortic aneurysm with a large right retroperitoneal hematoma. Moderate pulmonary emphysema. Small hiatal hernia. Incidental renal cysts. Colonic diverticulosis. Cholelithiasis. TTE 2/15: LV normal in size with mild concentric hypertrophy. EF 60-65% with normal regional wall motion. Grade 1 diastolic dysfunction without evidence of elevated filling pressure. LA mildly dilated & RA normal in size. RV normal in size and function. No aortic stenosis or regurgitation. Aortic root normal in size. No mitral stenosis or regurgitation. Pulmonic valve poorly visualized but without stenosis. Trivial tricuspid regurgitation. No pericardial effusion. MRI BRAIN W/O 2/20:  Multifocal acute ischemia, including the left frontal watershed region, possibly secondary to an acute hypotensive episode. The other scattered punctate foci of ischemia are more suggestive of a central cardio embolic process. No acute hemorrhage or mass effect. MRI C-SPINE 2/20:  C5-C6 disc osteophyte complex causing severe spinal canal stenosis and severe left, moderate right foraminal narrowing. Mild narrowing at the right C4 and left C5 neural foramina. VENOUS DUPLEX RUE 2/20:  Findings consistent with acute superficial vein thrombosis involving the right cephalic vein. No evidence of deep vein thrombosis involving the right upper extremity. No evidence of deep vein or superficial thrombosis  involving the left upper extremity. BILATERAL CAROTID U/S 2/21:  The vertebral arteries appear patent with antegrade flow. Findings consistent with a 1- 39% stenosis involving the. right internal carotid artery and the left internal carotid artery.  MICROBIOLOGY: MRSA PCR 2/13: Negative Blood Cultures x2 2/15:  Negative  Tracheal Aspirate Culture 2/14: Normal respiratory flora Blood Cultures x2 2/25 >>>ng Tracheal Aspirate Culture 2/27:  Rare Candida albicans Stool C Diff 2/27:  Toxin & Antigen Positive  ANTIBIOTICS: Zinacef 2/14 (periop prophylaxis) Zosyn 2/15 - 2/22 Vancomycin 2/15 - 2/28 Flagyl via tube 2/27 - 2/28 Cefepime 2/22 - 3/2 Vancomycin PO 2/28 >>>  SIGNIFICANT EVENTS: 2/13 - Admit, OR for ruptured AAA. 2/14 - Back to OR for abdomina closure.  2/20 - Found to have superficial venous thrombus right upper extremity 2/21 - A fib w/ RVR started on Diltiazem 2/23 - Extubated to BiPAP 2/25 - Started on Amiodarone drip after bolus for A fib w/ RVR. Febrile>>recultured 2/28 - Transitioned from Amiodarone drip to Amiodarone via tube 3/01 - Trach placed by JY at bedside holding heparin drip for 12 hours prior 3/2 trach collar, PMV 3/4 ATC 24/7  Subjective:   Feels better.  Did not have to use the vent last night.  Has been on ATC x 24 hrs. Less diarrhea.  Tolerating TF.  Sundowning at night.  (-) other subjective complaints.    FiO2 (%):  [30 %] 30 %  Temp:  [97.7 F (36.5 C)-99.5 F (37.5 C)] 98.1 F (36.7 C) (03/05 0732) Pulse Rate:  [52-81] 63 (03/05 0717) Resp:  [17-28] 25 (03/05 0717) BP: (113-142)/(63-104) 136/69 (03/05 0717) SpO2:  [93 %-96 %] 95 % (03/05 0717) FiO2 (%):  [30 %] 30 % (03/05 0717) Weight:  [106.1 kg (233 lb 14.5 oz)] 106.1 kg (233 lb 14.5 oz) (03/05 0600)   Gen.:  Awake. No distress. Chr ill appearing. Sitting on chair.  Integument: Warm and dry. No rash on exposed skin. No bleeding around tracheostomy stoma. HEENT: Moist mucous  membranes. #6 cuffed tracheostomy in place. No scleral icterus. Cardiovascular: Regular rate and rhythm. Sinus rhythm on telemetry. Persistent mild anasarca.  Pulmonary: Overall clear with auscultation with some bibasilar crackles.  Normal work of breathing with tracheostomy on ventilator. Abdomen: Soft. Protuberant. Normal bowel sounds. Neurological: Following commands. Moving all 4 extremities. RUE 3/5  LINES/TUBES: OGT 2/13 - 2/23 R IJ CORDIS 2/13 - Out (? Date) OETT 8.0 2/13 - 2/23; 8.0 2/26 - 3/1 Perc Trach #6 Cuffed (JY) 3/1 >>> LUE DL PICC 2/20 >>> NGT 2/26 >>> Foley 2/13 >>> PIV  CBC Latest Ref Rng & Units 09/22/2016 09/21/2016 09/20/2016  WBC 4.0 - 10.5 K/uL 16.7(H) 17.5(H) 17.5(H)  Hemoglobin 13.0 - 17.0 g/dL 8.7(L) 8.7(L) 8.2(L)  Hematocrit 39.0 - 52.0 % 29.4(L) 28.3(L) 27.4(L)  Platelets 150 - 400 K/uL 235 240 271    BMP Latest Ref Rng & Units 09/22/2016 09/21/2016 09/20/2016  Glucose 65 - 99 mg/dL 157(H) 153(H) 123(H)  BUN 6 - 20 mg/dL 42(H) 42(H) 45(H)  Creatinine 0.61 - 1.24 mg/dL 1.24 1.34(H) 1.31(H)  BUN/Creat Ratio 10 - 24 - - -  Sodium 135 - 145 mmol/L 144 146(H) 145  Potassium 3.5 - 5.1 mmol/L 3.4(L) 3.6 3.4(L)  Chloride 101 - 111 mmol/L 118(H) 116(H) 118(H)  CO2 22 - 32 mmol/L _0 Calcium 8.9 - 10.3 mg/dL 8.3(L) 8.3(L) 8.3(L)   Hepatic Function Latest Ref Rng & Units 09/22/2016 09/21/2016 09/20/2016  Total Protein 6.5 - 8.1 g/dL - - -  Albumin 3.5 - 5.0 g/dL 1.7(L) 1.8(L) 1.8(L)  AST 15 - 41 U/L - - -  ALT 17 - 63 U/L - - -  Alk Phosphatase 38 - 126 U/L - - -  Total Bilirubin 0.3 - 1.2 mg/dL - - -  Bilirubin, Direct 0.1 - 0.5 mg/dL - - -      ASSESSMENT/PLAN:   71 y.o. male with past medical history of hypertension, hyperlipidemia, CKD, stage III. Admitted 2/13 with right flank, back pain. A CTA which showed ruptured AAA. He was transferred emergently to [facility] for stent repair.,required massive tr ansfusion , developedabdominal comparment syndrome in OR from  massive RP bleed. COurse complicated by  open abdomen, prolonged ventilation, tstomy & acute CVA ?embolic.   Patient has been on ATC the last 24 hrs.   1. Acute hypoxic respiratory failure s/p Tstomy : ATC as long as possible.  Keep o2 sats > 88%. Vent as needed. No plans for decannulation yet. Cont PMV.  2. Atrial fibrillation: Currently nSR. Ct amiodarone per cardiology recommendations.Ct heparin drip. Will defer to Cards and Surgery re: switching to PO meds.  3. 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 4. Sepsis: Secondary to C. difficile colitis. Off IV ABx, PO vanc only ,Continuing enteric precautions. 5. Possible healthcare associated pneumonia: No evidence of infection on repeat tracheostomy culture. (-) increase in secretions and o2 requirement.  Cultures remain (-).  Observe off abx for now.  6. Abdominal aortic aneurysm rupture: Status post repair. Postoperative management per vascular surgery. 7. Anemia:  No evidence of active bleeding. Trending cell counts daily with CBC while on systemic anticoagulation. 8. Acute renal failure: Stable. Continuing to trend urine output with Foley catheter. Monitoring electrolytes and renal function daily. 9. Prolonged QTc:  telemetry. 10. Right upper  extremity superficial venous thrombosis: Noted on vascular ultrasound. Keeping blood pressure cuff off that arm. 11. Right carotid artery stenosis: Mild. Resuming anticoagulation. 12. Delirium: Multifactorial.  Try to wean off precedex.  Will add trazodone prn at bedtime and scheduled low dose clonazepam on 3/5.   Prophylaxis:  heparin drip. Continuing SCDs & Pepcid via tube BID. Diet:  tube feedings. Plan for swallow evaln today.  Code Status: Limited code as per family discussion yesterday. Disposition: ?LTAC vs SNF evetually.  Spoke with case manager today.  Family Update: Wife updated  3/5.   Over all plan :  Keep on ATC as long as tolerated.   Work on placement.  Try to wean off precedex.   Monica Becton, MD 09/22/2016, 8:43 AM [facility] Pager [phone] After 3 pm or if no answer, call [phone]

## 2016-09-22 NOTE — Progress Notes (Signed)
eLink Physician-Brief Progress Note Patient Name: Michael Heath DOB: 1945-08-24 MRN: [id number]   Date of Service  09/22/2016  HPI/Events of Note  stil tachy rr up  eICU Interventions  Back to vent amio bolus 150     Intervention Category Major Interventions: Arrhythmia - evaluation and management  Nelda BucksFEINSTEIN,DANIEL J. 09/22/2016, 7:07 PM

## 2016-09-22 NOTE — Progress Notes (Signed)
Patient in Afib with RVR Dr. Christene Slatese Dios notified and orders received.  Will continue to monitor.

## 2016-09-22 NOTE — Progress Notes (Signed)
Responded to consult for palliative. Pt was having procedure, so spoke at length wife wife about where they were in various processes while she was in the 2S waiting rm -- and also with the speech therapist who ultimately came to bring the news that his swallow test had shown pt's muscles were very weak.   Wife has a sunny corner of 2S waiting rm and has slept there for weeks, only occasionally returning home (an hr away) for a day -- quite a setup, pt's nurse said. Pt's children live farther away (at least 3 hrs), their pastor has come several times, and [place] [facility] has put them on their prayer list. She is satisfied with her present set-up, and would not want to go to MD house.   Pt's wife is a woman of strong faith. She keeps her Bible with her in her corner, and sometimes wishes God would take her husband as both her parents went -- quickly. She feels overwhelmed at all the experiences now of what to decide and ask in the hospital, even though she feels gratitude for the care and concern of all the medical staff. Some of them bring her cookies and flowers in her corner and write prayers in the chapel book for her and her husband (that she feels moved to find and read).  I encouraged her to keep asking questions of pt's doctors, especially of Dr. Phillips OdorGolding, whom she said had talked with her last week and invited her to call any time with questions. (Pt's wife is not sure she always understands the implications of what her husband's doctors are saying to have questions at the time.) The speech therapist also explained that the palliative staff knows about the whole pt, and will try to be advising them on options with what is overall best for that pt as the highest priority. What does her husband want? Is the key question for him and her now to consider.  Pt's wife said her husband never liked to go to funerals, and didn't want to talk about their making advanced funeral arrangements when she'd  suggested it. But he had told her once he didn't want to be like a man lying in a nursing home they saw who seemed helpless. She worries how long they could afford to have her husband at home. She doesn't want to see him suffer. She knows he can make his own choices, but it hurts her to see him cry. She knows he knows she will be alright, whatever happens. She has told him that. Chaplain available for f/u with pt and/or his wife at another time.   09/22/16 1500  Clinical Encounter Type  Visited With Family;Health care provider  Visit Type Initial;Psychological support;Spiritual support;Social support  Referral From Nurse  Spiritual Encounters  Spiritual Needs Prayer;Emotional;Grief support  Stress Factors  Patient Stress Factors Health changes;Loss of control  Family Stress Factors Exhausted;Family relationships;Health changes;Loss;Loss of control   Ephraim Hamburgerynthia A Rashidah [place], [address]

## 2016-09-22 NOTE — Progress Notes (Signed)
Palliative Care Progress Note  I met with Michael Heath and Michael Heath today. He looks much better today than Friday, but after working with PT now having issues with A Fib with RVR. New issues with delirium at night. Patient and Heath very open to talking about Michael current level of suffering and also the future. Michael Heath stated very clearly that Michael main goal is to be able to go home with Michael Heath and pet Michael dog -even if that means he will not live a long life, he is adamant that he does not want a life in a skilled nursing facility. I discussed a variety of possible trajectories including best and worst possible case scenarios. There will continue to be challenging decisions moving forward- he is at high risk for developing complications, especially the long he is in the hospital-our team will continue to align our medical interventions with their goals-he has come a long way and continues to want reasonable medical interventions and have every best chance to improved.   1. Uncomplicated, Delirium-Sundowning 2. ICU/Critical Illness Depression  Avoid benzodiazapine's and tricyclics  Will start Remeron 110m at bedtime   Pain is most likely the driving factor- Michael Tylenol was stopped so I will reorder this to be given on a scheduled basis-it appeared to be helping and improved the pain around Michael trach site.  Will schedule a low dose Tussionex at bedtime- he took this PTA and helpful for sleep 3. Ordered a CXR as part of complete delirium work up and he has thicker secretions and more of them-much wetter respirtory sounds and with AFIB increased concern for pulmonary edema 4. Spiritual Care consult 5. Provided support to both patient and Michael Heath and we talked about their strengths as a couple for 567years and did some planning about future concerns if he were not to survive this. Maintain DNR. 6. I additionally discussed the swallowing/feeding tube situation- this deserves a very careful conversation  about future trajectory and QOL.  We will continue to follow.  Michael Hacker DO Palliative Medicine [phone] Time: 35 min

## 2016-09-22 NOTE — Progress Notes (Signed)
Pt return to vent per MD Tyson AliasFeinstein for A-fib/RVR. Pt is tachycardic rate in the 120-130's. No active distress noted. Cuff inflated. Patient on vent, on previous settings per MD verbal order. Pt is stable at this time. RN at bedside.

## 2016-09-22 NOTE — Progress Notes (Signed)
Nutrition Follow Up  DOCUMENTATION CODES:   Not applicable  INTERVENTION:    Continue Vital 1.5 formula at 20 ml/hr and increase by 10 ml every 4 hours to goal rate of 50 ml/hr   Continue Prostat liquid protein 60 ml BID   Total TF regimen to provide 2200 kcals, 141 gm protein, 917 ml of free water  NUTRITION DIAGNOSIS:   Inadequate oral intake related to inability to eat as evidenced by NPO status, ongoing  GOAL:   Patient will meet greater than or equal to 90% of their needs, met  MONITOR:   TF tolerance, Vent status, Labs, Weight trends, Skin, I & O's  ASSESSMENT:   [name] wiht PMH of HTN, HLD, CKD, stage III. Admitted with right flank, back pain. A CTA which showed ruptured AAA. He was transferred emergently to [facility] for stent repair.    Pt s/p procedures 2/13: REPAIR OF RUPTURED ABDOMINAL AORTIC ANEURYSM EVACUATION OF RETROPERITONEAL HEMATOMA PLACEMENT OF ABDOMINAL WOUND VAC  S/p bedside trach insertion 3/1 >> currently on trach collar. 2nd [place]field feeding tube placed 2/28 (tip in pylorus or first part of the duodenum). Vital 1.5 formula currently infusing at 50 ml/hr. Plan for FEES today per Speech Path. CBG's [id number].  Pt having CORGRIP placed to prevent further removal or displacement of feeding tube.  Diet Order:  Diet NPO time specified  Skin:  Reviewed, no issues  Last BM:  3/5  Height:   Ht Readings from Last 1 Encounters:  09/15/16 _0  (1.854 m)    Weight:   Wt Readings from Last 1 Encounters:  09/22/16 233 lb 14.5 oz (106.1 kg)  Admit wt         222 lb (100.7 kg)  Ideal Body Weight:  83.6 kg  BMI:  Body mass index is 30.86 kg/m.  Estimated Nutritional Needs:   Kcal:  2200-2400  Protein:  140-150 gm  Fluid:  per MD  EDUCATION NEEDS:   No education needs identified at this time  Arthur Holms, RD, LDN Pager #: [phone] After-Hours Pager #: [phone]

## 2016-09-22 NOTE — Plan of Care (Signed)
Problem: Activity: Goal: Ability to return to normal activity level will improve Outcome: Progressing P.T. consulted for activity progression. Able to pivot Pt to bedside sitting position. Transitioning to Maxi-lift OOB to chair.  Problem: Respiratory: Goal: Ability to achieve and maintain a regular respiratory rate will improve Outcome: Progressing Pt able to tolerate 24 hrs off ventilator on 30 % trach collar without increase in W.O.B. or secretions.

## 2016-09-22 NOTE — Progress Notes (Signed)
eLink Physician-Brief Progress Note Patient Name: Michael AmenDavid T Balding DOB: 12-09-1945 MRN: [id number]   Date of Service  09/22/2016  HPI/Events of Note  Fib rvr remains  eICU Interventions  Iv BB, amio bolus and IV k supp Mag, phos assessment Dc oral amio for now     Intervention Category Major Interventions: Arrhythmia - evaluation and management  FEINSTEIN,DANIEL J. 09/22/2016, 3:28 PM

## 2016-09-22 NOTE — Care Management Note (Signed)
Case Management Note  Patient Details  Name: Michael Heath MRN: [id number] Date of Birth: 06-30-1946  Subjective/Objective:   Pt is on 30% TC, OOB to chair.  Requested LTAC liaisons assess for eligibility - discussed with wife - explained that insurance would need to approve.  CSW will initiate bed search, also discussed this with wife who agrees to either option.                        Expected Discharge Plan:  Skilled Nursing Facility  In-House Referral:  Clinical Social Work  Discharge planning Services  CM Consult  Status of Service:  In process, will continue to follow  Magdalene RiverMayo, Kawanna Christley T, RN 09/22/2016, 9:28 AM

## 2016-09-22 NOTE — Progress Notes (Signed)
Physical Therapy Treatment Patient Details Name: Michael Heath MRN: [id number] DOB: September 11, 1945 Today's Date: 09/22/2016    History of Present Illness Patient is a 71 yo male admitted 09/17/2016 with Rt flank pain.  Patient with ruptured AAA, now s/p repair.  Post-op retroperitoneal bleed with abd compartment syndrome.  Post-op Afib and acute hypoxic resp failure, intubated 08/02/16.  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  Extubated 09/12/16.    PMH:  CAD, CKD, HTN, HLD. pt now with trach and peg.    PT Comments    Pt more awake, alert, and participatory with therapy now that he has a trach and peg. Pt completed 2 standing episodes. HR however went up into 150s s/p 30-60 seconds. SpO2 >89% via trach collar. Pt continues to demo severe deconditioning, noted R sided weakness, and increased HR with all mobility. Acute PT to con't to follow.   Follow Up Recommendations  LTACH     Equipment Recommendations   (defer)    Recommendations for Other Services OT consult     Precautions / Restrictions Precautions Precautions: Fall Precaution Comments: R weakness Restrictions Weight Bearing Restrictions: No    Mobility  Bed Mobility               General bed mobility comments: pt up in chair  Transfers Overall transfer level: Needs assistance Equipment used: 2 person hand held assist (with bed pad and eva walker) Transfers: Sit to/from Stand Sit to Stand: Max assist;+2 physical assistance         General transfer comment: complete sit to stand x 2 trials. Pt requires maxAx2 for initial power up and then assist to bring UEs up to EVA walker. pt able to maintain standing once in walker with maxAt posterior hips. Pt with strong L Lateral lean and noted R sided weakness  Ambulation/Gait             General Gait Details: unable to ambulate at this time   Stairs            Wheelchair Mobility    Modified Rankin (Stroke Patients Only) Modified  Rankin (Stroke Patients Only) Pre-Morbid Rankin Score: No symptoms Modified Rankin: Severe disability     Balance Overall balance assessment: Needs assistance Sitting-balance support: Bilateral upper extremity supported;Feet supported Sitting balance-Leahy Scale: Poor Sitting balance - Comments: pt unable to maintain midline or upright without modA posteriorly. pt with L lateral lean Postural control: Posterior lean;Left lateral lean                          Cognition Arousal/Alertness: Awake/alert Behavior During Therapy: WFL for tasks assessed/performed Overall Cognitive Status: Within Functional Limits for tasks assessed                 General Comments: pt much improved from last week    Exercises General Exercises - Lower Extremity Quad Sets: AROM;Right;10 reps;Seated (with legs elevated) Long Arc Quad: Both;10 reps;Seated    General Comments        Pertinent Vitals/Pain Pain Assessment: No/denies pain    Home Living                      Prior Function            PT Goals (current goals can now be found in the care plan section) Acute Rehab PT Goals Patient Stated Goal: i want water Progress towards PT goals: Progressing toward goals  Frequency    Min 3X/week      PT Plan Discharge plan needs to be updated    Co-evaluation PT/OT/SLP Co-Evaluation/Treatment: Yes Reason for Co-Treatment: Complexity of the patient's impairments (multi-system involvement);Necessary to address cognition/behavior during functional activity;For patient/therapist safety PT goals addressed during session: Mobility/safety with mobility       End of Session Equipment Utilized During Treatment: Gait belt;Oxygen (via trach) Activity Tolerance: Patient tolerated treatment well (limtied by increased HR) Patient left: in chair;with call bell/phone within reach;with nursing/sitter in room Nurse Communication: Mobility status PT Visit Diagnosis:  Difficulty in walking, not elsewhere classified (R26.2);Muscle weakness (generalized) (M62.81);Hemiplegia and hemiparesis Hemiplegia - Right/Left: Right Hemiplegia - dominant/non-dominant: Non-dominant Hemiplegia - caused by: Cerebral infarction     Time: [id number] PT Time Calculation (min) (ACUTE ONLY): 33 min  Charges:  $Therapeutic Activity: 8-22 mins                    G Codes:       Ferrah Panagopoulos M Nyilah Kight 09/22/2016, 11:24 AM   Lewis ShockAshly Hilliard Borges, PT, DPT Pager #: [phone] Office #: [phone]

## 2016-09-23 DIAGNOSIS — Z430 Encounter for attention to tracheostomy: Secondary | ICD-10-CM

## 2016-09-23 LAB — CBC WITH DIFFERENTIAL/PLATELET
BASOS PCT: 0 %
Basophils Absolute: 0 10*3/uL (ref 0.0–0.1)
EOS PCT: 1 %
Eosinophils Absolute: 0.2 10*3/uL (ref 0.0–0.7)
HEMATOCRIT: 27.8 % — AB (ref 39.0–52.0)
HEMOGLOBIN: 8.5 g/dL — AB (ref 13.0–17.0)
Lymphocytes Relative: 11 %
Lymphs Abs: 1.9 10*3/uL (ref 0.7–4.0)
MCH: 31.6 pg (ref 26.0–34.0)
MCHC: 30.6 g/dL (ref 30.0–36.0)
MCV: 103.3 fL — AB (ref 78.0–100.0)
MONOS PCT: 11 %
Monocytes Absolute: 1.9 10*3/uL — ABNORMAL HIGH (ref 0.1–1.0)
NEUTROS PCT: 77 %
Neutro Abs: 12.9 10*3/uL — ABNORMAL HIGH (ref 1.7–7.7)
Platelets: 211 10*3/uL (ref 150–400)
RBC: 2.69 MIL/uL — ABNORMAL LOW (ref 4.22–5.81)
RDW: 22.1 % — ABNORMAL HIGH (ref 11.5–15.5)
WBC: 16.9 10*3/uL — AB (ref 4.0–10.5)

## 2016-09-23 LAB — HEPARIN LEVEL (UNFRACTIONATED): HEPARIN UNFRACTIONATED: 0.48 [IU]/mL (ref 0.30–0.70)

## 2016-09-23 LAB — PHOSPHORUS: PHOSPHORUS: 2.8 mg/dL (ref 2.5–4.6)

## 2016-09-23 LAB — BASIC METABOLIC PANEL
ANION GAP: 4 — AB (ref 5–15)
BUN: 34 mg/dL — AB (ref 6–20)
CO2: 21 mmol/L — ABNORMAL LOW (ref 22–32)
Calcium: 7.5 mg/dL — ABNORMAL LOW (ref 8.9–10.3)
Chloride: 120 mmol/L — ABNORMAL HIGH (ref 101–111)
Creatinine, Ser: 1.09 mg/dL (ref 0.61–1.24)
Glucose, Bld: 148 mg/dL — ABNORMAL HIGH (ref 65–99)
POTASSIUM: 3.2 mmol/L — AB (ref 3.5–5.1)
SODIUM: 145 mmol/L (ref 135–145)

## 2016-09-23 LAB — MAGNESIUM: Magnesium: 1.9 mg/dL (ref 1.7–2.4)

## 2016-09-23 LAB — ALBUMIN: ALBUMIN: 1.7 g/dL — AB (ref 3.5–5.0)

## 2016-09-23 LAB — GLUCOSE, CAPILLARY
GLUCOSE-CAPILLARY: 121 mg/dL — AB (ref 65–99)
GLUCOSE-CAPILLARY: 132 mg/dL — AB (ref 65–99)
GLUCOSE-CAPILLARY: 125 mg/dL — AB (ref 65–99)

## 2016-09-23 MED ORDER — ALBUTEROL SULFATE (2.5 MG/3ML) 0.083% IN NEBU: 2.5000 mg | INHALATION_SOLUTION | RESPIRATORY_TRACT | Status: DC | PRN

## 2016-09-23 MED ORDER — IPRATROPIUM-ALBUTEROL 0.5-2.5 (3) MG/3ML IN SOLN
3.0000 mL | Freq: Three times a day (TID) | RESPIRATORY_TRACT | Status: DC
  Filled 2016-09-23: qty 3

## 2016-09-23 MED ORDER — HALOPERIDOL LACTATE 2 MG/ML PO CONC
0.5000 mg | ORAL | Status: DC | PRN
  Filled 2016-09-23: qty 0.3

## 2016-09-23 MED ORDER — ONDANSETRON 4 MG PO TBDP
4.0000 mg | ORAL_TABLET | Freq: Four times a day (QID) | ORAL | Status: DC | PRN
  Filled 2016-09-23: qty 1

## 2016-09-23 MED ORDER — GLYCOPYRROLATE 1 MG PO TABS
1.0000 mg | ORAL_TABLET | ORAL | Status: DC | PRN
  Filled 2016-09-23: qty 1

## 2016-09-23 MED ORDER — HYDROMORPHONE HCL 2 MG/ML IJ SOLN
0.5000 mg | INTRAMUSCULAR | Status: DC | PRN
  Administered 2016-09-24 (×4): 0.5 mg via INTRAVENOUS
  Filled 2016-09-23 (×4): qty 1

## 2016-09-23 MED ORDER — BIOTENE DRY MOUTH MT LIQD: 15.0000 mL | OROMUCOSAL | Status: DC | PRN

## 2016-09-23 MED ORDER — GLYCOPYRROLATE 0.2 MG/ML IJ SOLN
0.2000 mg | INTRAMUSCULAR | Status: DC | PRN
  Filled 2016-09-23 (×2): qty 1

## 2016-09-23 MED ORDER — POTASSIUM CHLORIDE 20 MEQ/15ML (10%) PO SOLN
30.0000 meq | ORAL | Status: AC
  Administered 2016-09-23 (×2): 30 meq
  Filled 2016-09-23 (×2): qty 30

## 2016-09-23 MED ORDER — ONDANSETRON HCL 4 MG/2ML IJ SOLN: 4.0000 mg | Freq: Four times a day (QID) | INTRAMUSCULAR | Status: DC | PRN

## 2016-09-23 MED ORDER — HALOPERIDOL LACTATE 5 MG/ML IJ SOLN: 0.5000 mg | INTRAMUSCULAR | Status: DC | PRN

## 2016-09-23 MED ORDER — POLYVINYL ALCOHOL 1.4 % OP SOLN
1.0000 [drp] | Freq: Four times a day (QID) | OPHTHALMIC | Status: DC | PRN
  Administered 2016-09-24: 1 [drp] via OPHTHALMIC
  Filled 2016-09-23: qty 15

## 2016-09-23 MED ORDER — APIXABAN 5 MG PO TABS: 5.0000 mg | ORAL_TABLET | Freq: Two times a day (BID) | ORAL | Status: DC

## 2016-09-23 MED ORDER — MAGNESIUM SULFATE 2 GM/50ML IV SOLN
2.0000 g | Freq: Once | INTRAVENOUS | Status: AC
  Administered 2016-09-23: 2 g via INTRAVENOUS
  Filled 2016-09-23: qty 50

## 2016-09-23 MED ORDER — HALOPERIDOL 1 MG PO TABS: 0.5000 mg | ORAL_TABLET | ORAL | Status: DC | PRN

## 2016-09-23 MED ORDER — AMIODARONE HCL 200 MG PO TABS: 200.0000 mg | ORAL_TABLET | Freq: Two times a day (BID) | ORAL | Status: DC

## 2016-09-23 MED ORDER — GLYCOPYRROLATE 0.2 MG/ML IJ SOLN
0.2000 mg | INTRAMUSCULAR | Status: DC | PRN
  Administered 2016-09-24 (×4): 0.2 mg via INTRAVENOUS
  Filled 2016-09-23 (×5): qty 1

## 2016-09-23 MED ORDER — IPRATROPIUM-ALBUTEROL 0.5-2.5 (3) MG/3ML IN SOLN
3.0000 mL | Freq: Four times a day (QID) | RESPIRATORY_TRACT | Status: DC
  Administered 2016-09-23 (×2): 3 mL via RESPIRATORY_TRACT
  Filled 2016-09-23: qty 3

## 2016-09-23 MED ORDER — WHITE PETROLATUM GEL
Status: AC
  Administered 2016-09-23: 1
  Filled 2016-09-23: qty 1

## 2016-09-23 NOTE — Progress Notes (Signed)
Patient transferred to 6N15 with family at bedside, patient belongings brought with him, chart given to Secretary, VSS, and receiving RN at bedside.    Michael PorchBradley [name]

## 2016-09-23 NOTE — Care Management Note (Signed)
Case Management Note  Patient Details  Name: QUINT CHESTNUT MRN: [id number] Date of Birth: 1946-06-02  Subjective/Objective:    CM met with wife following SLP eval and need for PEG tube.  She said pt had indicated to her that he wanted to be rid of all the hospital equipment because he did not want to continue treatment.  CM contacted Dr Hilma Favors at her request as wife felt she would be able to determine best next step for pt under the current circumstances.  Following their meeting, wife stated she planned to contact their minister to request a visit tomorrow, declined chaplain visit.  CM requested CSW determine closest residential hospice facility near their home in the event one was needed.              In-House Referral:  Clinical Social Work  Ship broker  CM Consult  Status of Service:  In process, will continue to follow  Girard Cooter, RN 09/23/2016, 3:24 PM

## 2016-09-23 NOTE — Progress Notes (Signed)
Pt is actively transferring to 6N15. New ATC set up prior to leaving, instructions given to RN/NT on hooking patient oxygen source up on arrival to new bed. Portable oxygen is given to patient via venti set up to trach collar. No distress noted. Michael Heath supplies all taking with patient. PMV given to patient family. Family walking with patient to new bed.

## 2016-09-23 NOTE — Progress Notes (Addendum)
Extensive discussion with patient on PMV along with his wife and family. He has initiated a discussion about his future-he does not desire to continue along an aggressive intervention trajectory given results of his swallow evaluation. 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 Feeding tube would not prevent aspiration but it would provide a route for medications and supplemental nutrition. We discussed this at length- he told me that he did not want another tube put in his body that he had "had enough". He acknowledged he had come so far but did not think he wanted the QOL that was ahead. He and his wife had a meaningful exchange and he told me "decision made" he knows it may only be a matter of days.  I discussed this transition with Dr. Myra GianottiBrabham, Pharmacy and also with SLP in detail.  Will attempt PO meds-liquid or crushed with puree.  Amiodarone 200mg  BID, Metoprolol 5mg  TID and Eloquis- for now.  Allow for rest and comfort- this has been a long road for him and I have tried to re-frame courage for him so that this very difficult decision he has made is honored and that the end of his life can be on his own terms.He expressed gratitude for the care he has been provided and opportunity to have this closure with his family. Will continue to support in next steps.  For comfort: Hydromorphone q1 PRN Scheduled [facility] Pall[facility] order set  Time: 120 minutes Time In: 12:30PM Time Out: 2:30PM Prolonged Time: 1:05PM-2:30PM Greater than 50%  of this time was spent counseling and coordinating care related to the above assessment and plan.   Michael MaltaElizabeth Dezman Granda, DO Palliative Medicine [phone]

## 2016-09-23 NOTE — Progress Notes (Signed)
Walked down from palliative care pt review with Dr. Phillips OdorGolding. Informed her of my conversation w/ pt's wife yesterday (before, during, and after results of swallow test were explained to her by speech therapist) -- after which pt's wife had then discussed her thoughts (as far as she could yet discern them) about what should now be pt's GOC.   Today showed Dr. Phillips OdorGolding to pt's wife's corner "set-up" in 2S waiting rm. Encountered pt's wife there, and remained present for them in initial conversation on pt's GOC options. Wife asked chaplain to f/u w/ pt after Dr. Phillips OdorGolding conversed with pt as to his current GOC desires. Chaplain available for f/u.    09/23/16 1200  Clinical Encounter Type  Visited With Family;Health care provider  Visit Type Follow-up;Psychological support;Spiritual support;Social support;Critical Care  Referral From Chaplain  Spiritual Encounters  Spiritual Needs Emotional;Grief support  Stress Factors  Patient Stress Factors Health changes;Loss of control;Major life changes  Family Stress Factors Family relationships;Health changes;Loss of control;Major life changes   Ephraim Hamburgerynthia A Mahathi Pokorney, [address] Hospital Roadhaplain

## 2016-09-23 NOTE — Progress Notes (Signed)
Pt placed back on ATC per pt request. Pt is stable at this time. No tachycardia or increase WOB noted. No signs of distress noted. RN aware. PMV placed.

## 2016-09-23 NOTE — Progress Notes (Signed)
ANTICOAGULATION CONSULT NOTE - Follow Up Consult  Pharmacy Consult for Heparin Indication: atrial fibrillation and stroke  No Known Allergies  Patient Measurements: Height: 6\' 1"  (185.4 cm) Weight: 234 lb 9.1 oz (106.4 kg) IBW/kg (Calculated) : 79.9 Heparin Dosing Weight: 102 kg  Vital Signs: Temp: 98 F (36.7 C) (03/06 0720) Temp Source: Oral (03/06 0720) BP: 138/74 (03/06 1100) Pulse Rate: 76 (03/06 1100)  Labs:  Recent Labs  09/21/16 0502 09/22/16 0410 09/23/16 0335 09/23/16 0500  HGB 8.7* 8.7* 8.5*  --   HCT 28.3* 29.4* 27.8*  --   PLT 240 235 211  --   HEPARINUNFRC 0.41 0.45  --  0.48  CREATININE 1.34* 1.24 1.09  --     Estimated Creatinine Clearance: 79.6 mL/min (by C-G formula based on SCr of 1.09 mg/dL).  Assessment:   Heparin level remains therapeutic (0.48), and at lower end of usual target range for afib and CVA. Hx recent ruptured AAA and large retroperitoneal hematoma (10/16/16).    On IV heparin since 09/14/16, except for interruption 3/1 for trach, resumed 3/2.    CBC low stable.  No bleeding noted.  Goal of Therapy:  Heparin level 0.3-0.5 units/ml Monitor platelets by anticoagulation protocol: Yes   Plan:   Continue heparin drip at 1550 units/hr.  Daily heparin level and CBC.  Monitor for any bleeding.  Michael Heath, Michael Heath, RPh Pager: [phone] 09/23/2016,11:57 AM

## 2016-09-23 NOTE — Progress Notes (Signed)
Received patient from 4E accompanied by RN, CNA and daughter-in-law.  Patient AOx4, denies any pain, VS stable with slightly elevated BP.  Patient/family refused any PO intakes d/t high risk of aspiration, but had oral care.  Family members then came to see patient as soon as patient got situated in room.  Will monitor.

## 2016-09-23 NOTE — Progress Notes (Signed)
Speech Pathology:  Spoke with Dr. Phillips OdorGolding re: Mr. Michaelle CopasSmith's wishes. Rec use of PMV as often as pt desires; remove during sleep.  Use PMV with any PO intake to restore pressures for cough, improve sensation.  Our services will sign off at this time.  Thank you, Gerron Guidotti L. Samson Fredericouture, [place] CCC/SLP Pager [phone]

## 2016-09-23 NOTE — Progress Notes (Signed)
Patient placed on 35% ATC, cuff deflated and PMV placed per palliative care team for patient to be able to voice his opinion of his care.

## 2016-09-23 NOTE — Progress Notes (Signed)
[facility] LAB REPLACEMENT ONLY  The patient does apply for the [facility] Electrolyte [facility] Protocol based on the criteria listed below:   1. Is GFR >/= 40 ml/min? Yes.    Patient's GFR today is >60 2. Is urine output >/= 0.5 ml/kg/hr for the last 6 hours? Yes.   Patient's UOP is 1.9 ml/kg/hr 3. Is BUN < 60 mg/dL? Yes.    Patient's BUN today is 34 4. Abnormal electrolyte(s): K3.2, Mg 1.8 5. Ordered repletion with: per protocol 6. If a panic level lab has been reported, has the CCM MD in charge been notified? Yes.  .   Physician:  Langston Masker Byrum, MD  Melrose NakayamaChisholm, Javon Snee William 09/23/2016 4:44 AM

## 2016-09-23 NOTE — Progress Notes (Signed)
AAA Progress Note    09/23/2016 9:36 AM 21/20 Days Post-Op  Subjective:  Asleep and awakes easily; follows commands; denies abdominal pain  Tm 99.5 now afebrile HR  60's-130's (afib to NSR after amio gtt restarted) 130's-180's systolic 99% .30FiO2 Vent  Vitals:   09/23/16 0700 09/23/16 0720  BP: (!) 162/89 (!) 162/89  Pulse: 75 76  Resp: (!) 28 (!) 32  Temp:  98 F (36.7 C)    Physical Exam: Cardiac:  regular Lungs:  Decreased BS at bases bilaterally Abdomen:  Soft, NT/ND Incisions:  Laparotomy incision is healing nicely Extremities:  Easily palpable bilateral DP pulses Neuro:  Right hand grip stronger and can raise arm off the bed; moving other extremities equally.    CBC    Component Value Date/Time   WBC 16.9 (H) 09/23/2016 0335   RBC 2.69 (L) 09/23/2016 0335   HGB 8.5 (L) 09/23/2016 0335   HCT 27.8 (L) 09/23/2016 0335   HCT 47.6 05/01/2016 1001   PLT 211 09/23/2016 0335   PLT 230 05/01/2016 1001   MCV 103.3 (H) 09/23/2016 0335   MCV 93 05/01/2016 1001   MCH 31.6 09/23/2016 0335   MCHC 30.6 09/23/2016 0335   RDW 22.1 (H) 09/23/2016 0335   RDW 13.0 05/01/2016 1001   LYMPHSABS 1.9 09/23/2016 0335   LYMPHSABS 2.6 05/01/2016 1001   MONOABS 1.9 (H) 09/23/2016 0335   EOSABS 0.2 09/23/2016 0335   EOSABS 0.6 (H) 05/01/2016 1001   BASOSABS 0.0 09/23/2016 0335   BASOSABS 0.1 05/01/2016 1001    BMET    Component Value Date/Time   NA 145 09/23/2016 0335   NA 134 08/25/2016 1335   K 3.2 (L) 09/23/2016 0335   CL 120 (H) 09/23/2016 0335   CO2 21 (L) 09/23/2016 0335   GLUCOSE 148 (H) 09/23/2016 0335   BUN 34 (H) 09/23/2016 0335   BUN 26 08/25/2016 1335   CREATININE 1.09 09/23/2016 0335   CALCIUM 7.5 (L) 09/23/2016 0335   GFRNONAA >60 09/23/2016 0335   GFRAA >60 09/23/2016 0335    INR    Component Value Date/Time   INR 1.25 09/17/2016 1546     Intake/Output Summary (Last 24 hours) at 09/23/16 0936 Last data filed at 09/23/16 0800  Gross per 24 hour   Intake           2749.5 ml  Output             2025 ml  Net            724.5 ml     Assessment/Plan:  71 y.o. 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  Pt is on heparin.  Will d/w Dr. Myra Gianotti about starting coumadin Pulmonary:  Resting on vent today; increased secretions and O2 requirements.  Possible HCAP.  Cx are negative; holding on Abx for now. Neuro:  CVA--RUE still weak but appears to be improving.  Continue PT/OT GI:  Tolerating TF.  Pt may require PEG tube in future.  Swallow evaluation yesterday and severe aspiration risk.  " Cervical osteophytes and protrusion  of posterior pharyngeal wall in initial image. SLP noted during examination pt with unilateral posterior pharyngeal protrusion which impeded epiglottic deflection and airway closure, leading to severe residue in the valleculae and posterior pharyngeal wall with all consistencies (thin, honey-thick liquids and pureed), unilateral bolus flow, penetration/aspiration during and after the swallow. "  Realistically, pt needs a PEG tube. Mentioned it to pt and wife -appreciate palliative, CCM and cardiology management assistance with this pt.   Doreatha MassedSamantha Rhyne, PA-C [facility] [phone] 09/23/2016 9:36 AM   I agree with the above.  I have seen and examined the patient.  He is on TC and using speaking valve.  He has had extensive discussions with Palliative care and his family, and has elected for  comfort measures.  Will transition to palliation.  Durene CalWells Brabham

## 2016-09-23 NOTE — Progress Notes (Signed)
[facility]. Will transfer patient soon.    Michael PorchBradley Kadar Heath

## 2016-09-23 NOTE — Progress Notes (Signed)
[facility] Attending Note  Presenting HPI:  71 y.o. male with past medical history of hypertension, hyperlipidemia, CKD, stage III. Admitted 2/13 with right flank, back pain. A CTA which showed ruptured AAA. He was transferred emergently to [facility] for stent repair.,required massive tr ansfusion , developedabdominal comparment syndrome in OR from massive RP bleed. COurse complicated by  open abdomen, prolonged ventilation, tstomy & acute CVA ?embolic  IMAGING/STUDIES: CTA CHEST/ABD/PELVIS 2/13:  Acutely ruptured 7.8 cm infrarenal abdominal aortic aneurysm with a large right retroperitoneal hematoma. Moderate pulmonary emphysema. Small hiatal hernia. Incidental renal cysts. Colonic diverticulosis. Cholelithiasis. TTE 2/15: LV normal in size with mild concentric hypertrophy. EF 60-65% with normal regional wall motion. Grade 1 diastolic dysfunction without evidence of elevated filling pressure. LA mildly dilated & RA normal in size. RV normal in size and function. No aortic stenosis or regurgitation. Aortic root normal in size. No mitral stenosis or regurgitation. Pulmonic valve poorly visualized but without stenosis. Trivial tricuspid regurgitation. No pericardial effusion. MRI BRAIN W/O 2/20:  Multifocal acute ischemia, including the left frontal watershed region, possibly secondary to an acute hypotensive episode. The other scattered punctate foci of ischemia are more suggestive of a central cardio embolic process. No acute hemorrhage or mass effect. MRI C-SPINE 2/20:  C5-C6 disc osteophyte complex causing severe spinal canal stenosis and severe left, moderate right foraminal narrowing. Mild narrowing at the right C4 and left C5 neural foramina. VENOUS DUPLEX RUE 2/20:  Findings consistent with acute superficial vein thrombosis involving the right cephalic vein. No evidence of deep vein thrombosis involving the right upper extremity. No evidence of deep vein or superficial thrombosis  involving the left upper extremity. BILATERAL CAROTID U/S 2/21:  The vertebral arteries appear patent with antegrade flow. Findings consistent with a 1- 39% stenosis involving the. right internal carotid artery and the left internal carotid artery.  MICROBIOLOGY: MRSA PCR 2/13: Negative Blood Cultures x2 2/15:  Negative  Tracheal Aspirate Culture 2/14: Normal respiratory flora Blood Cultures x2 2/25 >>>ng Tracheal Aspirate Culture 2/27:  Rare Candida albicans Stool C Diff 2/27:  Toxin & Antigen Positive  ANTIBIOTICS: Zinacef 2/14 (periop prophylaxis) Zosyn 2/15 - 2/22 Vancomycin 2/15 - 2/28 Flagyl via tube 2/27 - 2/28 Cefepime 2/22 - 3/2 Vancomycin PO 2/28 >>>  SIGNIFICANT EVENTS: 2/13 - Admit, OR for ruptured AAA. 2/14 - Back to OR for abdomina closure.  2/20 - Found to have superficial venous thrombus right upper extremity 2/21 - A fib w/ RVR started on Diltiazem 2/23 - Extubated to BiPAP 2/25 - Started on Amiodarone drip after bolus for A fib w/ RVR. Febrile>>recultured 2/28 - Transitioned from Amiodarone drip to Amiodarone via tube 3/01 - Trach placed by JY at bedside holding heparin drip for 12 hours prior 3/2 trach collar, PMV 3/4 ATC 24/7  Subjective:   3/5 was a setback on the pt.  He went into afib RVR, started on IV amiodarone.  Was also placed on the vent for increased WOB.  Still with diarrhea.  Looks comfortable on the vent.    Vent Mode: PRVC FiO2 (%):  [30 %-40 %] 30 % Set Rate:  [22 bmp] 22 bmp Vt Set:  [640 mL] 640 mL PEEP:  [5 cmH20] 5 cmH20 Plateau Pressure:  [14 cmH20-21 cmH20] 17 cmH20  Temp:  [98 F (36.7 C)-99.5 F (37.5 C)] 98 F (36.7 C) (03/06 0720) Pulse Rate:  [66-143] 76 (03/06 0720) Resp:  [19-34] 32 (03/06 0720) BP: (134-188)/(67-115) 162/89 (03/06 0720) SpO2:  [  92 %-100 %] 99 % (03/06 0720) FiO2 (%):  [30 %-40 %] 30 % (03/06 0720) Weight:  [106.4 kg (234 lb 9.1 oz)] 106.4 kg (234 lb 9.1 oz) (03/06 0600)   Gen.: Awake. No  distress. Chr ill appearing. Comfortable. On the vent Integument: Warm and dry. No rash on exposed skin. No bleeding around tracheostomy stoma. HEENT: Moist mucous membranes. #6 cuffed tracheostomy in place. No scleral icterus. Cardiovascular: Regular rate and rhythm. Sinus rhythm on telemetry. Gr 2 edema.  Pulmonary: Overall clear with auscultation with some bibasilar crackles.  Normal work of breathing with tracheostomy on ventilator. Abdomen: Soft. Protuberant. Normal bowel sounds. Neurological: Following commands. Moving all 4 extremities. RUE 3/5  LINES/TUBES: OGT 2/13 - 2/23 R IJ CORDIS 2/13 - Out (? Date) OETT 8.0 2/13 - 2/23; 8.0 2/26 - 3/1 Perc Trach #6 Cuffed (JY) 3/1 >>> LUE DL PICC 2/20 >>> NGT 2/26 >>> Foley 2/13 >>> PIV  CBC Latest Ref Rng & Units 09/23/2016 09/22/2016 09/21/2016  WBC 4.0 - 10.5 K/uL 16.9(H) 16.7(H) 17.5(H)  Hemoglobin 13.0 - 17.0 g/dL 8.5(L) 8.7(L) 8.7(L)  Hematocrit 39.0 - 52.0 % 27.8(L) 29.4(L) 28.3(L)  Platelets 150 - 400 K/uL 211 235 240    BMP Latest Ref Rng & Units 09/23/2016 09/22/2016 09/21/2016  Glucose 65 - 99 mg/dL 148(H) 157(H) 153(H)  BUN 6 - 20 mg/dL 34(H) 42(H) 42(H)  Creatinine 0.61 - 1.24 mg/dL 1.09 1.24 1.34(H)  BUN/Creat Ratio 10 - 24 - - -  Sodium 135 - 145 mmol/L 145 144 146(H)  Potassium 3.5 - 5.1 mmol/L 3.2(L) 3.4(L) 3.6  Chloride 101 - 111 mmol/L 120(H) 118(H) 116(H)  CO2 22 - 32 mmol/L 21(L) 23 22  Calcium 8.9 - 10.3 mg/dL 7.5(L) 8.3(L) 8.3(L)   Hepatic Function Latest Ref Rng & Units 09/23/2016 09/22/2016 09/21/2016  Total Protein 6.5 - 8.1 g/dL - - -  Albumin 3.5 - 5.0 g/dL 1.7(L) 1.7(L) 1.8(L)  AST 15 - 41 U/L - - -  ALT 17 - 63 U/L - - -  Alk Phosphatase 38 - 126 U/L - - -  Total Bilirubin 0.3 - 1.2 mg/dL - - -  Bilirubin, Direct 0.1 - 0.5 mg/dL - - -      ASSESSMENT/PLAN:   71 y.o. male with past medical history of hypertension, hyperlipidemia, CKD, stage III. Admitted 2/13 with right flank, back pain. A CTA which showed  ruptured AAA. He was transferred emergently to [facility] for stent repair.,required massive tr ansfusion, developedabdominal comparment syndrome in OR from massive RP bleed. Course complicated by  open abdomen, prolonged ventilation, tstomy & acute CVA ?embolic.   Patient was off the ventilator this weekend and went into rapid afib and resp distress on 3/5.  Improved now.   1. Acute hypoxic respiratory failure s/p Tstomy : Cont vent for the day for now.  Eventually, I think he will be a nocturnal vent as he remains deconditioned.  2. Atrial fibrillation: Amiodarone drip reseumed on 3/5. Plan to switch to PO in am. Ct heparin drip. Will defer to Cards and Surgery re: switching to PO meds.  3. 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 4. Sepsis: Secondary to C. difficile colitis. Off IV ABx, PO vanc only. Continuing enteric precautions. Observe diarrhea.  5. Possible healthcare associated pneumonia: No evidence of infection on repeat tracheostomy culture. (-) increase in secretions and o2 requirement.  Cultures remain (-).  Observe off abx for now.  6. Abdominal aortic  aneurysm rupture: Status post repair. Postoperative management per vascular surgery. 7. Anemia:  No evidence of active bleeding. Trending cell counts daily with CBC while on systemic anticoagulation. 8. Acute renal failure: Stable. Continuing to trend urine output with Foley catheter. Monitoring electrolytes and renal function daily. Autodiurescing well.  9. Prolonged QTc:  telemetry. 10. Right upper extremity superficial venous thrombosis: Noted on vascular ultrasound. Keeping blood pressure cuff off that arm. 11. Right carotid artery stenosis: Mild. Resuming anticoagulation. 12. Delirium: Multifactorial.   Remeron at HS and prn versed and fentanyl IV pushes while on the vent.  13. Nutrition. MBS was done on 3/5 : In report " Cervical osteophytes and protrusion of posterior pharyngeal  wall in initial image. SLP noted during examination pt with unilateral posterior pharyngeal protrusion which impeded epiglottic deflection and airway closure, leading to severe residue in the valleculae and posterior pharyngeal wall with all consistencies (thin, honey-thick liquids and pureed), unilateral bolus flow, penetration/aspiration during and after the swallow. "  Realistically, pt needs a PEG tube. Mentioned it to pt and wife.   Prophylaxis:  heparin drip. Continuing SCDs & Pepcid via tube BID. Diet:  tube feedings. Plan for swallow evaln today.  Code Status: Limited code as per family discussion yesterday. Disposition: ?LTAC vs SNF evetually.  Spoke with case manager today.  Family Update: Wife updated  3/6.   Over all plan : Palliative Care has been following pt and wife and has been discussing [place] with them.  Pt does not want to go to SNF or rehab.  I will  defer [place] conversations with palliative Care.  If pt wants to go home,  he will probably need a PEG and a nocturnal vent.  Wife will need extra help with taking care of the pt. Pt is OK with a PEG tube.     I spent  30 minutes of Critical Care time with this patient today.   Monica Becton, MD 09/23/2016, 9:39 AM [facility] Pager [phone] After 3 pm or if no answer, call [phone]

## 2016-09-24 MED ORDER — SODIUM CHLORIDE 0.9 % IV SOLN
INTRAVENOUS | Status: DC
  Administered 2016-09-24: 17:00:00 via INTRAVENOUS

## 2016-09-24 MED ORDER — SODIUM CHLORIDE 0.9 % IV SOLN
1.0000 mg/h | INTRAVENOUS | Status: DC
  Administered 2016-09-24: 1 mg/h via INTRAVENOUS
  Filled 2016-09-24 (×2): qty 2.5

## 2016-09-24 MED ORDER — HYDROMORPHONE BOLUS VIA INFUSION
0.5000 mg | INTRAVENOUS | Status: DC | PRN
  Administered 2016-09-24 (×3): 0.5 mg via INTRAVENOUS
  Filled 2016-09-24: qty 1

## 2016-09-24 NOTE — Progress Notes (Addendum)
Family member attempted to give small portion of black coffee via teaspoon per patient's request, with patient on upright position and PMV on but patient cough and not tolerating. This RN did trach suction and noted thick tan secretion.  Patient tolerated well the procedure and felt better.  Emphasized to family member not to give anything by mouth due to high aspiration risk.

## 2016-09-24 NOTE — Progress Notes (Signed)
PT Cancellation Note/Discharge  Patient Details Name: Michael AmenDavid T Rockhold MRN: [id number] DOB: 1945-10-14   Cancelled Treatment:    Reason Eval/Treat Not Completed: Other (comment). Pt now comfort care and transitioning to hospice. Pt denying vent and peg tube. Pt with no further acute PT needs at this time. PT SIGNING OFF.   [name] 09/24/2016, 12:32 PM   Lewis ShockAshly Johnay Mano, PT, DPT Pager #: [phone] Office #: [phone]

## 2016-09-24 NOTE — Progress Notes (Signed)
Pt resting comfortably with family at bedside.   Feeding tube is out & now comfort care.  No needs from pt or family at this time.  Doreatha MassedSamantha Rhyne, [facility] Center[facility] 09/24/2016 12:10 PM

## 2016-09-24 NOTE — Progress Notes (Signed)
Pt is resting comfortably at this time. Several Family members at bedside. No distress noted. tolerating ATC well.

## 2016-09-24 NOTE — Progress Notes (Signed)
Met with the family at bedside.  [name] is awake and able to communicate softly with his family.  He smiled during the conversation when his wife mentioned how much they loved the beach.  He has required the q 1hr dilaudid for increased respiratory effort.  Family is very accepting of the prognosis although sad.  His wife finds comfort in the fact that he was able to make his own decision regarding heroic measures to extend life.    Will consult with Dr. Hilma Favors regarding order changes for comfort.  Goal is full comfort.  Expected hospital death within days to a week.  It would be inappropriate and very distressful for the patient at any point to move OOF for care.  Will follow tomorrow and as needed for support of comfort measures at EOL.  Kizzie Fantasia, RN-BC, MSN, [facility] Palliative Clinical Specialist

## 2016-09-24 NOTE — Progress Notes (Signed)
OT Cancellation    09/24/16 1300  OT Visit Information  Last OT Received On 09/24/16  Reason Eval/Treat Not Completed Medical issues which prohibited therapy;Other (comment) (Pt now comfort care and transitioning to hospice. Pt denying vent and peg tube.Pt has no further acute OT needs at this time. Will sign off acute OT.)   Ameren CorporationCharis Sia Gabrielsen, OTR/L [phone]

## 2016-09-24 NOTE — Progress Notes (Signed)
Nutrition Brief Note  Chart reviewed. Pt now transitioning to comfort care.  No further nutrition interventions warranted at this time.  Please re-consult as needed.   Tamanna Whitson A. Kataya Guimont, RD, LDN, CDE Pager: [phone] After hours Pager: [phone]  

## 2016-09-24 NOTE — Consult Note (Signed)
[facility] CM Primary Care Navigator  09/24/2016  Michael Heath 12/30/1945 [id number]    Went to see patient at the bedside to identify possible discharge needs. Met with patient's wife and other family members at the bedside. Daughter in-law (Emily) reports that patient had expressed desire to get rid of the hospital equipments and tubes because he no longer wants to continue with aggressive treatments and patient stated he had "had enough".      Patient is now transitioning into hospice per MD note.  For additional questions please contact:   A. , BSN, RN-BC [facility] PRIMARY CARE Navigator Cell: [phone] 

## 2016-09-24 NOTE — Progress Notes (Signed)
   [facility] PCCM  Pt and wife spoke with Dr. Phillips Odor extensively on 3/6.  Pt is now full DNR, transitioning into hospice. He does NOT want vent or feeding tube.   PCCM will sign off for now. pls call back if with issues.    Pollie Meyer, MD 09/24/2016, 7:42 AM [facility] Pager [phone] After 3 pm or if no answer, call [phone]

## 2016-10-19 NOTE — Progress Notes (Signed)
   10/10/2016 0900  Clinical Encounter Type  Visited With Patient and family together  Visit Type Follow-up;Spiritual support  Referral From Nurse  Consult/Referral To Chaplain  Spiritual Encounters  Spiritual Needs Sacred text;Prayer;Emotional;Grief support  Stress Factors  Patient Stress Factors None identified  Family Stress Factors Exhausted;Family relationships;Loss    Chaplain responded to page for death of patient. Family at bedside, concerned about funeral arrangements. Spent time talking about grief and loss. Processed that pt was at peace when he passed. Gave number for pt placement. Read scripture, prayed with family, provided emotional support and ministry of presence. Lamoyne Hessel L. Salomon FickBanks, MDiv

## 2016-10-19 NOTE — Progress Notes (Signed)
Wasted remainder of hydromorphone drip down the sink, approx 3-805mL, witnessed by Wyman SongsterKristie Beckner RN.

## 2016-10-19 NOTE — Discharge Summary (Signed)
AAA Discharge Summary    Michael Heath 1945-11-20 71 y.o. male  [id number]  Admission Date: 08/25/2016  Discharge Date: 2016-10-10  Physician: Dr. Trula Slade  Admission Diagnosis: Ruptured abdominal aortic aneurysm (AAA) [facility]) [I71.3]   HPI:   This is a 71 y.o. male who presented with a one-day history of right-sided flank pain that had been getting gradually worse for the past 24 hours.  A CTA scan was ordered which I reviewed and felt was consistent with a ruptured AAA.  I recommended that the patient be transported emergently to the [place] [facility] area by [facility].  He was taken straight to the OR and resuscitated and intubated.  Hospital Course:  The patient was admitted to the hospital and taken to the operating room on 09/01/2016 and underwent: Procedure:    #1: Endovascular repair of ruptured abdominal aortic aneurysm #2: Ultrasound-guided access, bilateral common femoral artery #3: Abdominal aortogram #4: Catheter aorta 2 #5: Bilateral iliac extension #6: Exploratory laparotomy #7: Evacuation of abdominal retroperitoneal hematoma #8: Placement of abdominal wound VAC    Intraoperative findings are as follows:  Initial imaging revealed an aortocaval fistula.  This was successfully excluded as was the ruptured aneurysm.  The patient developed an abdominal compartment syndrome which required exploratory laparotomy for decompression.  His abdomen could not be closed and therefore a abdominal VAC was placed  Devices used: Main body was primary left, Gore 34 x 14 x 14.  Ipsilateral extension was a Gore 12 x 7.  Contralateral right was a Gore 16 x 9.5 followed by a 16 x 11.5  At the end of the case, he had Doppler signals in both feet.  His legs are less mottled.  He was taken out of the operating room to the intensive care unit in critical condition.  The following is a summary of hospital events:  1. Acute hypoxic respiratory failure s/p Tstomy : Cont vent for the day for now.   Eventually, I think he will be a nocturnal vent as he remains deconditioned.  2. Atrial fibrillation: Amiodarone drip reseumed on 3/5. Plan to switch to PO in am. Ct heparin drip. Will defer to Cards and Surgery re: switching to PO meds.  3. 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 4. Sepsis: Secondary to C. difficile colitis. Off IV ABx, PO vanc only. Continuing enteric precautions. Observe diarrhea.  5. Possible healthcare associated pneumonia: No evidence of infection on repeat tracheostomy culture. (-) increase in secretions and o2 requirement.  Cultures remain (-).  Observe off abx for now.  6. Abdominal aortic aneurysm rupture: Status post repair. Postoperative management per vascular surgery. 7. Anemia:  No evidence of active bleeding. Trending cell counts daily with CBC while on systemic anticoagulation. 8. Acute renal failure: Stable. Continuing to trend urine output with Foley catheter. Monitoring electrolytes and renal function daily. Autodiurescing well.  9. Prolonged QTc:  telemetry. 10. Right upper extremity superficial venous thrombosis: Noted on vascular ultrasound. Keeping blood pressure cuff off that arm. 11. Right carotid artery stenosis: Mild. Resuming anticoagulation. 12. Delirium: Multifactorial.   Remeron at HS and prn versed and fentanyl IV pushes while on the vent.  13. Nutrition. MBS was done on 3/5 : In report " Cervical osteophytes and protrusion of posterior pharyngeal wall in initial image. SLP noted during examination pt with unilateral posterior pharyngeal protrusion which impeded epiglottic deflection and airway closure, leading to severe residue in the valleculae and posterior pharyngeal wall with all  consistencies (thin, honey-thick liquids and pureed), unilateral bolus flow, penetration/aspiration during and after the swallow. "  Realistically, pt needs a PEG tube. Mentioned it to pt and wife.   Palliative  care was following closely with the pt: Extensive discussion with patient on PMV along with his wife and family. He has initiated a discussion about his future-he does not desire to continue along an aggressive intervention trajectory given results of his swallow evaluation. 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 Feeding tube would not prevent aspiration but it would provide a route for medications and supplemental nutrition. We discussed this at length- he told me that he did not want another tube put in his body that he had "had enough". He acknowledged he had come so far but did not think he wanted the QOL that was ahead. He and his wife had a meaningful exchange and he told me "decision made" he knows it may only be a matter of days.  I discussed this transition with Dr. Trula Slade, Pharmacy and also with SLP in detail.  Will attempt PO meds-liquid or crushed with puree.  Amiodarone 287m BID, Metoprolol 548mTID and Eloquis- for now.  Allow for rest and comfort- this has been a long road for him and I have tried to re-frame courage for him so that this very difficult decision he has made is honored and that the end of his life can be on his own terms.He expressed gratitude for the care he has been provided and opportunity to have this closure with his family. Will continue to support in next steps.  For comfort: Hydromorphone q1 PRN Scheduled Nebs  On 09/24/16, Met with the family at bedside.  Mr. SmPrimms awake and able to communicate softly with his family.  He smiled during the conversation when his wife mentioned how much they loved the beach.  He has required the q 1hr dilaudid for increased respiratory effort.  Family is very accepting of the prognosis although sad.  His wife finds comfort in the fact that he was able to make his own decision regarding heroic  measures to extend life.    Will consult with Dr. GoHilma Favorsegarding order changes for comfort.  Goal is full comfort.  Expected hospital death within days to a week.  It would be inappropriate and very distressful for the patient at any point to move OOF for care.  Will follow tomorrow and as needed for support of comfort measures at EOL.  The pt passed away on 3/04-01-2017 CBC    Component Value Date/Time   WBC 16.9 (H) 09/23/2016 0335   RBC 2.69 (L) 09/23/2016 0335   HGB 8.5 (L) 09/23/2016 0335   HCT 27.8 (L) 09/23/2016 0335   HCT 47.6 05/01/2016 1001   PLT 211 09/23/2016 0335   PLT 230 05/01/2016 1001   MCV 103.3 (H) 09/23/2016 0335   MCV 93 05/01/2016 1001   MCH 31.6 09/23/2016 0335   MCHC 30.6 09/23/2016 0335   RDW 22.1 (H) 09/23/2016 0335   RDW 13.0 05/01/2016 1001   LYMPHSABS 1.9 09/23/2016 0335   LYMPHSABS 2.6 05/01/2016 1001   MONOABS 1.9 (H) 09/23/2016 0335   EOSABS 0.2 09/23/2016 0335   EOSABS 0.6 (H) 05/01/2016 1001   BASOSABS 0.0 09/23/2016 0335   BASOSABS 0.1 05/01/2016 1001    BMET    Component Value Date/Time  NA 145 09/23/2016 0335   NA 134 08/25/2016 1335   K 3.2 (L) 09/23/2016 0335   CL 120 (H) 09/23/2016 0335   CO2 21 (L) 09/23/2016 0335   GLUCOSE 148 (H) 09/23/2016 0335   BUN 34 (H) 09/23/2016 0335   BUN 26 08/25/2016 1335   CREATININE 1.09 09/23/2016 0335   CALCIUM 7.5 (L) 09/23/2016 0335   GFRNONAA >60 09/23/2016 0335   GFRAA >60 09/23/2016 0335    Discharge Diagnosis:  Ruptured abdominal aortic aneurysm (AAA) ([facility]) [I71.3]  Secondary Diagnosis: Patient Active Problem List   Diagnosis Date Noted  . Tracheostomy care ([place])   . Acute encephalopathy   . Acute on chronic respiratory failure ([place])   . PAF (paroxysmal atrial fibrillation) ([place])   . Cerebral thrombosis with cerebral infarction 09/11/2016  . Acute respiratory failure with hypoxia ([place])   . AKI (acute kidney injury) ([place])   . Dysphagia   . Elevated troponin   . Coronary  artery calcification seen on CAT scan   . Ruptured abdominal aortic aneurysm (AAA) ([place]) 09/04/2016  . TIA (transient ischemic attack) 09/01/2016  . CKD (chronic kidney disease), stage III 01/28/2016  . HLD (hyperlipidemia) 12/27/2015  . Vitamin D deficiency 12/27/2015  . Elevated blood pressure reading without diagnosis of hypertension 12/25/2015  . Obesity 12/25/2015  . Healthcare maintenance 12/25/2015   Past Medical History:  Diagnosis Date  . C. difficile colitis 09/16/2016    Patient's condition: is deceased   Leontine Locket, PA-C [facility] [phone] 09/28/2016  9:41 AM

## 2016-10-19 NOTE — Progress Notes (Signed)
Patient has ceased breathing and has no audible heart sounds. Dr. Phillips OdorGolding notified, and the attending has been paged. Family at bedside.

## 2016-10-19 DEATH — deceased

## 2016-12-22 NOTE — Addendum Note (Signed)
Addendum  created 12/22/16 1115 by Eilene Ghaziose, Daylee Delahoz, MD   Sign clinical note

## 2016-12-22 NOTE — Anesthesia Postprocedure Evaluation (Signed)
Anesthesia Post Note  Patient: Michael AmenDavid T Gartley  Procedure(s) Performed: Procedure(s) (LRB): EXPLORATORY LAPAROTOMY (N/A)     Anesthesia Post Evaluation  Last Vitals:  Vitals:   09/24/16 1604 07/02/2017 0330  BP: (!) 169/91   Pulse:  (!) 105  Resp:  (!) 22  Temp:      Last Pain:  Vitals:   09/24/16 1401  TempSrc:   PainSc: 3                  Afsana Liera S

## 2017-12-21 IMAGING — DX DG CHEST 1V PORT
1 series · 1 of 1 positions shown · non-contrast
Comparison: 09/12/2016 and CT chest 09/02/2016.

CLINICAL DATA: Abnormal respiration.

EXAM:
PORTABLE CHEST 1 VIEW

[chest ap]
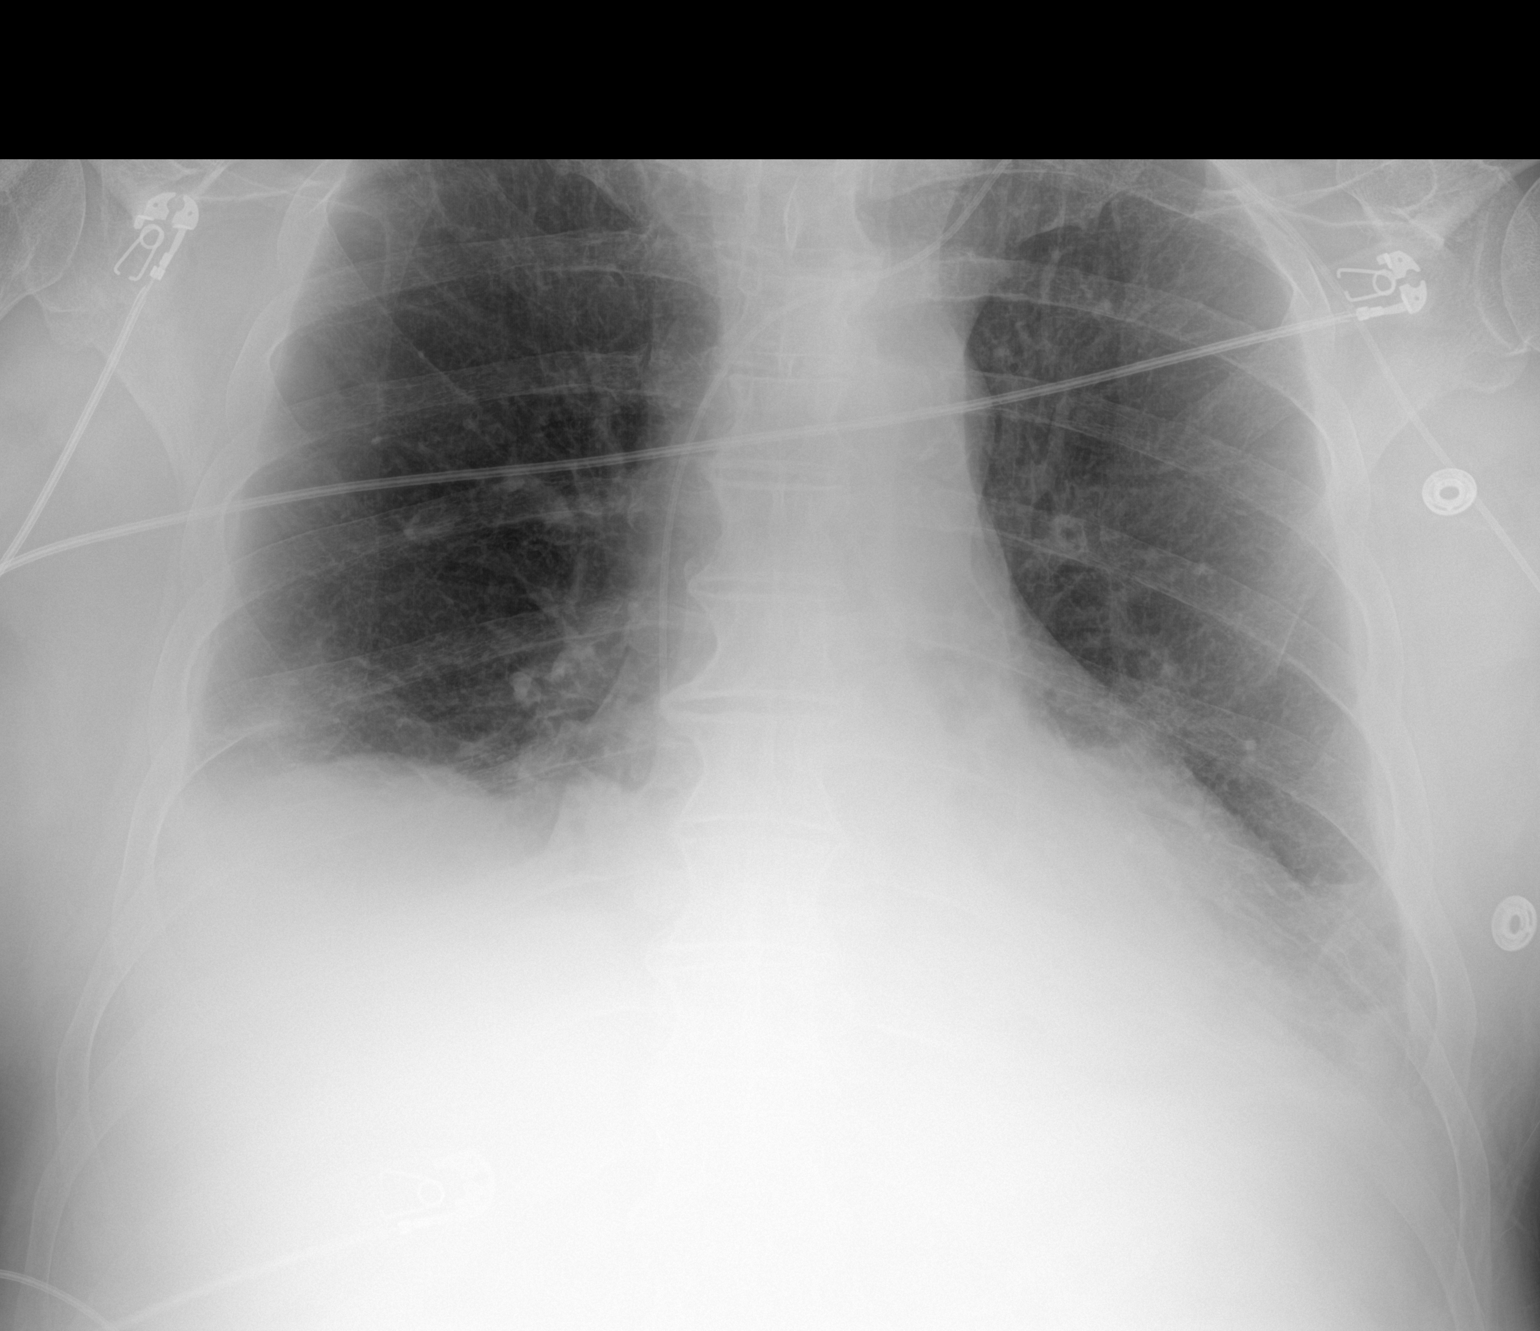

[1 of 1 positions shown; findings below may reference images not displayed]

FINDINGS: Trachea is midline. Interval extubation. Left PICC tip projects over
the SVC RA junction or high right atrium. Heart is enlarged, stable.
Minimal left basilar volume loss and a tiny bilateral pleural
effusions.
IMPRESSION: Minimal bibasilar volume loss and tiny bilateral pleural effusions.

## 2017-12-23 IMAGING — CR DG CHEST 1V PORT
1 series · 1 of 1 positions shown · non-contrast
Comparison: 09/14/2016 and earlier.

CLINICAL DATA: 70-year-old male status post Ruptured abdominal
aortic aneurysm. shortness of breath and acute on chronic
respiratory failure. Initial encounter.

EXAM:
PORTABLE CHEST 1 VIEW

[AP]
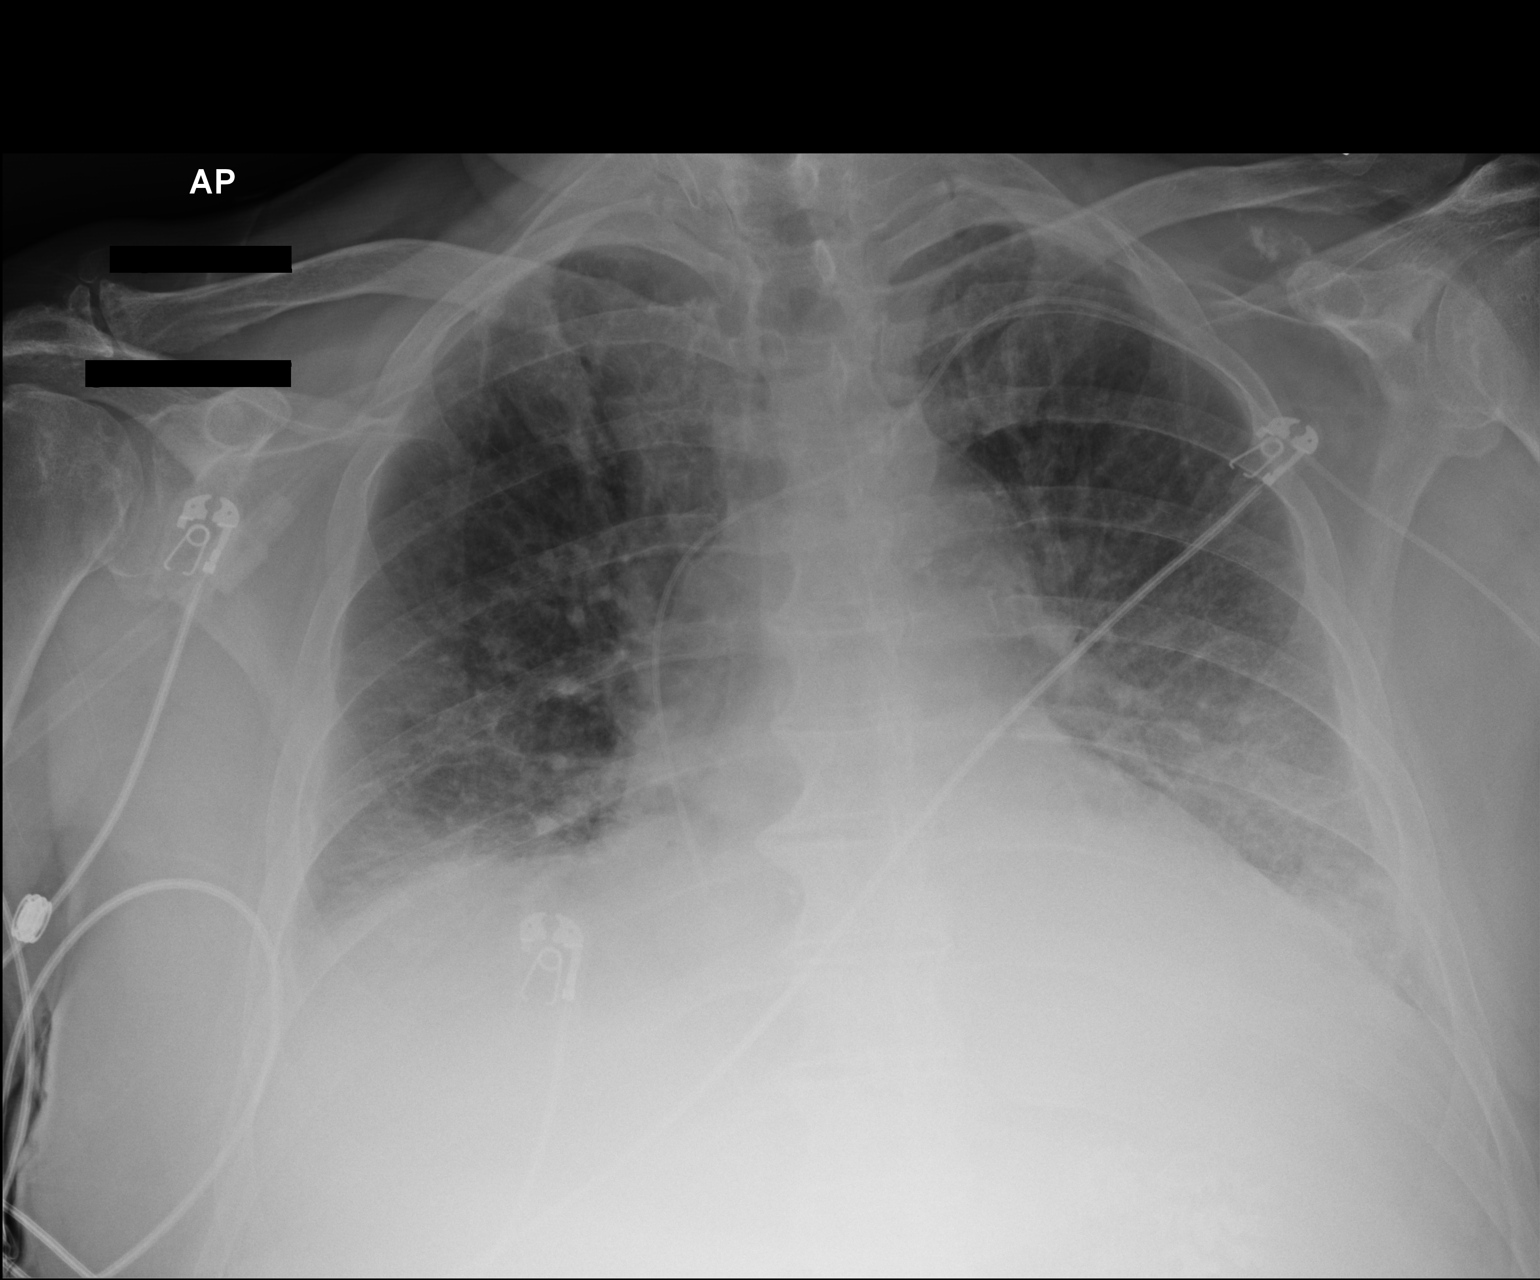

[1 of 1 positions shown; findings below may reference images not displayed]

FINDINGS: Portable AP semi upright view at 3851 hours. Increased veiling
opacity at both lung bases. Continued obscuration of the left
hemidiaphragm. The focal patchy opacity at the right lung base seen
on 09/11/2016 remains regressed. Stable cardiac size and mediastinal
contours. Calcified aortic atherosclerosis. Stable left PICC line.
No pneumothorax. No acute pulmonary edema.
IMPRESSION: 1. Bibasilar pulmonary opacity, favor a combination of pleural
effusions and atelectasis/consolidation.
2. No new cardiopulmonary abnormality.

## 2017-12-23 IMAGING — DX DG CHEST 1V PORT
1 series · 1 of 1 positions shown · non-contrast
Comparison: 09/15/2016

CLINICAL DATA: Patient intubated 1 hour ago.

EXAM:
PORTABLE CHEST 1 VIEW

[chest ap]
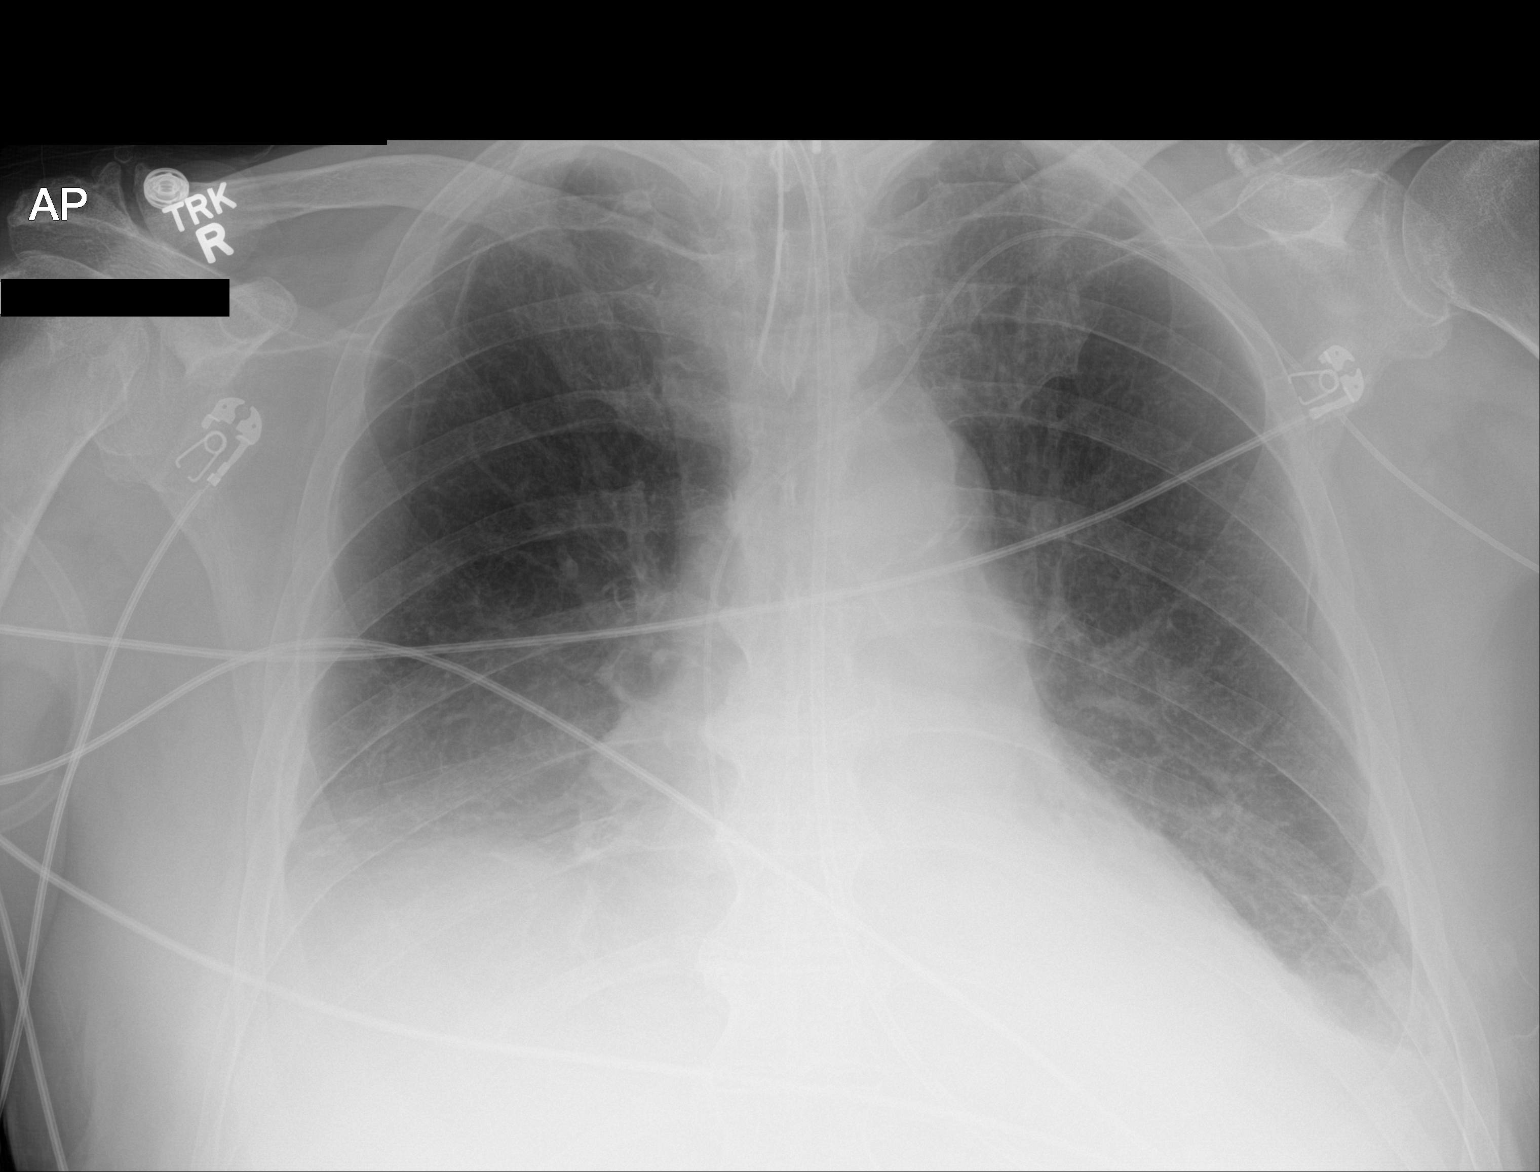

[1 of 1 positions shown; findings below may reference images not displayed]

FINDINGS: Endotracheal tube with the tip 3.4 cm above the carina.

Feeding tube coursing below the diaphragm with the tip not
visualized. Left-sided PICC line with the tip projecting over the
cavoatrial junction.

Bilateral small pleural effusions. Bilateral airspace disease likely
reflecting atelectasis. No pneumothorax. Stable cardiomediastinal
silhouette. No acute osseous abnormality. Degenerative changes of
bilateral acromioclavicular joints, right worse than left.
IMPRESSION: 1. Endotracheal tube with the tip 3.4 cm above the carina.
2. Stable small bilateral pleural effusions and bibasilar
atelectasis.

## 2017-12-23 IMAGING — CR DG ABD PORTABLE 1V
2 series · 2 of 2 positions shown · non-contrast
Comparison: Radiographs 09/02/2016.

CLINICAL DATA: Feeding tube placement.

EXAM:
PORTABLE ABDOMEN - 1 VIEW

[AP (1 of 2)]
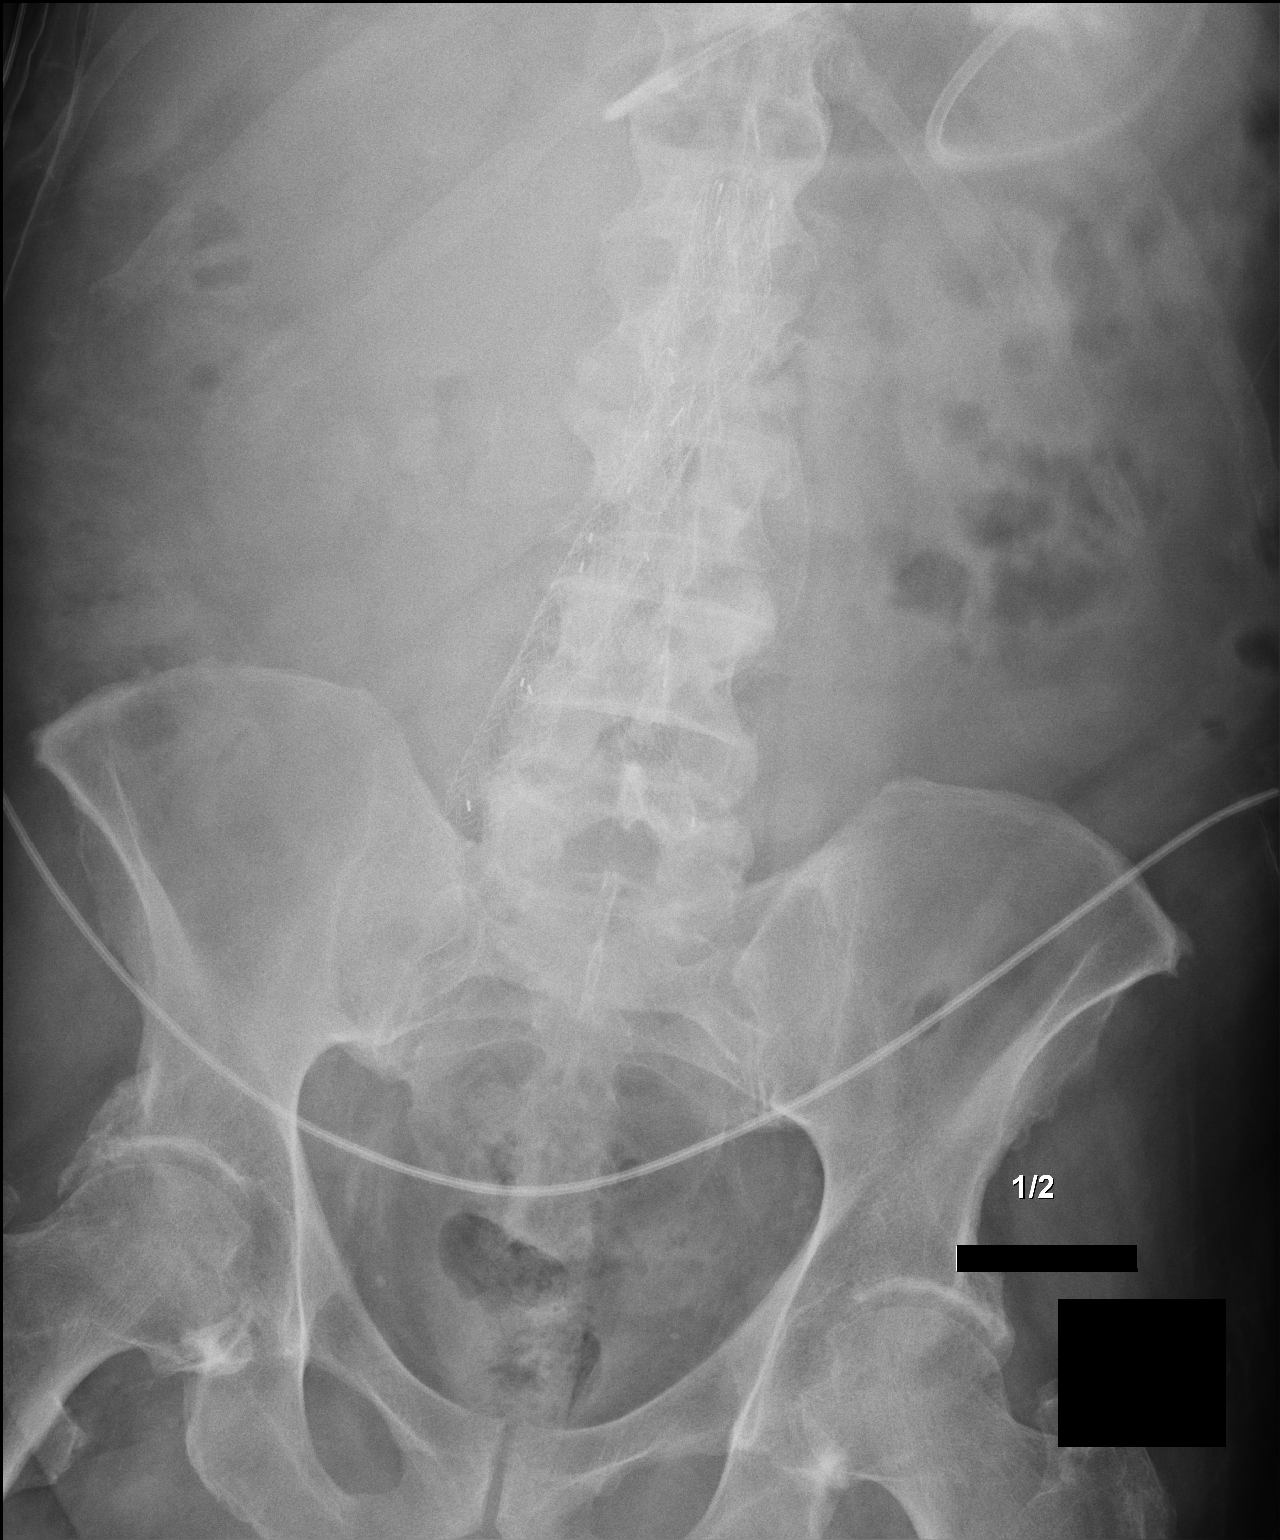

[AP (2 of 2)]
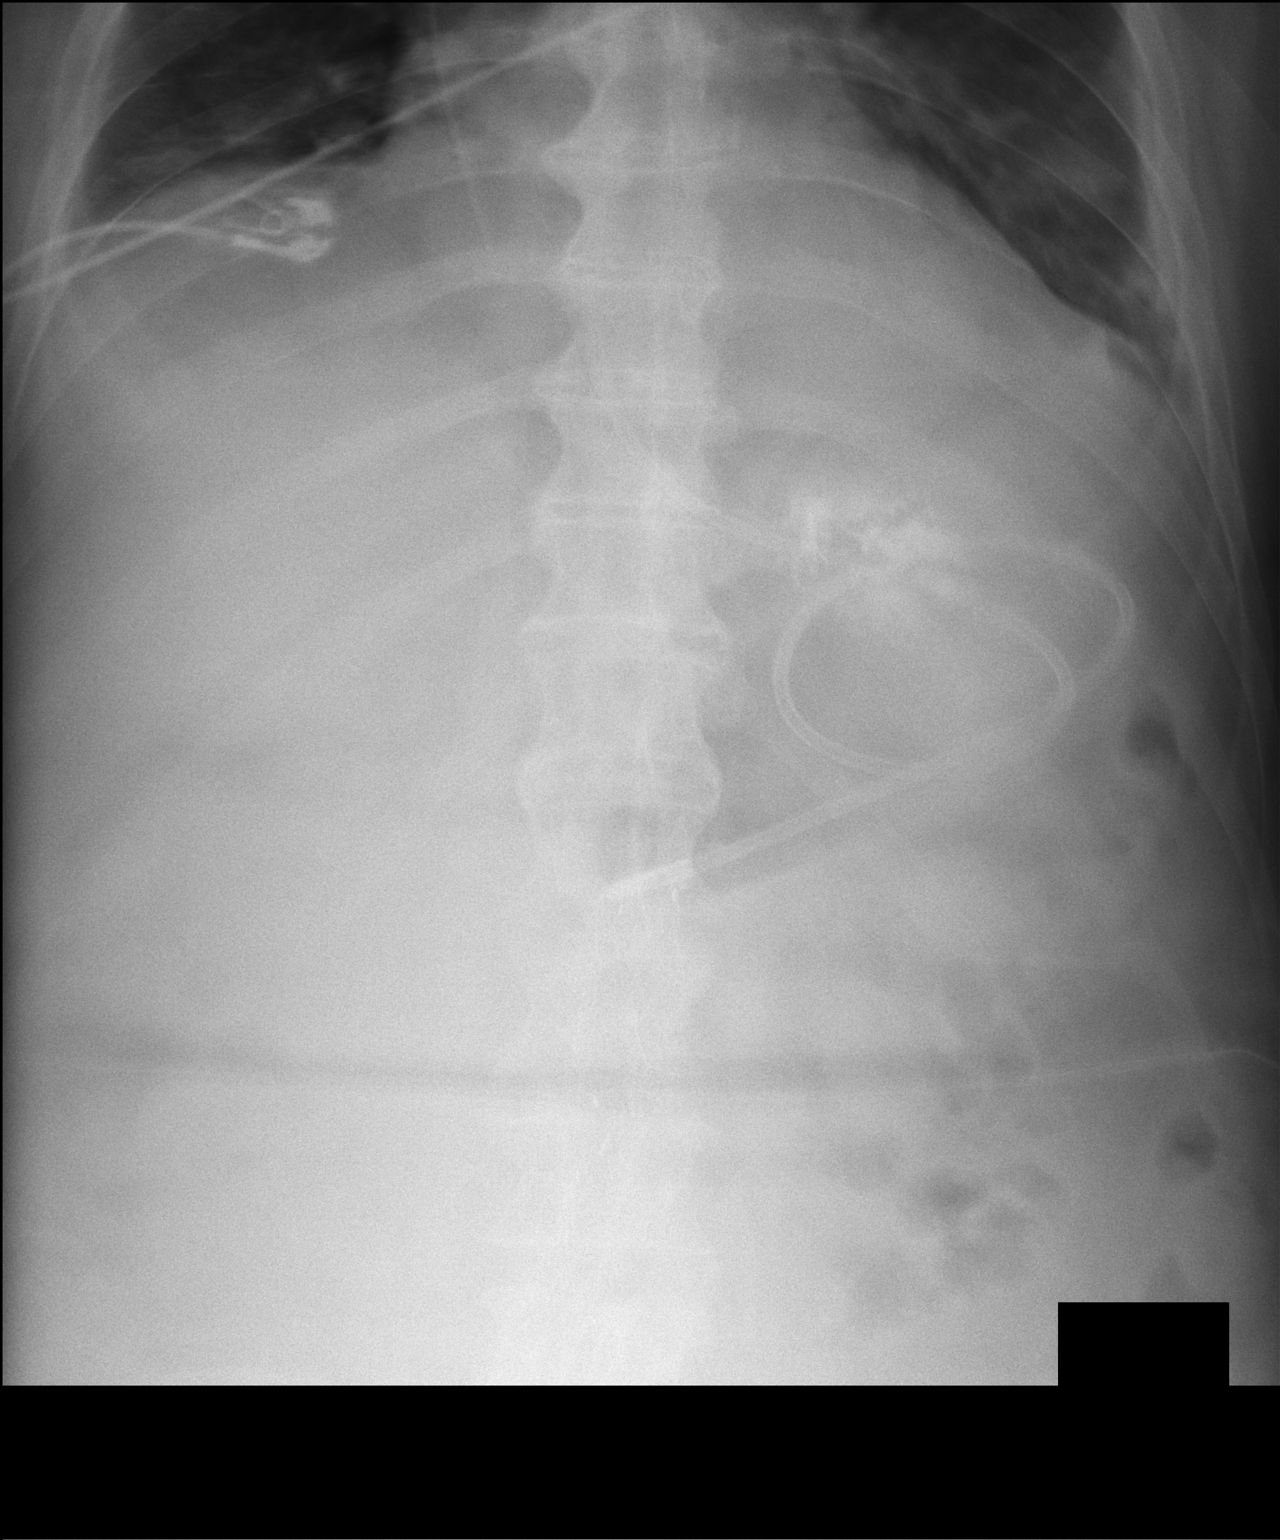

[2 of 2 positions shown; findings below may reference images not displayed]

FINDINGS: [id number] hours. Two views submitted. There is a feeding tube looped in
the stomach with the tip projected towards the pylorus. A small
amount of contrast is present within the gastric fundus. The
visualized bowel gas pattern is nonobstructive. Bibasilar
atelectasis, probable small pleural effusions and aortic stent graft
are noted.
IMPRESSION: Feeding tube tip in the distal stomach.
